# Patient Record
Sex: Male | Born: 1980 | Race: Black or African American | Hispanic: No | Smoking: Current every day smoker
Health system: Southern US, Community
[De-identification: ages and names within clinical notes are randomized; demographics above are authoritative.]

## PROBLEM LIST (undated history)

## (undated) DIAGNOSIS — F329 Major depressive disorder, single episode, unspecified: Secondary | ICD-10-CM

## (undated) DIAGNOSIS — F32A Depression, unspecified: Secondary | ICD-10-CM

## (undated) DIAGNOSIS — IMO0002 Reserved for concepts with insufficient information to code with codable children: Secondary | ICD-10-CM

## (undated) DIAGNOSIS — Z8614 Personal history of Methicillin resistant Staphylococcus aureus infection: Secondary | ICD-10-CM

## (undated) DIAGNOSIS — G4730 Sleep apnea, unspecified: Secondary | ICD-10-CM

## (undated) DIAGNOSIS — Z87898 Personal history of other specified conditions: Secondary | ICD-10-CM

## (undated) DIAGNOSIS — E1165 Type 2 diabetes mellitus with hyperglycemia: Secondary | ICD-10-CM

## (undated) DIAGNOSIS — S61219A Laceration without foreign body of unspecified finger without damage to nail, initial encounter: Secondary | ICD-10-CM

## (undated) DIAGNOSIS — G43909 Migraine, unspecified, not intractable, without status migrainosus: Secondary | ICD-10-CM

## (undated) DIAGNOSIS — K219 Gastro-esophageal reflux disease without esophagitis: Secondary | ICD-10-CM

## (undated) DIAGNOSIS — I10 Essential (primary) hypertension: Secondary | ICD-10-CM

## (undated) DIAGNOSIS — L309 Dermatitis, unspecified: Secondary | ICD-10-CM

## (undated) HISTORY — DX: Essential (primary) hypertension: I10

## (undated) HISTORY — DX: Type 2 diabetes mellitus with hyperglycemia: E11.65

## (undated) HISTORY — DX: Reserved for concepts with insufficient information to code with codable children: IMO0002

## (undated) HISTORY — DX: Migraine, unspecified, not intractable, without status migrainosus: G43.909

## (undated) HISTORY — DX: Major depressive disorder, single episode, unspecified: F32.9

## (undated) HISTORY — DX: Gastro-esophageal reflux disease without esophagitis: K21.9

## (undated) HISTORY — DX: Depression, unspecified: F32.A

---

## 2001-01-07 HISTORY — PX: ORBITAL FRACTURE SURGERY: SHX725

## 2012-07-08 ENCOUNTER — Encounter (HOSPITAL_COMMUNITY): Payer: Self-pay | Admitting: Emergency Medicine

## 2012-07-08 ENCOUNTER — Emergency Department (INDEPENDENT_AMBULATORY_CARE_PROVIDER_SITE_OTHER)
Admission: EM | Admit: 2012-07-08 | Discharge: 2012-07-08 | Disposition: A | Discharge status: Home / Self Care | Payer: Self-pay | Source: Home / Self Care | Attending: Emergency Medicine | Admitting: Emergency Medicine

## 2012-07-08 DIAGNOSIS — K529 Noninfective gastroenteritis and colitis, unspecified: Secondary | ICD-10-CM

## 2012-07-08 DIAGNOSIS — K5289 Other specified noninfective gastroenteritis and colitis: Secondary | ICD-10-CM

## 2012-07-08 HISTORY — DX: Sleep apnea, unspecified: G47.30

## 2012-07-08 LAB — POCT I-STAT, CHEM 8
BUN: 11 mg/dL (ref 6–23)
Calcium, Ion: 1.19 mmol/L (ref 1.12–1.23)
Chloride: 102 mEq/L (ref 96–112)
Creatinine, Ser: 1.2 mg/dL (ref 0.50–1.35)
Glucose, Bld: 110 mg/dL — ABNORMAL HIGH (ref 70–99)

## 2012-07-08 LAB — CBC WITH DIFFERENTIAL/PLATELET
Eosinophils Relative: 4 % (ref 0–5)
HCT: 44.7 % (ref 39.0–52.0)
Hemoglobin: 15.2 g/dL (ref 13.0–17.0)
Lymphocytes Relative: 40 % (ref 12–46)
MCV: 87.1 fL (ref 78.0–100.0)
Monocytes Absolute: 0.4 10*3/uL (ref 0.1–1.0)
Monocytes Relative: 6 % (ref 3–12)
Neutro Abs: 3.6 10*3/uL (ref 1.7–7.7)
WBC: 7 10*3/uL (ref 4.0–10.5)

## 2012-07-08 LAB — POCT RAPID STREP A: Streptococcus, Group A Screen (Direct): NEGATIVE

## 2012-07-08 MED ORDER — AZITHROMYCIN 250 MG PO TABS: 1000.0000 mg | ORAL_TABLET | Freq: Once | ORAL | Status: DC

## 2012-07-08 MED ORDER — CEFTRIAXONE SODIUM 250 MG IJ SOLR: 500.0000 mg | Freq: Once | INTRAMUSCULAR | Status: DC

## 2012-07-08 MED ORDER — DIPHENOXYLATE-ATROPINE 2.5-0.025 MG PO TABS: 1.0000 | ORAL_TABLET | Freq: Four times a day (QID) | ORAL | Status: DC | PRN

## 2012-07-08 MED ORDER — ACETAMINOPHEN-CODEINE #3 300-30 MG PO TABS: 1.0000 | ORAL_TABLET | Freq: Four times a day (QID) | ORAL | Status: DC | PRN

## 2012-07-08 NOTE — ED Provider Notes (Signed)
History    CSN: [id number] Arrival date & time 07/08/12  7829  First MD Initiated Contact with Patient 07/08/12 1105     Chief Complaint  Patient presents with  . Facial Pain   (Consider location/radiation/quality/duration/timing/severity/associated sxs/prior Treatment) HPI Comments: Patient presents urgent care describing that since Wednesday he's been having a sore throat. Has been feeling increasingly tired with bodyaches and yesterday had a single episode of explosive liquidy diarrhea. This is his has had some upper abdominal discomfort.  Patient denies any cough, shortness of breath or chest pain. He does however describe a minimal runny or congested nose. Patient denies any abdominal pain at rest.  Patient is a 32 y.o. male presenting with pharyngitis.  Sore Throat This is a new problem. The problem occurs constantly. The problem has not changed since onset.Associated symptoms include abdominal pain. Pertinent negatives include no chest pain, no headaches and no shortness of breath. The symptoms are aggravated by swallowing. Nothing relieves the symptoms. He has tried nothing for the symptoms.   Past Medical History  Diagnosis Date  . Sleep apnea    Past Surgical History  Procedure Laterality Date  . Orbital fracture surgery     History reviewed. No pertinent family history. History  Substance Use Topics  . Smoking status: Not on file  . Smokeless tobacco: Not on file  . Alcohol Use: Not on file    Review of Systems  Constitutional: Positive for chills, appetite change and fatigue. Negative for activity change.  HENT: Positive for congestion and sore throat. Negative for facial swelling, rhinorrhea, trouble swallowing, neck pain, neck stiffness, voice change and postnasal drip.   Respiratory: Negative for cough and shortness of breath.   Cardiovascular: Negative for chest pain, palpitations and leg swelling.  Gastrointestinal: Positive for abdominal pain and diarrhea.  Negative for vomiting and rectal pain.  Genitourinary: Negative for dysuria.  Skin: Negative for rash.  Neurological: Negative for headaches.    Allergies  Shellfish allergy  Home Medications   Current Outpatient Rx  Name  Route  Sig  Dispense  Refill  . acetaminophen-codeine (TYLENOL #3) 300-30 MG per tablet   Oral   Take 1-2 tablets by mouth every 6 (six) hours as needed for pain.   15 tablet   0   . diphenoxylate-atropine (LOMOTIL) 2.5-0.025 MG per tablet   Oral   Take 1 tablet by mouth 4 (four) times daily as needed for diarrhea or loose stools.   15 tablet   0    BP 137/100  Pulse 69  Temp(Src) 97.8 F (36.6 C) (Oral)  Resp 20  SpO2 99% Physical Exam  Nursing note and vitals reviewed. Constitutional: Vital signs are normal. He appears well-developed and well-nourished.  Non-toxic appearance.  HENT:  Head: Normocephalic.  Mouth/Throat: Uvula is midline. Posterior oropharyngeal erythema present. No oropharyngeal exudate, posterior oropharyngeal edema or tonsillar abscesses.  Eyes: Conjunctivae are normal.  Neck: Neck supple. No JVD present.  Cardiovascular: Normal rate and regular rhythm.  Exam reveals no gallop and no friction rub.   No murmur heard. Pulmonary/Chest: Effort normal and breath sounds normal. He has no decreased breath sounds. He has no wheezes. He has no rhonchi. He has no rales.  Abdominal: Soft. Normal appearance. There is no hepatosplenomegaly. There is no tenderness. There is no rigidity, no rebound, no CVA tenderness, no tenderness at McBurney's point and negative Murphy's sign.  Musculoskeletal: Normal range of motion.  Neurological: He is alert.  Skin: No rash noted. No  erythema.    ED Course  Procedures (including critical care time) Labs Reviewed  POCT I-STAT, CHEM 8 - Abnormal; Notable for the following:    Glucose, Bld 110 (*)    All other components within normal limits  CULTURE, GROUP A STREP  CBC WITH DIFFERENTIAL  POCT RAPID  STREP A ([facility] ONLY)   No results found. 1. Gastroenteritis     MDM  Patient afebrile, looks comfortable. Normal electrolytes. Symptomatic management encourage for the next 48 hours. Patient provided with a prescription of Zofran and Lomotil. Instructed and discussed symptoms that should return for further evaluation.  Jimmie Molly, MD 07/08/12 [phone]

## 2012-07-08 NOTE — ED Notes (Signed)
Triaged by tami Dora Sims student

## 2012-07-08 NOTE — ED Notes (Signed)
C/o runny nose, sore throat, tired and weak, had explosive diarrhea last night and this morning, Pt denies any fever.  Has had some cold chills and mild stomach pain.  Had tried OTC meds with no relief Leilani Able CMA Student

## 2012-07-10 LAB — CULTURE, GROUP A STREP

## 2013-08-17 ENCOUNTER — Ambulatory Visit (INDEPENDENT_AMBULATORY_CARE_PROVIDER_SITE_OTHER): Payer: BC Managed Care – PPO | Admitting: Physician Assistant

## 2013-08-17 ENCOUNTER — Encounter: Payer: Self-pay | Admitting: Physician Assistant

## 2013-08-17 VITALS — BP 120/80 | HR 72 | Temp 98.9°F | Resp 18 | Ht 69.5 in | Wt 295.0 lb

## 2013-08-17 DIAGNOSIS — R7309 Other abnormal glucose: Secondary | ICD-10-CM

## 2013-08-17 DIAGNOSIS — I10 Essential (primary) hypertension: Secondary | ICD-10-CM

## 2013-08-17 DIAGNOSIS — Z0000 Encounter for general adult medical examination without abnormal findings: Secondary | ICD-10-CM

## 2013-08-17 DIAGNOSIS — R739 Hyperglycemia, unspecified: Secondary | ICD-10-CM

## 2013-08-17 LAB — LIPID PANEL
CHOL/HDL RATIO: 6
Cholesterol: 214 mg/dL — ABNORMAL HIGH (ref 0–200)
HDL: 37.2 mg/dL — AB (ref >39.00)
LDL Cholesterol: 151 mg/dL — ABNORMAL HIGH (ref 0–99)
NONHDL: 176.8
TRIGLYCERIDES: 130 mg/dL (ref 0.0–149.0)
VLDL: 26 mg/dL (ref 0.0–40.0)

## 2013-08-17 LAB — HEPATIC FUNCTION PANEL
ALT: 23 U/L (ref 0–53)
AST: 17 U/L (ref 0–37)
Albumin: 4.1 g/dL (ref 3.5–5.2)
Alkaline Phosphatase: 54 U/L (ref 39–117)
Bilirubin, Direct: 0 mg/dL (ref 0.0–0.3)
Total Bilirubin: 0.7 mg/dL (ref 0.2–1.2)
Total Protein: 7.9 g/dL (ref 6.0–8.3)

## 2013-08-17 LAB — POCT URINALYSIS DIPSTICK
BILIRUBIN UA: NEGATIVE
GLUCOSE UA: NEGATIVE
Ketones, UA: NEGATIVE
Leukocytes, UA: NEGATIVE
NITRITE UA: NEGATIVE
Protein, UA: NEGATIVE
Spec Grav, UA: <=1.005
UROBILINOGEN UA: 0.2
pH, UA: 5.5

## 2013-08-17 LAB — CBC WITH DIFFERENTIAL/PLATELET
BASOS ABS: 0 10*3/uL (ref 0.0–0.1)
Basophils Relative: 0.4 % (ref 0.0–3.0)
EOS ABS: 0.2 10*3/uL (ref 0.0–0.7)
Eosinophils Relative: 2.9 % (ref 0.0–5.0)
HCT: 46.7 % (ref 39.0–52.0)
Hemoglobin: 15.5 g/dL (ref 13.0–17.0)
LYMPHS PCT: 26.3 % (ref 12.0–46.0)
Lymphs Abs: 1.9 10*3/uL (ref 0.7–4.0)
MCHC: 33.3 g/dL (ref 30.0–36.0)
MCV: 90.4 fl (ref 78.0–100.0)
MONO ABS: 0.5 10*3/uL (ref 0.1–1.0)
Monocytes Relative: 6.6 % (ref 3.0–12.0)
NEUTROS PCT: 63.8 % (ref 43.0–77.0)
Neutro Abs: 4.7 10*3/uL (ref 1.4–7.7)
Platelets: 422 10*3/uL — ABNORMAL HIGH (ref 150.0–400.0)
RBC: 5.16 Mil/uL (ref 4.22–5.81)
RDW: 13.4 % (ref 11.5–15.5)
WBC: 7.3 10*3/uL (ref 4.0–10.5)

## 2013-08-17 LAB — BASIC METABOLIC PANEL WITH GFR
BUN: 12 mg/dL (ref 6–23)
CO2: 27 meq/L (ref 19–32)
Calcium: 9.4 mg/dL (ref 8.4–10.5)
Chloride: 99 meq/L (ref 96–112)
Creatinine, Ser: 1.3 mg/dL (ref 0.4–1.5)
GFR: 83.88 mL/min
Glucose, Bld: 145 mg/dL — ABNORMAL HIGH (ref 70–99)
Potassium: 3.9 meq/L (ref 3.5–5.1)
Sodium: 135 meq/L (ref 135–145)

## 2013-08-17 LAB — GLUCOSE, POCT (MANUAL RESULT ENTRY): POC Glucose: 140 mg/dl — AB (ref 70–99)

## 2013-08-17 NOTE — Progress Notes (Signed)
Subjective:    Patient ID: Joseph Barry, male    DOB: 10-30-1980, 33 y.o.   MRN: [id number]  HPI Patient presents to clinic today to establish care.  Acute Concerns: Pt establishing because of recent UC visit where he was discovered to have an elevated blood sugar of 169 last week. He was told to find a PCP to have this monitored more closely.  Annual Exam.  Chronic Issues: HTN- Stable on lisinopril-hctz. He is tolerating this medication well and denies adverse effects.  Headaches, migraines- States he has had his whole life. Unilateral headache. States he also experiences photophobia ad phonophobia. In the past he has even vomited from these. He states that he used to take tylenol for this, however it is not effective. He gets these maybe once a month. He states that they resolve with adequate rest.  Health Maintenance: Dental -- Has dentist in [place]. Vision -- Rx glasses, has eye exam yearly. Immunizations -- Needs Tetanus. Colonoscopy -- No family hx of colon cancer.    Review of Systems Patient denies fevers, chills, nausea, vomiting, diarrhea, chest pain, shortness of breath, orthopnea, syncope. Denies lower extremity edema, abdominal pain, change in appetite, change in bowel movements. Patient denies rashes, musculoskeletal complaints. No other specific complaints in a complete review of systems.   Past Medical History  Diagnosis Date  . Sleep apnea   . Chicken pox   . Frequent headaches   . GERD (gastroesophageal reflux disease)   . Heart murmur   . Hypertension   . Migraines     History   Social History  . Marital Status: Married    Spouse Name: N/A    Number of Children: N/A  . Years of Education: N/A   Occupational History  . Not on file.   Social History Main Topics  . Smoking status: Current Some Day Smoker    Types: Cigars  . Smokeless tobacco: Not on file  . Alcohol Use: Yes     Comment: occ  . Drug Use: No  . Sexual Activity: Not on  file   Other Topics Concern  . Not on file   Social History Narrative  . No narrative on file    Past Surgical History  Procedure Laterality Date  . Orbital fracture surgery      Family History  Problem Relation Age of Onset  . Arthritis Maternal Grandmother   . Hypertension Father   . Hypertension Mother   . Diabetes Father   . Heart disease Mother     Allergies  Allergen Reactions  . Shellfish Allergy     No current outpatient prescriptions on file prior to visit.   No current facility-administered medications on file prior to visit.    EXAM: BP 120/80  Pulse 72  Temp(Src) 98.9 F (37.2 C) (Oral)  Resp 18  Ht 5' 9.5" (1.765 m)  Wt 295 lb (133.811 kg)  BMI 42.95 kg/m2     Objective:   Physical Exam  Nursing note and vitals reviewed. Constitutional: He is oriented to person, place, and time. He appears well-developed and well-nourished. No distress.  HENT:  Head: Normocephalic and atraumatic.  Right Ear: External ear normal.  Left Ear: External ear normal.  Nose: Nose normal.  Mouth/Throat: Oropharynx is clear and moist. No oropharyngeal exudate.  bliat TMs normal.  Eyes: Conjunctivae and EOM are normal. Pupils are equal, round, and reactive to light. Right eye exhibits no discharge. Left eye exhibits no discharge. No scleral icterus.  Neck: Normal range of motion. Neck supple. No JVD present. No tracheal deviation present. No thyromegaly present.  Cardiovascular: Normal rate, regular rhythm, normal heart sounds and intact distal pulses.  Exam reveals no gallop and no friction rub.   No murmur heard. Pulmonary/Chest: Effort normal and breath sounds normal. No stridor. No respiratory distress. He has no wheezes. He has no rales. He exhibits no tenderness.  Abdominal: Soft. Bowel sounds are normal. He exhibits no distension and no mass. There is no tenderness. There is no rebound and no guarding.  Musculoskeletal: Normal range of motion. He exhibits no edema  and no tenderness.  Lymphadenopathy:    He has no cervical adenopathy.  Neurological: He is alert and oriented to person, place, and time. He has normal reflexes. No cranial nerve deficit. He exhibits normal muscle tone. Coordination normal.  Skin: Skin is warm and dry. No rash noted. He is not diaphoretic. No erythema. No pallor.  Psychiatric: He has a normal mood and affect. His behavior is normal. Judgment and thought content normal.   Component     Latest Ref Rng 08/17/2013  POC Glucose     70 - 99 mg/dl 161140 (A)       Assessment & Plan:  Casimiro NeedleMichael was seen today for establish care.  Diagnoses and associated orders for this visit:  Annual physical exam - CBC with Differential - Lipid Panel - POC Urinalysis Dipstick - Basic Metabolic Panel - Hepatic Function Panel  Essential hypertension Comments: Stable on Lisinopril-HCTZ, will continue.  Hyperglycemia - POC Glucose (CBG)    Pt fasting Fingerstick blood glucose today elevated and recent a1c of 6.5% suggest that there is some underlying sugar problem. We will try to treat this primarily with focus on lifestyle modification, healthy, varied diet, and regular exercise.  Health Maintenance UTD.  Return precautions provided, and patient handout on health maintenance, diabetes and exercise, diabetes and food.  Plan to follow up in about 3 months to reassess, or for worsening or persistent symptoms despite treatment.  Patient Instructions  We will call with your lab results and any changes in therapy required based on them.  We will need to try and make lifestyle changes to monitor your sugar as you are likely pre-diabetic.   Make sure you limit your sugar intake and carbs and try and get regular exercise several times per week.  We will follow up on your elevated blood sugars in 3 months with a reassessment of your A1c.  If we are unable to control your blood sugar with diet and exercise, we will have to start medication  to treat.  If emergency symptoms discussed during visit developed, seek medical attention immediately.  Followup as needed, or for worsening or persistent symptoms despite treatment.

## 2013-08-17 NOTE — Patient Instructions (Addendum)
We will call with your lab results and any changes in therapy required based on them.  We will need to try and make lifestyle changes to monitor your sugar as you are likely pre-diabetic.   Make sure you limit your sugar intake and carbs and try and get regular exercise several times per week.  We will follow up on your elevated blood sugars in 3 months with a reassessment of your A1c.  If we are unable to control your blood sugar with diet and exercise, we will have to start medication to treat.  If emergency symptoms discussed during visit developed, seek medical attention immediately.  Followup as needed, or for worsening or persistent symptoms despite treatment.   Diabetes Mellitus and Food It is important for you to manage your blood sugar (glucose) level. Your blood glucose level can be greatly affected by what you eat. Eating healthier foods in the appropriate amounts throughout the day at about the same time each day will help you control your blood glucose level. It can also help slow or prevent worsening of your diabetes mellitus. Healthy eating may even help you improve the level of your blood pressure and reach or maintain a healthy weight.  HOW CAN FOOD AFFECT ME? Carbohydrates Carbohydrates affect your blood glucose level more than any other type of food. Your dietitian will help you determine how many carbohydrates to eat at each meal and teach you how to count carbohydrates. Counting carbohydrates is important to keep your blood glucose at a healthy level, especially if you are using insulin or taking certain medicines for diabetes mellitus. Alcohol Alcohol can cause sudden decreases in blood glucose (hypoglycemia), especially if you use insulin or take certain medicines for diabetes mellitus. Hypoglycemia can be a life-threatening condition. Symptoms of hypoglycemia (sleepiness, dizziness, and disorientation) are similar to symptoms of having too much alcohol.  If your health  care provider has given you approval to drink alcohol, do so in moderation and use the following guidelines:  Women should not have more than one drink per day, and men should not have more than two drinks per day. One drink is equal to:  12 oz of beer.  5 oz of wine.  1 oz of hard liquor.  Do not drink on an empty stomach.  Keep yourself hydrated. Have water, diet soda, or unsweetened iced tea.  Regular soda, juice, and other mixers might contain a lot of carbohydrates and should be counted. WHAT FOODS ARE NOT RECOMMENDED? As you make food choices, it is important to remember that all foods are not the same. Some foods have fewer nutrients per serving than other foods, even though they might have the same number of calories or carbohydrates. It is difficult to get your body what it needs when you eat foods with fewer nutrients. Examples of foods that you should avoid that are high in calories and carbohydrates but low in nutrients include:  Trans fats (most processed foods list trans fats on the Nutrition Facts label).  Regular soda.  Juice.  Candy.  Sweets, such as cake, pie, doughnuts, and cookies.  Fried foods. WHAT FOODS CAN I EAT? Have nutrient-rich foods, which will nourish your body and keep you healthy. The food you should eat also will depend on several factors, including:  The calories you need.  The medicines you take.  Your weight.  Your blood glucose level.  Your blood pressure level.  Your cholesterol level. You also should eat a variety of foods, including:  Protein, such as meat, poultry, fish, tofu, nuts, and seeds (lean animal proteins are best).  Fruits.  Vegetables.  Dairy products, such as milk, cheese, and yogurt (low fat is best).  Breads, grains, pasta, cereal, rice, and beans.  Fats such as olive oil, trans fat-free margarine, canola oil, avocado, and olives. DOES EVERYONE WITH DIABETES MELLITUS HAVE THE SAME MEAL PLAN? Because every  person with diabetes mellitus is different, there is not one meal plan that works for everyone. It is very important that you meet with a dietitian who will help you create a meal plan that is just right for you. Document Released: 09/20/2004 Document Revised: 12/29/2012 Document Reviewed: 11/20/2012 [facility] Patient Information 2015 [place], [place]. This information is not intended to replace advice given to you by your health care provider. Make sure you discuss any questions you have with your health care provider. Diabetes and Exercise Exercising regularly is important. It is not just about losing weight. It has many health benefits, such as:  Improving your overall fitness, flexibility, and endurance.  Increasing your bone density.  Helping with weight control.  Decreasing your body fat.  Increasing your muscle strength.  Reducing stress and tension.  Improving your overall health. People with diabetes who exercise gain additional benefits because exercise:  Reduces appetite.  Improves the body's use of blood sugar (glucose).  Helps lower or control blood glucose.  Decreases blood pressure.  Helps control blood lipids (such as cholesterol and triglycerides).  Improves the body's use of the hormone insulin by:  Increasing the body's insulin sensitivity.  Reducing the body's insulin needs.  Decreases the risk for heart disease because exercising:  Lowers cholesterol and triglycerides levels.  Increases the levels of good cholesterol (such as high-density lipoproteins [HDL]) in the body.  Lowers blood glucose levels. YOUR ACTIVITY PLAN  Choose an activity that you enjoy and set realistic goals. Your health care provider or diabetes educator can help you make an activity plan that works for you. Exercise regularly as directed by your health care provider. This includes:  Performing resistance training twice a week such as push-ups, sit-ups, lifting weights, or using  resistance bands.  Performing 150 minutes of cardio exercises each week such as walking, running, or playing sports.  Staying active and spending no more than 90 minutes at one time being inactive. Even short bursts of exercise are good for you. Three 10-minute sessions spread throughout the day are just as beneficial as a single 30-minute session. Some exercise ideas include:  Taking the dog for a walk.  Taking the stairs instead of the elevator.  Dancing to your favorite song.  Doing an exercise video.  Doing your favorite exercise with a friend. RECOMMENDATIONS FOR EXERCISING WITH TYPE 1 OR TYPE 2 DIABETES   Check your blood glucose before exercising. If blood glucose levels are greater than 240 mg/dL, check for urine ketones. Do not exercise if ketones are present.  Avoid injecting insulin into areas of the body that are going to be exercised. For example, avoid injecting insulin into:  The arms when playing tennis.  The legs when jogging.  Keep a record of:  Food intake before and after you exercise.  Expected peak times of insulin action.  Blood glucose levels before and after you exercise.  The type and amount of exercise you have done.  Review your records with your health care provider. Your health care provider will help you to develop guidelines for adjusting food intake and insulin amounts  before and after exercising.  If you take insulin or oral hypoglycemic agents, watch for signs and symptoms of hypoglycemia. They include:  Dizziness.  Shaking.  Sweating.  Chills.  Confusion.  Drink plenty of water while you exercise to prevent dehydration or heat stroke. Body water is lost during exercise and must be replaced.  Talk to your health care provider before starting an exercise program to make sure it is safe for you. Remember, almost any type of activity is better than none. Document Released: 03/16/2003 Document Revised: 05/10/2013 Document Reviewed:  06/02/2012 [facility] Patient Information 2015 [place], [place]. This information is not intended to replace advice given to you by your health care provider. Make sure you discuss any questions you have with your health care provider. Health Maintenance A healthy lifestyle and preventative care can promote health and wellness.  Maintain regular health, dental, and eye exams.  Eat a healthy diet. Foods like vegetables, fruits, whole grains, low-fat dairy products, and lean protein foods contain the nutrients you need and are low in calories. Decrease your intake of foods high in solid fats, added sugars, and salt. Get information about a proper diet from your health care provider, if necessary.  Regular physical exercise is one of the most important things you can do for your health. Most adults should get at least 150 minutes of moderate-intensity exercise (any activity that increases your heart rate and causes you to sweat) each week. In addition, most adults need muscle-strengthening exercises on 2 or more days a week.   Maintain a healthy weight. The body mass index (BMI) is a screening tool to identify possible weight problems. It provides an estimate of body fat based on height and weight. Your health care provider can find your BMI and can help you achieve or maintain a healthy weight. For males 20 years and older:  A BMI below 18.5 is considered underweight.  A BMI of 18.5 to 24.9 is normal.  A BMI of 25 to 29.9 is considered overweight.  A BMI of 30 and above is considered obese.  Maintain normal blood lipids and cholesterol by exercising and minimizing your intake of saturated fat. Eat a balanced diet with plenty of fruits and vegetables. Blood tests for lipids and cholesterol should begin at age 33 and be repeated every 5 years. If your lipid or cholesterol levels are high, you are over age 950, or you are at high risk for heart disease, you may need your cholesterol levels checked more  frequently.Ongoing high lipid and cholesterol levels should be treated with medicines if diet and exercise are not working.  If you smoke, find out from your health care provider how to quit. If you do not use tobacco, do not start.  Lung cancer screening is recommended for adults aged 55-80 years who are at high risk for developing lung cancer because of a history of smoking. A yearly low-dose CT scan of the lungs is recommended for people who have at least a 30-pack-year history of smoking and are current smokers or have quit within the past 15 years. A pack year of smoking is smoking an average of 1 pack of cigarettes a day for 1 year (for example, a 30-pack-year history of smoking could mean smoking 1 pack a day for 30 years or 2 packs a day for 15 years). Yearly screening should continue until the smoker has stopped smoking for at least 15 years. Yearly screening should be stopped for people who develop a health problem  that would prevent them from having lung cancer treatment.  If you choose to drink alcohol, do not have more than 2 drinks per day. One drink is considered to be 12 oz (360 mL) of beer, 5 oz (150 mL) of wine, or 1.5 oz (45 mL) of liquor.  Avoid the use of street drugs. Do not share needles with anyone. Ask for help if you need support or instructions about stopping the use of drugs.  High blood pressure causes heart disease and increases the risk of stroke. Blood pressure should be checked at least every 1-2 years. Ongoing high blood pressure should be treated with medicines if weight loss and exercise are not effective.  If you are 24-11 years old, ask your health care provider if you should take aspirin to prevent heart disease.  Diabetes screening involves taking a blood sample to check your fasting blood sugar level. This should be done once every 3 years after age 22 if you are at a normal weight and without risk factors for diabetes. Testing should be considered at a younger  age or be carried out more frequently if you are overweight and have at least 1 risk factor for diabetes.  Colorectal cancer can be detected and often prevented. Most routine colorectal cancer screening begins at the age of 97 and continues through age 49. However, your health care provider may recommend screening at an earlier age if you have risk factors for colon cancer. On a yearly basis, your health care provider may provide home test kits to check for hidden blood in the stool. A small camera at the end of a tube may be used to directly examine the colon (sigmoidoscopy or colonoscopy) to detect the earliest forms of colorectal cancer. Talk to your health care provider about this at age 52 when routine screening begins. A direct exam of the colon should be repeated every 5-10 years through age 20, unless early forms of precancerous polyps or small growths are found.  People who are at an increased risk for hepatitis B should be screened for this virus. You are considered at high risk for hepatitis B if:  You were born in a country where hepatitis B occurs often. Talk with your health care provider about which countries are considered high risk.  Your parents were born in a high-risk country and you have not received a shot to protect against hepatitis B (hepatitis B vaccine).  You have HIV or AIDS.  You use needles to inject street drugs.  You live with, or have sex with, someone who has hepatitis B.  You are a man who has sex with other men (MSM).  You get hemodialysis treatment.  You take certain medicines for conditions like cancer, organ transplantation, and autoimmune conditions.  Hepatitis C blood testing is recommended for all people born from [birth year] through 1965 and any individual with known risk factors for hepatitis C.  Healthy men should no longer receive prostate-specific antigen (PSA) blood tests as part of routine cancer screening. Talk to your health care provider about  prostate cancer screening.  Testicular cancer screening is not recommended for adolescents or adult males who have no symptoms. Screening includes self-exam, a health care provider exam, and other screening tests. Consult with your health care provider about any symptoms you have or any concerns you have about testicular cancer.  Practice safe sex. Use condoms and avoid high-risk sexual practices to reduce the spread of sexually transmitted infections (STIs).  You should be  screened for STIs, including gonorrhea and chlamydia if:  You are sexually active and are younger than 24 years.  You are older than 24 years, and your health care provider tells you that you are at risk for this type of infection.  Your sexual activity has changed since you were last screened, and you are at an increased risk for chlamydia or gonorrhea. Ask your health care provider if you are at risk.  If you are at risk of being infected with HIV, it is recommended that you take a prescription medicine daily to prevent HIV infection. This is called pre-exposure prophylaxis (PrEP). You are considered at risk if:  You are a man who has sex with other men (MSM).  You are a heterosexual man who is sexually active with multiple partners.  You take drugs by injection.  You are sexually active with a partner who has HIV.  Talk with your health care provider about whether you are at high risk of being infected with HIV. If you choose to begin PrEP, you should first be tested for HIV. You should then be tested every 3 months for as long as you are taking PrEP.  Use sunscreen. Apply sunscreen liberally and repeatedly throughout the day. You should seek shade when your shadow is shorter than you. Protect yourself by wearing long sleeves, pants, a wide-brimmed hat, and sunglasses year round whenever you are outdoors.  Tell your health care provider of new moles or changes in moles, especially if there is a change in shape or  color. Also, tell your health care provider if a mole is larger than the size of a pencil eraser.  A one-time screening for abdominal aortic aneurysm (AAA) and surgical repair of large AAAs by ultrasound is recommended for men aged 65-75 years who are current or former smokers.  Stay current with your vaccines (immunizations). Document Released: 06/22/2007 Document Revised: 12/29/2012 Document Reviewed: 05/21/2010 [facility] Patient Information 2015 [place], [place]. This information is not intended to replace advice given to you by your health care provider. Make sure you discuss any questions you have with your health care provider.

## 2013-08-18 ENCOUNTER — Telehealth: Payer: Self-pay | Admitting: Physician Assistant

## 2013-08-18 ENCOUNTER — Other Ambulatory Visit: Payer: Self-pay | Admitting: Physician Assistant

## 2013-08-18 DIAGNOSIS — R319 Hematuria, unspecified: Secondary | ICD-10-CM

## 2013-08-18 NOTE — Telephone Encounter (Signed)
Relevant patient education assigned to patient using Emmi. ° °

## 2013-08-20 ENCOUNTER — Other Ambulatory Visit: Payer: Self-pay

## 2013-08-20 DIAGNOSIS — R319 Hematuria, unspecified: Secondary | ICD-10-CM

## 2013-08-23 ENCOUNTER — Other Ambulatory Visit (INDEPENDENT_AMBULATORY_CARE_PROVIDER_SITE_OTHER): Payer: BC Managed Care – PPO

## 2013-08-23 DIAGNOSIS — R319 Hematuria, unspecified: Secondary | ICD-10-CM

## 2013-08-23 LAB — URINALYSIS, ROUTINE W REFLEX MICROSCOPIC
BILIRUBIN URINE: NEGATIVE
KETONES UR: NEGATIVE
LEUKOCYTES UA: NEGATIVE
Nitrite: NEGATIVE
PH: 5.5 (ref 5.0–8.0)
SPECIFIC GRAVITY, URINE: 1.025 (ref 1.000–1.030)
TOTAL PROTEIN, URINE-UPE24: NEGATIVE
URINE GLUCOSE: NEGATIVE
Urobilinogen, UA: 0.2 (ref 0.0–1.0)

## 2013-11-16 ENCOUNTER — Ambulatory Visit: Payer: BC Managed Care – PPO | Admitting: Physician Assistant

## 2014-01-17 ENCOUNTER — Ambulatory Visit (INDEPENDENT_AMBULATORY_CARE_PROVIDER_SITE_OTHER): Payer: 59 | Admitting: Family Medicine

## 2014-01-17 ENCOUNTER — Encounter: Payer: Self-pay | Admitting: Family Medicine

## 2014-01-17 VITALS — BP 130/80 | HR 64 | Temp 98.0°F | Wt 281.0 lb

## 2014-01-17 DIAGNOSIS — I10 Essential (primary) hypertension: Secondary | ICD-10-CM

## 2014-01-17 DIAGNOSIS — M25531 Pain in right wrist: Secondary | ICD-10-CM

## 2014-01-17 DIAGNOSIS — M25532 Pain in left wrist: Secondary | ICD-10-CM

## 2014-01-17 DIAGNOSIS — IMO0002 Reserved for concepts with insufficient information to code with codable children: Secondary | ICD-10-CM | POA: Insufficient documentation

## 2014-01-17 DIAGNOSIS — E1165 Type 2 diabetes mellitus with hyperglycemia: Secondary | ICD-10-CM | POA: Insufficient documentation

## 2014-01-17 DIAGNOSIS — R7303 Prediabetes: Secondary | ICD-10-CM

## 2014-01-17 DIAGNOSIS — R7309 Other abnormal glucose: Secondary | ICD-10-CM

## 2014-01-17 LAB — BASIC METABOLIC PANEL
BUN: 9 mg/dL (ref 6–23)
CO2: 30 meq/L (ref 19–32)
Calcium: 9.5 mg/dL (ref 8.4–10.5)
Chloride: 104 mEq/L (ref 96–112)
Creatinine, Ser: 1.1 mg/dL (ref 0.4–1.5)
GFR: 97.73 mL/min (ref >60.00)
Glucose, Bld: 92 mg/dL (ref 70–99)
Potassium: 5 mEq/L (ref 3.5–5.1)
SODIUM: 139 meq/L (ref 135–145)

## 2014-01-17 LAB — HEMOGLOBIN A1C: HEMOGLOBIN A1C: 7.2 % — AB (ref 4.6–6.5)

## 2014-01-17 NOTE — Progress Notes (Signed)
   Subjective:    Patient ID: Joseph Barry, male    DOB: May 11, 1980, 34 y.o.   MRN: [id number]  HPI Patient seen for the following issues  At least 5 year history of bilateral wrist pain. He states [place] he was working for a Field seismologistfurniture moving company. He was carrying a heavy couch and his helper dropped their end suddenly. He had bilateral wrist pain. 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  He has had persistent pain since then. He apparently [place] but at the time states he had poor insurance coverage and elected against any, surgery. 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 He is right-hand dominant  History of prediabetes. Father with type 2 diabetes history. Requesting blood sugar assessment. No polyuria or polydipsia. He has made some recent positive dietary changes.  Past Medical History  Diagnosis Date  . Sleep apnea   . Chicken pox   . Frequent headaches   . GERD (gastroesophageal reflux disease)   . Heart murmur   . Hypertension   . Migraines    Past Surgical History  Procedure Laterality Date  . Orbital fracture surgery      reports that he has been smoking Cigars.  He does not have any smokeless tobacco history on file. He reports that he drinks alcohol. He reports that he does not use illicit drugs. family history includes Arthritis in his maternal grandmother; Diabetes in his father; Heart disease in his mother; Hypertension in his father and mother. Allergies  Allergen Reactions  . Shellfish Allergy   '    Review of Systems  Constitutional: Negative for fever, chills and fatigue.  Eyes: Negative for visual disturbance.  Respiratory: Negative for cough, chest tightness and shortness of breath.     Cardiovascular: Negative for chest pain, palpitations and leg swelling.  Endocrine: Negative for polydipsia and polyuria.  Neurological: Negative for dizziness, syncope, weakness, light-headedness and headaches.       Objective:   Physical Exam  Constitutional: He is oriented to person, place, and time. He appears well-developed and well-nourished.  HENT:  Right Ear: External ear normal.  Left Ear: External ear normal.  Mouth/Throat: Oropharynx is clear and moist.  Eyes: Pupils are equal, round, and reactive to light.  Neck: Neck supple. No thyromegaly present.  Cardiovascular: Normal rate and regular rhythm.   Pulmonary/Chest: Effort normal and breath sounds normal. No respiratory distress. He has no wheezes. He has no rales.  Musculoskeletal: He exhibits no edema.  Wrists - good range of motion. No localized tenderness. No warmth. No erythema.  Neurological: He is alert and oriented to person, place, and time.          Assessment & Plan:  #1 bilateral wrist pain. Reported remote history of ligament injury several years ago per MRI scan (per pt). We have no record of MRI scan at this time. He is advised try to track down those MRI results. Set up referral to local hand specialist #2 history of prediabetes. Check blood sugar today along with hemoglobin A1c. We discussed lifestyle management

## 2014-01-17 NOTE — Patient Instructions (Signed)
We will call you regarding orthopedic referral. 

## 2014-01-17 NOTE — Progress Notes (Signed)
Pre visit review using our clinic review tool, if applicable. No additional management support is needed unless otherwise documented below in the visit note. 

## 2014-01-18 ENCOUNTER — Telehealth: Payer: Self-pay | Admitting: Physician Assistant

## 2014-01-18 NOTE — Telephone Encounter (Signed)
emmi mailed  °

## 2014-05-20 ENCOUNTER — Encounter (HOSPITAL_COMMUNITY): Payer: Self-pay | Admitting: *Deleted

## 2014-05-20 ENCOUNTER — Emergency Department (HOSPITAL_COMMUNITY)
Admission: EM | Admit: 2014-05-20 | Discharge: 2014-05-20 | Disposition: A | Discharge status: Home / Self Care | Payer: 59 | Attending: Emergency Medicine | Admitting: Emergency Medicine

## 2014-05-20 ENCOUNTER — Emergency Department (HOSPITAL_COMMUNITY): Payer: 59

## 2014-05-20 DIAGNOSIS — Z23 Encounter for immunization: Secondary | ICD-10-CM | POA: Insufficient documentation

## 2014-05-20 DIAGNOSIS — S66122A Laceration of flexor muscle, fascia and tendon of right middle finger at wrist and hand level, initial encounter: Secondary | ICD-10-CM | POA: Insufficient documentation

## 2014-05-20 DIAGNOSIS — Z720 Tobacco use: Secondary | ICD-10-CM | POA: Diagnosis not present

## 2014-05-20 DIAGNOSIS — W260XXA Contact with knife, initial encounter: Secondary | ICD-10-CM | POA: Diagnosis not present

## 2014-05-20 DIAGNOSIS — S61219A Laceration without foreign body of unspecified finger without damage to nail, initial encounter: Secondary | ICD-10-CM

## 2014-05-20 DIAGNOSIS — Z8719 Personal history of other diseases of the digestive system: Secondary | ICD-10-CM | POA: Insufficient documentation

## 2014-05-20 DIAGNOSIS — Z8619 Personal history of other infectious and parasitic diseases: Secondary | ICD-10-CM | POA: Diagnosis not present

## 2014-05-20 DIAGNOSIS — Z8669 Personal history of other diseases of the nervous system and sense organs: Secondary | ICD-10-CM | POA: Diagnosis not present

## 2014-05-20 DIAGNOSIS — Y9389 Activity, other specified: Secondary | ICD-10-CM | POA: Insufficient documentation

## 2014-05-20 DIAGNOSIS — Y998 Other external cause status: Secondary | ICD-10-CM | POA: Insufficient documentation

## 2014-05-20 DIAGNOSIS — S61210A Laceration without foreign body of right index finger without damage to nail, initial encounter: Secondary | ICD-10-CM | POA: Diagnosis present

## 2014-05-20 DIAGNOSIS — R011 Cardiac murmur, unspecified: Secondary | ICD-10-CM | POA: Insufficient documentation

## 2014-05-20 DIAGNOSIS — I10 Essential (primary) hypertension: Secondary | ICD-10-CM | POA: Insufficient documentation

## 2014-05-20 DIAGNOSIS — Y9289 Other specified places as the place of occurrence of the external cause: Secondary | ICD-10-CM | POA: Insufficient documentation

## 2014-05-20 HISTORY — DX: Laceration without foreign body of unspecified finger without damage to nail, initial encounter: S61.219A

## 2014-05-20 MED ORDER — HYDROMORPHONE HCL 1 MG/ML IJ SOLN
1.0000 mg | Freq: Once | INTRAMUSCULAR | Status: AC
  Administered 2014-05-20: 1 mg via INTRAVENOUS
  Filled 2014-05-20: qty 1

## 2014-05-20 MED ORDER — LIDOCAINE HCL (PF) 1 % IJ SOLN
30.0000 mL | Freq: Once | INTRAMUSCULAR | Status: AC
  Administered 2014-05-20: 30 mL
  Filled 2014-05-20: qty 30

## 2014-05-20 MED ORDER — TETANUS-DIPHTH-ACELL PERTUSSIS 5-2.5-18.5 LF-MCG/0.5 IM SUSP
0.5000 mL | Freq: Once | INTRAMUSCULAR | Status: AC
  Administered 2014-05-20: 0.5 mL via INTRAMUSCULAR
  Filled 2014-05-20: qty 0.5

## 2014-05-20 MED ORDER — HYDROMORPHONE HCL 1 MG/ML IJ SOLN
1.0000 mg | Freq: Once | INTRAMUSCULAR | Status: AC
Start: 2014-05-20 — End: 2014-05-20
  Administered 2014-05-20: 1 mg via INTRAVENOUS
  Filled 2014-05-20: qty 1

## 2014-05-20 MED ORDER — CEFAZOLIN SODIUM-DEXTROSE 2-3 GM-% IV SOLR
2.0000 g | Freq: Once | INTRAVENOUS | Status: AC
  Administered 2014-05-20: 2 g via INTRAVENOUS
  Filled 2014-05-20: qty 50

## 2014-05-20 MED ORDER — LIDOCAINE HCL (PF) 1 % IJ SOLN: 10.0000 mL | Freq: Once | INTRAMUSCULAR | Status: DC

## 2014-05-20 MED ORDER — LIDOCAINE HCL (PF) 1 % IJ SOLN: 30.0000 mL | Freq: Once | INTRAMUSCULAR | Status: DC

## 2014-05-20 MED ORDER — DOXYCYCLINE HYCLATE 100 MG PO CAPS: 100.0000 mg | ORAL_CAPSULE | Freq: Two times a day (BID) | ORAL | Status: DC

## 2014-05-20 MED ORDER — HYDROCODONE-ACETAMINOPHEN 5-325 MG PO TABS: 1.0000 | ORAL_TABLET | ORAL | Status: DC | PRN

## 2014-05-20 NOTE — Progress Notes (Signed)
Orthopedic Tech Progress Note Patient Details:  Pryor MontesMichael Ghrist 05/24/1980 [id number] Applied fiberglass dorsal splint to RUE.  Pulses, sensation, motion intact before and after splinting.  Capillary refill less than 2 seconds before and after splinting.  Placed splinted RUE in arm sling. Ortho Devices Type of Ortho Device: Post (short arm) splint Ortho Device/Splint Location: Rue Ortho Device/Splint Interventions: Application   Lesle ChrisGilliland, Paola Flynt L 05/20/2014, 6:56 PM

## 2014-05-20 NOTE — Discharge Instructions (Signed)
Cast or Splint Care °Casts and splints support injured limbs and keep bones from moving while they heal. It is important to care for your cast or splint at home.   °HOME CARE INSTRUCTIONS °· Keep the cast or splint uncovered during the drying period. It can take 24 to 48 hours to dry if it is made of plaster. A fiberglass cast will dry in less than 1 hour. °· Do not rest the cast on anything harder than a pillow for the first 24 hours. °· Do not put weight on your injured limb or apply pressure to the cast until your health care provider gives you permission. °· Keep the cast or splint dry. Wet casts or splints can lose their shape and may not support the limb as well. A wet cast that has lost its shape can also create harmful pressure on your skin when it dries. Also, wet skin can become infected. °· Cover the cast or splint with a plastic bag when bathing or when out in the rain or snow. If the cast is on the trunk of the body, take sponge baths until the cast is removed. °· If your cast does become wet, dry it with a towel or a blow dryer on the cool setting only. °· Keep your cast or splint clean. Soiled casts may be wiped with a moistened cloth. °· Do not place any hard or soft foreign objects under your cast or splint, such as cotton, toilet paper, lotion, or powder. °· Do not try to scratch the skin under the cast with any object. The object could get stuck inside the cast. Also, scratching could lead to an infection. If itching is a problem, use a blow dryer on a cool setting to relieve discomfort. °· Do not trim or cut your cast or remove padding from inside of it. °· Exercise all joints next to the injury that are not immobilized by the cast or splint. For example, if you have a long leg cast, exercise the hip joint and toes. If you have an arm cast or splint, exercise the shoulder, elbow, thumb, and fingers. °· Elevate your injured arm or leg on 1 or 2 pillows for the first 1 to 3 days to decrease  swelling and pain. It is best if you can comfortably elevate your cast so it is higher than your heart. °SEEK MEDICAL CARE IF:  °· Your cast or splint cracks. °· Your cast or splint is too tight or too loose. °· You have unbearable itching inside the cast. °· Your cast becomes wet or develops a soft spot or area. °· You have a bad smell coming from inside your cast. °· You get an object stuck under your cast. °· Your skin around the cast becomes red or raw. °· You have new pain or worsening pain after the cast has been applied. °SEEK IMMEDIATE MEDICAL CARE IF:  °· You have fluid leaking through the cast. °· You are unable to move your fingers or toes. °· You have discolored (blue or white), cool, painful, or very swollen fingers or toes beyond the cast. °· You have tingling or numbness around the injured area. °· You have severe pain or pressure under the cast. °· You have any difficulty with your breathing or have shortness of breath. °· You have chest pain. °Document Released: 12/22/1999 Document Revised: 10/14/2012 Document Reviewed: 07/02/2012 °ExitCare® Patient Information ©2015 [employer]. This information is not intended to replace advice given to you by your health care   provider. Make sure you discuss any questions you have with your health care provider.   Sutured Wound Care Sutures are stitches that can be used to close wounds. Wound care helps prevent pain and infection.  HOME CARE INSTRUCTIONS   Rest and elevate the injured area until all the pain and swelling are gone.  Only take over-the-counter or prescription medicines for pain, discomfort, or fever as directed by your caregiver.  After 48 hours, gently wash the area with mild soap and water once a day, or as directed. Rinse off the soap. Pat the area dry with a clean towel. Do not rub the wound. This may cause bleeding.  Follow your caregiver's instructions for how often to change the bandage (dressing). Stop using a dressing after 2  days or after the wound stops draining.  If the dressing sticks, moisten it with soapy water and gently remove it.  Apply ointment on the wound as directed.  Avoid stretching a sutured wound.  Drink enough fluids to keep your urine clear or pale yellow.  Follow up with your caregiver for suture removal as directed.  Use sunscreen on your wound for the next 3 to 6 months so the scar will not darken. SEEK IMMEDIATE MEDICAL CARE IF:   Your wound becomes red, swollen, hot, or tender.  You have increasing pain in the wound.  You have a red streak that extends from the wound.  There is pus coming from the wound.  You have a fever.  You have shaking chills.  There is a bad smell coming from the wound.  You have persistent bleeding from the wound. MAKE SURE YOU:   Understand these instructions.  Will watch your condition.  Will get help right away if you are not doing well or get worse. Document Released: 02/01/2004 Document Revised: 03/18/2011 Document Reviewed: 04/29/2010 [facility] Patient Information 2015 [place]. This information is not intended to replace advice given to you by your health care provider. Make sure you discuss any questions you have with your health care provider.

## 2014-05-20 NOTE — ED Provider Notes (Signed)
Patient seen and examined by Roxy Horsemanobert Browning, PA-C, and Dr Blinda LeatherwoodPollina.  Pt with accidental laceration to right hand, injuring 2nd-5th fingers over palmar surface with concern for flexor tendon and nerve injury.  Pt seen in ED by Dr Merlyn LotKuzma.  I have been asked to gently rinse and loosely close lacerations.  Pt to follow up with Dr Merlyn LotKuzma Monday with surgery anticipated Tuesday.  Pt to be d/c home in splint and with antibiotics, to be ordered and prescribed by Marlon Peliffany Greene, PA-C, currently managing the patient. Discussed return precautions and follow up with patient who verbalizes understanding and agrees with plan.     LACERATION REPAIR Performed by: Trixie DredgeWEST, Wyvonne Carda Authorized by: Trixie DredgeWEST, Andrew Blasius Consent: Verbal consent obtained. Risks and benefits: risks, benefits and alternatives were discussed Consent given by: patient Patient identity confirmed: provided demographic data Prepped and Draped in normal sterile fashion Wound explored  Laceration Location: 2nd finger  Laceration Length: approx 1 cm  No Foreign Bodies seen or palpated  Anesthesia: digital block  Local anesthetic: lidocaine 1% no epinephrine  Anesthetic total: 4 ml  Irrigation method: syringe Amount of cleaning: standard  Skin closure: 5-0 prolene  Number of sutures: 2  Technique: simple interrupted  Patient tolerance: Patient tolerated the procedure well with no immediate complications.   LACERATION REPAIR Performed by: Trixie DredgeWEST, Shiree Altemus Authorized by: Trixie DredgeWEST, Twala Collings Consent: Verbal consent obtained. Risks and benefits: risks, benefits and alternatives were discussed Consent given by: patient Patient identity confirmed: provided demographic data Prepped and Draped in normal sterile fashion Wound explored  Laceration Location: 3rd finger, right hand   Laceration Length: approximately 2 cm  No Foreign Bodies seen or palpated  Anesthesia: digital block  Local anesthetic: lidocaine 1% no epinephrine  Anesthetic total: 6  ml  Irrigation method: syringe Amount of cleaning: standard  Skin closure: 5-0 prolene  Number of sutures: 5  Technique: simple interrupted  Patient tolerance: Patient tolerated the procedure well with no immediate complications.   LACERATION REPAIR Performed by: Trixie DredgeWEST, Surabhi Gadea Authorized by: Trixie DredgeWEST, Mackenize Delgadillo Consent: Verbal consent obtained. Risks and benefits: risks, benefits and alternatives were discussed Consent given by: patient Patient identity confirmed: provided demographic data Prepped and Draped in normal sterile fashion Wound explored  Laceration Location: 4th finger, right hand  Laceration Length: approximately 2 cm  No Foreign Bodies seen or palpated  Anesthesia: digital block/local infiltration  Local anesthetic: lidocaine 1% no epinephrine  Anesthetic total: 8 ml  Irrigation method: syringe Amount of cleaning: standard  Skin closure: 5-0 prolene  Number of sutures: 5  Technique: simple interrupted  Patient tolerance: Patient tolerated the procedure well with no immediate complications.   LACERATION REPAIR Performed by: Trixie DredgeWEST, Rainer Mounce Authorized by: Trixie DredgeWEST, Candia Kingsbury Consent: Verbal consent obtained. Risks and benefits: risks, benefits and alternatives were discussed Consent given by: patient Patient identity confirmed: provided demographic data Prepped and Draped in normal sterile fashion Wound explored  Laceration Location: 5th finger, right hand  Laceration Length: approximately 1 cm  No Foreign Bodies seen or palpated  Anesthesia: local infiltration  Local anesthetic: lidocaine 1% no epinephrine  Anesthetic total: 5 ml  Irrigation method: syringe Amount of cleaning: standard  Skin closure: 5-0 prolene  Number of sutures: 4  Technique: simple interrupted  Patient tolerance: Patient tolerated the procedure well with no immediate complications.   Trixie Dredgemily Karisha Marlin, PA-C 05/20/14 1822  Gilda Creasehristopher J Pollina, MD 05/21/14 [phone]

## 2014-05-20 NOTE — ED Notes (Signed)
Pt reports cutting right hand with knife. Has large laceration to posterior right middle, ring and little finger. Moderate bleeding noted at triage.

## 2014-05-20 NOTE — ED Notes (Signed)
Irving BurtonEmily PA at bedside with suture cart and nursing student.

## 2014-05-20 NOTE — ED Provider Notes (Signed)
CSN: [id number]     Arrival date & time 05/20/14  1320 History   First MD Initiated Contact with Patient 05/20/14 1332     Chief Complaint  Patient presents with  . Laceration     (Consider location/radiation/quality/duration/timing/severity/associated sxs/prior Treatment) HPI Comments: Patient presents to the emergency department with chief complaint of hand injury. He states that he was "playing with a knife" when he cut his hand. He has deep lacerations across his third, fourth, and fifth fingers as well as a moderate laceration of the index finger. Tetanus shot is unknown. Patient reports pain with palpation. States that he is unable to move his third, fourth, and fifth fingers. He has not taken anything to alleviate his symptoms.  The history is provided by the patient. No language interpreter was used.    Past Medical History  Diagnosis Date  . Sleep apnea   . Chicken pox   . Frequent headaches   . GERD (gastroesophageal reflux disease)   . Heart murmur   . Hypertension   . Migraines    Past Surgical History  Procedure Laterality Date  . Orbital fracture surgery     Family History  Problem Relation Age of Onset  . Arthritis Maternal Grandmother   . Hypertension Father   . Hypertension Mother   . Diabetes Father   . Heart disease Mother    History  Substance Use Topics  . Smoking status: Current Some Day Smoker    Types: Cigars  . Smokeless tobacco: Not on file  . Alcohol Use: Yes     Comment: occ    Review of Systems  Constitutional: Negative for fever and chills.  Respiratory: Negative for shortness of breath.   Cardiovascular: Negative for chest pain.  Gastrointestinal: Negative for nausea, vomiting, diarrhea and constipation.  Genitourinary: Negative for dysuria.  Skin: Positive for wound.      Allergies  Shellfish allergy  Home Medications   Prior to Admission medications   Medication Sig Start Date End Date Taking? Authorizing Provider    lisinopril-hydrochlorothiazide (PRINZIDE,ZESTORETIC) 10-12.5 MG per tablet Take 1 tablet by mouth. 04/08/13 04/08/14  Historical Provider, MD  triamcinolone ointment (KENALOG) 0.1 %  08/05/13   Historical Provider, MD   BP 147/96 mmHg  Pulse 103  Temp(Src) 98.5 F (36.9 C) (Oral)  Resp 18  Ht 5\' 11"  (1.803 m)  Wt 270 lb (122.471 kg)  BMI 37.67 kg/m2  SpO2 94% Physical Exam  Constitutional: He is oriented to person, place, and time. He appears well-developed and well-nourished.  HENT:  Head: Normocephalic and atraumatic.  Eyes: Conjunctivae and EOM are normal.  Neck: Normal range of motion.  Cardiovascular: Normal rate.   Pulmonary/Chest: Effort normal.  Abdominal: He exhibits no distension.  Musculoskeletal: Normal range of motion.  Unable to flex at the DIP or PIP on the third, fourth, and fifth digits of the right hand  Neurological: He is alert and oriented to person, place, and time.  Skin: Skin is dry.  Lacerations as pictured below  Psychiatric: He has a normal mood and affect. His behavior is normal. Judgment and thought content normal.  Nursing note and vitals reviewed.   ED Course  Procedures (including critical care time) Labs Review Labs Reviewed - No data to display  Imaging Review Dg Hand Complete Right  05/20/2014   CLINICAL DATA:  PIP joint lacerations by knife, second through fifth digits  EXAM: RIGHT HAND - COMPLETE 3+ VIEW  COMPARISON:  None.  FINDINGS: Frontal, oblique,  and lateral views obtained. There are soft tissue lacerations at the levels of the second, third, fourth, and fifth PIP joints. No radiopaque foreign body. There is evidence of old trauma involving the distal scaphoid bone with remodeling. There is widening of the scapholunate joint consistent with scapholunate disassociation. No dislocation. No appreciable joint space narrowing.  IMPRESSION: Soft tissue injuries at the levels of the second, third, fourth, and fifth PIP joints without radiopaque  foreign body. Apparent old trauma involving the distal scaphoid bone with presumably chronic scapholunate disassociation based on history. No evident acute fracture.   Electronically Signed   By: Bretta BangWilliam  Woodruff III M.D.   On: 05/20/2014 14:59     EKG Interpretation None        MDM   Final diagnoses:  Finger laceration involving tendon, initial encounter    Patient with lacerations to all 4 fingers of the right hand. The deepest is on the fifth digit. Will check plain films. Patient currently unable to flex fingers, concern for tendon injury.   Patient seen by and discussed with Dr. Blinda LeatherwoodPollina, plan for hand surgery consultation.  Discussed the patient with Dr. Merrilee SeashoreKuzma's nurse, who recommends keeping the patient nothing by mouth. Plan for surgery around 5:00 after Dr. Merlyn LotKuzma finishes his current case.    Roxy Horsemanobert Lux Meaders, PA-C 05/20/14 1515  Gilda Creasehristopher J Pollina, MD 05/20/14 [phone]

## 2014-05-20 NOTE — ED Provider Notes (Signed)
Patient seen by Joseph Horsemanobert Browning, Joseph Barry Waiting for DR. Merlyn Barry to see patient to evaluate for surgery. Dr. Merlyn Barry has seen patient in the ED.  Recommendations: 1. Rinse and suture  2. Place on Doxy 3. Place in Dorsi splint  4. See Dr. Merlyn Barry on Monday 5. Surgery most likely on Tuesday  Laceration closure done by Joseph DredgeEmily Barry, Joseph Barry I spoke with patient and made sure they are familiar with the plan and answered all questions.   Dorsal fiberglass splint placed, splint care provided Doxy 100 mg BID. Return precautions given.  34 y.o.Joseph FeinsteinMichael Holtmeyer's evaluation in the Emergency Department is complete. It has been determined that no acute conditions requiring further emergency intervention are present at this time. The patient/guardian have been advised of the diagnosis and plan. We have discussed signs and symptoms that warrant return to the ED, such as changes or worsening in symptoms.  Vital signs are stable at discharge. Filed Vitals:   05/20/14 1800  BP: 130/99  Pulse: 70  Temp:   Resp: 16    Patient/guardian has voiced understanding and agreed to follow-up with the PCP or specialist.   [name], Joseph Barry 05/20/14 1832  Gilda Creasehristopher J Pollina, MD 05/21/14 1221

## 2014-05-20 NOTE — ED Notes (Signed)
Hand surgeon- Caryn BeeKevin at bedside.

## 2014-05-20 NOTE — ED Provider Notes (Signed)
Patient presented to the ER with laceration to fingers. Patient reports that he cut his third fourth and fifth fingers with a knife prior to arrival. Patient complaining of moderate to severe pain.  Face to face Exam: HEENT - PERRLA Lungs - CTAB Heart - RRR, no M/R/G Abd - S/NT/ND Neuro - alert, oriented x3 Musculoskeletal - deep lacerations across the volar aspect of proximal phalanx of finger #34 and 5 on the left hand. Patient has diminished sensation distally, slightly dusky coloration to the fingers, but does have capillary refill. Patient has no flexion of third, fourth and fifth fingers.  Plan: Patient will require hand surgery consultation for suspected flexor tendon injury of fingers 3, 4, 5 on the left hand.  Gilda Creasehristopher J Gaylyn Berish, MD 05/20/14 [phone]

## 2014-05-20 NOTE — Consult Note (Signed)
  Joseph Barry is an 34 y.o. male.   Chief Complaint: right hand lacerations HPI: 34 yo rhd male states he lacerated index, long, ring, and small fingers with a clean kitchen knife today.  [facility]  Reports no previous injury to right hand and no other injury at this time.  Past Medical History  Diagnosis Date  . Sleep apnea   . Chicken pox   . Frequent headaches   . GERD (gastroesophageal reflux disease)   . Heart murmur   . Hypertension   . Migraines     Past Surgical History  Procedure Laterality Date  . Orbital fracture surgery      Family History  Problem Relation Age of Onset  . Arthritis Maternal Grandmother   . Hypertension Father   . Hypertension Mother   . Diabetes Father   . Heart disease Mother    Social History:  reports that he has been smoking Cigars.  He does not have any smokeless tobacco history on file. He reports that he drinks alcohol. He reports that he does not use illicit drugs.  Allergies:  Allergies  Allergen Reactions  . Shellfish Allergy Shortness Of Breath     (Not in a hospital admission)  No results found for this or any previous visit (from the past 48 hour(s)).  Dg Hand Complete Right  05/20/2014   CLINICAL DATA:  PIP joint lacerations by knife, second through fifth digits  EXAM: RIGHT HAND - COMPLETE 3+ VIEW  COMPARISON:  None.  FINDINGS: Frontal, oblique, and lateral views obtained. There are soft tissue lacerations at the levels of the second, third, fourth, and fifth PIP joints. No radiopaque foreign body. There is evidence of old trauma involving the distal scaphoid bone with remodeling. There is widening of the scapholunate joint consistent with scapholunate disassociation. No dislocation. No appreciable joint space narrowing.  IMPRESSION: Soft tissue injuries at the levels of the second, third, fourth, and fifth PIP joints without radiopaque foreign body. Apparent old trauma involving the distal scaphoid bone with presumably  chronic scapholunate disassociation based on history. No evident acute fracture.   Electronically Signed   By: Bretta BangWilliam  Woodruff III M.D.   On: 05/20/2014 14:59     A comprehensive review of systems was negative.  Blood pressure 104/84, pulse 75, temperature 98.5 F (36.9 C), temperature source Oral, resp. rate 16, height 5\' 11"  (1.803 m), weight 122.471 kg (270 lb), SpO2 99 %.  General appearance: alert, cooperative and appears stated age Head: Normocephalic, without obvious abnormality, atraumatic Neck: supple, symmetrical, trachea midline Extremities: brisk capillary refill all digits.  sensation decreased though he can feel light touch.  unable to flex long ring and small fingers.  lacerations proximal to pip joint of index, long, ring, small.  no gross contamination. Pulses: 2+ and symmetric Skin: Skin color, texture, turgor normal. No rashes or lesions Neurologic: Grossly normal Incision/Wound: As above  Assessment/Plan Right index, long, ring, small finger lacerations with flexor tendon lacerations, possible digital nerve/artery laceration.  Intact capillary refill in all digits.  Recommend I&D and closure of wounds by ED with follow up in office Monday for surgical planning likely for Tuesday.  Dorsal blocking splint.  Doxycycline.  Patient and wife agree with plan of care.  [name] 05/20/2014, 4:58 PM

## 2014-05-23 ENCOUNTER — Encounter (HOSPITAL_BASED_OUTPATIENT_CLINIC_OR_DEPARTMENT_OTHER): Payer: Self-pay | Admitting: *Deleted

## 2014-05-23 ENCOUNTER — Other Ambulatory Visit: Payer: Self-pay | Admitting: Orthopedic Surgery

## 2014-05-24 ENCOUNTER — Ambulatory Visit (HOSPITAL_BASED_OUTPATIENT_CLINIC_OR_DEPARTMENT_OTHER): Payer: 59 | Admitting: Anesthesiology

## 2014-05-24 ENCOUNTER — Ambulatory Visit (HOSPITAL_BASED_OUTPATIENT_CLINIC_OR_DEPARTMENT_OTHER)
Admission: AD | Admit: 2014-05-24 | Discharge: 2014-05-25 | Disposition: A | Discharge status: Home / Self Care | Payer: 59 | Source: Ambulatory Visit | Attending: Orthopedic Surgery | Admitting: Orthopedic Surgery

## 2014-05-24 ENCOUNTER — Encounter (HOSPITAL_BASED_OUTPATIENT_CLINIC_OR_DEPARTMENT_OTHER): Payer: Self-pay | Admitting: *Deleted

## 2014-05-24 ENCOUNTER — Encounter (HOSPITAL_BASED_OUTPATIENT_CLINIC_OR_DEPARTMENT_OTHER)
Admission: AD | Disposition: A | Discharge status: Home / Self Care | Payer: Self-pay | Source: Ambulatory Visit | Attending: Orthopedic Surgery

## 2014-05-24 DIAGNOSIS — K219 Gastro-esophageal reflux disease without esophagitis: Secondary | ICD-10-CM | POA: Diagnosis not present

## 2014-05-24 DIAGNOSIS — I10 Essential (primary) hypertension: Secondary | ICD-10-CM | POA: Insufficient documentation

## 2014-05-24 DIAGNOSIS — W260XXA Contact with knife, initial encounter: Secondary | ICD-10-CM | POA: Diagnosis not present

## 2014-05-24 DIAGNOSIS — S62616B Displaced fracture of proximal phalanx of right little finger, initial encounter for open fracture: Secondary | ICD-10-CM | POA: Diagnosis not present

## 2014-05-24 DIAGNOSIS — S64492A Injury of digital nerve of right middle finger, initial encounter: Secondary | ICD-10-CM | POA: Diagnosis not present

## 2014-05-24 DIAGNOSIS — S62614B Displaced fracture of proximal phalanx of right ring finger, initial encounter for open fracture: Secondary | ICD-10-CM | POA: Insufficient documentation

## 2014-05-24 DIAGNOSIS — G4730 Sleep apnea, unspecified: Secondary | ICD-10-CM | POA: Insufficient documentation

## 2014-05-24 DIAGNOSIS — S61210A Laceration without foreign body of right index finger without damage to nail, initial encounter: Secondary | ICD-10-CM | POA: Insufficient documentation

## 2014-05-24 DIAGNOSIS — S66122A Laceration of flexor muscle, fascia and tendon of right middle finger at wrist and hand level, initial encounter: Secondary | ICD-10-CM | POA: Diagnosis not present

## 2014-05-24 DIAGNOSIS — L309 Dermatitis, unspecified: Secondary | ICD-10-CM | POA: Diagnosis not present

## 2014-05-24 DIAGNOSIS — Z8614 Personal history of Methicillin resistant Staphylococcus aureus infection: Secondary | ICD-10-CM | POA: Insufficient documentation

## 2014-05-24 DIAGNOSIS — S61209A Unspecified open wound of unspecified finger without damage to nail, initial encounter: Secondary | ICD-10-CM | POA: Diagnosis present

## 2014-05-24 DIAGNOSIS — Z91013 Allergy to seafood: Secondary | ICD-10-CM | POA: Diagnosis not present

## 2014-05-24 DIAGNOSIS — S64494A Injury of digital nerve of right ring finger, initial encounter: Secondary | ICD-10-CM | POA: Diagnosis not present

## 2014-05-24 DIAGNOSIS — Y929 Unspecified place or not applicable: Secondary | ICD-10-CM | POA: Diagnosis not present

## 2014-05-24 DIAGNOSIS — S66124A Laceration of flexor muscle, fascia and tendon of right ring finger at wrist and hand level, initial encounter: Secondary | ICD-10-CM | POA: Insufficient documentation

## 2014-05-24 DIAGNOSIS — S56129A Laceration of flexor muscle, fascia and tendon of unspecified finger at forearm level, initial encounter: Secondary | ICD-10-CM | POA: Diagnosis present

## 2014-05-24 HISTORY — DX: Personal history of Methicillin resistant Staphylococcus aureus infection: Z86.14

## 2014-05-24 HISTORY — DX: Personal history of other specified conditions: Z87.898

## 2014-05-24 HISTORY — DX: Dermatitis, unspecified: L30.9

## 2014-05-24 HISTORY — DX: Laceration without foreign body of unspecified finger without damage to nail, initial encounter: S61.219A

## 2014-05-24 HISTORY — PX: NERVE, TENDON AND ARTERY REPAIR: SHX5695

## 2014-05-24 SURGERY — NERVE, TENDON AND ARTERY REPAIR
Anesthesia: General | Site: Finger | Laterality: Right

## 2014-05-24 MED ORDER — OXYCODONE HCL 5 MG PO TABS: 5.0000 mg | ORAL_TABLET | Freq: Once | ORAL | Status: DC | PRN

## 2014-05-24 MED ORDER — OXYCODONE HCL 5 MG/5ML PO SOLN: 5.0000 mg | Freq: Once | ORAL | Status: DC | PRN

## 2014-05-24 MED ORDER — KETOROLAC TROMETHAMINE 30 MG/ML IJ SOLN: 30.0000 mg | Freq: Once | INTRAMUSCULAR | Status: DC | PRN

## 2014-05-24 MED ORDER — EPHEDRINE SULFATE 50 MG/ML IJ SOLN
INTRAMUSCULAR | Status: DC | PRN
  Administered 2014-05-24 (×2): 10 mg via INTRAVENOUS

## 2014-05-24 MED ORDER — MIDAZOLAM HCL 2 MG/2ML IJ SOLN
INTRAMUSCULAR | Status: AC
  Filled 2014-05-24: qty 2

## 2014-05-24 MED ORDER — PROMETHAZINE HCL 25 MG/ML IJ SOLN: 6.2500 mg | INTRAMUSCULAR | Status: DC | PRN

## 2014-05-24 MED ORDER — TEMAZEPAM 15 MG PO CAPS: 15.0000 mg | ORAL_CAPSULE | Freq: Every evening | ORAL | Status: DC | PRN

## 2014-05-24 MED ORDER — METOCLOPRAMIDE HCL 5 MG PO TABS: 5.0000 mg | ORAL_TABLET | Freq: Three times a day (TID) | ORAL | Status: DC | PRN

## 2014-05-24 MED ORDER — LIDOCAINE HCL (CARDIAC) 20 MG/ML IV SOLN
INTRAVENOUS | Status: DC | PRN
  Administered 2014-05-24: 100 mg via INTRAVENOUS

## 2014-05-24 MED ORDER — DEXTROSE 5 % IV SOLN: 3.0000 g | INTRAVENOUS | Status: DC

## 2014-05-24 MED ORDER — CEFAZOLIN SODIUM-DEXTROSE 2-3 GM-% IV SOLR
INTRAVENOUS | Status: DC | PRN
  Administered 2014-05-24: 2 g via INTRAVENOUS

## 2014-05-24 MED ORDER — LIDOCAINE HCL (PF) 1 % IJ SOLN
INTRAVENOUS | Status: DC | PRN
  Administered 2014-05-24: 17:00:00

## 2014-05-24 MED ORDER — FENTANYL CITRATE (PF) 100 MCG/2ML IJ SOLN
INTRAMUSCULAR | Status: AC
  Filled 2014-05-24: qty 4

## 2014-05-24 MED ORDER — ONDANSETRON HCL 4 MG/2ML IJ SOLN
INTRAMUSCULAR | Status: DC | PRN
  Administered 2014-05-24: 4 mg via INTRAVENOUS

## 2014-05-24 MED ORDER — FENTANYL CITRATE (PF) 100 MCG/2ML IJ SOLN
INTRAMUSCULAR | Status: DC | PRN
  Administered 2014-05-24 (×2): 25 ug via INTRAVENOUS
  Administered 2014-05-24: 50 ug via INTRAVENOUS
  Administered 2014-05-24: 25 ug via INTRAVENOUS
  Administered 2014-05-24: 50 ug via INTRAVENOUS
  Administered 2014-05-24: 25 ug via INTRAVENOUS
  Administered 2014-05-24: 50 ug via INTRAVENOUS
  Administered 2014-05-24 (×2): 25 ug via INTRAVENOUS
  Administered 2014-05-24: 50 ug via INTRAVENOUS

## 2014-05-24 MED ORDER — GLYCOPYRROLATE 0.2 MG/ML IJ SOLN: 0.2000 mg | Freq: Once | INTRAMUSCULAR | Status: DC | PRN

## 2014-05-24 MED ORDER — FENTANYL CITRATE (PF) 100 MCG/2ML IJ SOLN: 50.0000 ug | INTRAMUSCULAR | Status: DC | PRN

## 2014-05-24 MED ORDER — MIDAZOLAM HCL 2 MG/2ML IJ SOLN
1.0000 mg | INTRAMUSCULAR | Status: DC | PRN
Start: 2014-05-24 — End: 2014-05-24
  Administered 2014-05-24: 2 mg via INTRAVENOUS

## 2014-05-24 MED ORDER — OXYCODONE-ACETAMINOPHEN 5-325 MG PO TABS: ORAL_TABLET | ORAL | Status: DC

## 2014-05-24 MED ORDER — BUPIVACAINE HCL (PF) 0.25 % IJ SOLN
INTRAMUSCULAR | Status: AC
  Filled 2014-05-24: qty 30

## 2014-05-24 MED ORDER — ONDANSETRON HCL 4 MG/2ML IJ SOLN: 4.0000 mg | Freq: Four times a day (QID) | INTRAMUSCULAR | Status: DC | PRN

## 2014-05-24 MED ORDER — HYDROMORPHONE HCL 1 MG/ML IJ SOLN
INTRAMUSCULAR | Status: AC
  Filled 2014-05-24: qty 1

## 2014-05-24 MED ORDER — PANTOPRAZOLE SODIUM 40 MG PO TBEC: 80.0000 mg | DELAYED_RELEASE_TABLET | Freq: Every day | ORAL | Status: DC

## 2014-05-24 MED ORDER — CEFAZOLIN SODIUM 1-5 GM-% IV SOLN
1.0000 g | Freq: Four times a day (QID) | INTRAVENOUS | Status: DC
  Administered 2014-05-24 – 2014-05-25 (×2): 1 g via INTRAVENOUS
  Filled 2014-05-24 (×2): qty 50

## 2014-05-24 MED ORDER — HEPARIN SODIUM (PORCINE) 1000 UNIT/ML IJ SOLN
INTRAMUSCULAR | Status: AC
  Filled 2014-05-24: qty 1

## 2014-05-24 MED ORDER — LISINOPRIL-HYDROCHLOROTHIAZIDE 10-12.5 MG PO TABS: 1.0000 | ORAL_TABLET | Freq: Every day | ORAL | Status: DC

## 2014-05-24 MED ORDER — METHOCARBAMOL 1000 MG/10ML IJ SOLN: 500.0000 mg | Freq: Four times a day (QID) | INTRAVENOUS | Status: DC | PRN

## 2014-05-24 MED ORDER — CEFAZOLIN SODIUM 1-5 GM-% IV SOLN
INTRAVENOUS | Status: AC
  Filled 2014-05-24: qty 50

## 2014-05-24 MED ORDER — ONDANSETRON HCL 4 MG PO TABS: 4.0000 mg | ORAL_TABLET | Freq: Four times a day (QID) | ORAL | Status: DC | PRN

## 2014-05-24 MED ORDER — LIDOCAINE HCL (PF) 1 % IJ SOLN
INTRAMUSCULAR | Status: AC
  Filled 2014-05-24: qty 30

## 2014-05-24 MED ORDER — CHLORHEXIDINE GLUCONATE 4 % EX LIQD: 60.0000 mL | Freq: Once | CUTANEOUS | Status: DC

## 2014-05-24 MED ORDER — HYDROMORPHONE HCL 1 MG/ML IJ SOLN
0.2500 mg | INTRAMUSCULAR | Status: DC | PRN
  Administered 2014-05-24 (×2): 0.5 mg via INTRAVENOUS

## 2014-05-24 MED ORDER — CEFAZOLIN SODIUM-DEXTROSE 2-3 GM-% IV SOLR
INTRAVENOUS | Status: AC
  Filled 2014-05-24: qty 50

## 2014-05-24 MED ORDER — OXYCODONE-ACETAMINOPHEN 5-325 MG PO TABS
1.0000 | ORAL_TABLET | ORAL | Status: DC | PRN
  Administered 2014-05-24 – 2014-05-25 (×3): 2 via ORAL
  Filled 2014-05-24 (×3): qty 2

## 2014-05-24 MED ORDER — LACTATED RINGERS IV SOLN
INTRAVENOUS | Status: DC
  Administered 2014-05-24: 10 mL/h via INTRAVENOUS
  Administered 2014-05-24 (×2): via INTRAVENOUS

## 2014-05-24 MED ORDER — MORPHINE SULFATE 2 MG/ML IJ SOLN
1.0000 mg | INTRAMUSCULAR | Status: DC | PRN
  Administered 2014-05-24: 1 mg via INTRAVENOUS
  Filled 2014-05-24: qty 1

## 2014-05-24 MED ORDER — DEXAMETHASONE SODIUM PHOSPHATE 10 MG/ML IJ SOLN
INTRAMUSCULAR | Status: DC | PRN
  Administered 2014-05-24: 10 mg via INTRAVENOUS

## 2014-05-24 MED ORDER — METHOCARBAMOL 500 MG PO TABS
500.0000 mg | ORAL_TABLET | Freq: Four times a day (QID) | ORAL | Status: DC | PRN
  Administered 2014-05-24 – 2014-05-25 (×2): 500 mg via ORAL
  Filled 2014-05-24 (×2): qty 1

## 2014-05-24 MED ORDER — PROPOFOL 10 MG/ML IV BOLUS
INTRAVENOUS | Status: DC | PRN
  Administered 2014-05-24: 300 mg via INTRAVENOUS

## 2014-05-24 MED ORDER — BUPIVACAINE HCL (PF) 0.25 % IJ SOLN
INTRAMUSCULAR | Status: DC | PRN
  Administered 2014-05-24: 10 mL

## 2014-05-24 MED ORDER — METOCLOPRAMIDE HCL 5 MG/ML IJ SOLN: 5.0000 mg | Freq: Three times a day (TID) | INTRAMUSCULAR | Status: DC | PRN

## 2014-05-24 MED ORDER — LACTATED RINGERS IV SOLN: INTRAVENOUS | Status: DC

## 2014-05-24 SURGICAL SUPPLY — 62 items
BAG DECANTER FOR FLEXI CONT (MISCELLANEOUS) ×2 IMPLANT
BANDAGE ELASTIC 3 VELCRO ST LF (GAUZE/BANDAGES/DRESSINGS) ×2 IMPLANT
BLADE MINI RND TIP GREEN BEAV (BLADE) IMPLANT
BLADE SURG 15 STRL LF DISP TIS (BLADE) ×2 IMPLANT
BLADE SURG 15 STRL SS (BLADE) ×2
BNDG ESMARK 4X9 LF (GAUZE/BANDAGES/DRESSINGS) ×2 IMPLANT
BNDG GAUZE ELAST 4 BULKY (GAUZE/BANDAGES/DRESSINGS) ×2 IMPLANT
BRUSH SCRUB EZ PLAIN DRY (MISCELLANEOUS) ×2 IMPLANT
CHLORAPREP W/TINT 26ML (MISCELLANEOUS) ×2 IMPLANT
CORDS BIPOLAR (ELECTRODE) ×2 IMPLANT
COVER BACK TABLE 60X90IN (DRAPES) ×2 IMPLANT
COVER MAYO STAND STRL (DRAPES) ×2 IMPLANT
CUFF TOURNIQUET SINGLE 18IN (TOURNIQUET CUFF) IMPLANT
DECANTER SPIKE VIAL GLASS SM (MISCELLANEOUS) IMPLANT
DRAPE EXTREMITY T 121X128X90 (DRAPE) ×2 IMPLANT
DRAPE SURG 17X23 STRL (DRAPES) ×2 IMPLANT
GAUZE SPONGE 4X4 12PLY STRL (GAUZE/BANDAGES/DRESSINGS) ×2 IMPLANT
GAUZE XEROFORM 1X8 LF (GAUZE/BANDAGES/DRESSINGS) ×2 IMPLANT
GLOVE BIO SURGEON STRL SZ7.5 (GLOVE) ×2 IMPLANT
GLOVE BIOGEL PI IND STRL 7.0 (GLOVE) ×1 IMPLANT
GLOVE BIOGEL PI IND STRL 8 (GLOVE) ×2 IMPLANT
GLOVE BIOGEL PI IND STRL 8.5 (GLOVE) ×2 IMPLANT
GLOVE BIOGEL PI INDICATOR 7.0 (GLOVE) ×1
GLOVE BIOGEL PI INDICATOR 8 (GLOVE) ×2
GLOVE BIOGEL PI INDICATOR 8.5 (GLOVE) ×2
GLOVE ECLIPSE 6.5 STRL STRAW (GLOVE) ×2 IMPLANT
GLOVE SURG ORTHO 8.0 STRL STRW (GLOVE) ×2 IMPLANT
GOWN STRL REUS W/ TWL LRG LVL3 (GOWN DISPOSABLE) ×1 IMPLANT
GOWN STRL REUS W/TWL LRG LVL3 (GOWN DISPOSABLE) ×1
GOWN STRL REUS W/TWL XL LVL3 (GOWN DISPOSABLE) ×4 IMPLANT
LOOP VESSEL MAXI BLUE (MISCELLANEOUS) IMPLANT
NDL SAFETY ECLIPSE 18X1.5 (NEEDLE) ×1 IMPLANT
NEEDLE HYPO 18GX1.5 SHARP (NEEDLE) ×1
NEEDLE HYPO 25X1 1.5 SAFETY (NEEDLE) ×2 IMPLANT
NS IRRIG 1000ML POUR BTL (IV SOLUTION) ×2 IMPLANT
PACK BASIN DAY SURGERY FS (CUSTOM PROCEDURE TRAY) ×2 IMPLANT
PAD CAST 3X4 CTTN HI CHSV (CAST SUPPLIES) IMPLANT
PAD CAST 4YDX4 CTTN HI CHSV (CAST SUPPLIES) ×1 IMPLANT
PADDING CAST ABS 4INX4YD NS (CAST SUPPLIES) ×1
PADDING CAST ABS COTTON 4X4 ST (CAST SUPPLIES) ×1 IMPLANT
PADDING CAST COTTON 3X4 STRL (CAST SUPPLIES)
PADDING CAST COTTON 4X4 STRL (CAST SUPPLIES) ×1
SLEEVE SCD COMPRESS KNEE MED (MISCELLANEOUS) ×2 IMPLANT
SPEAR EYE SURG WECK-CEL (MISCELLANEOUS) IMPLANT
SPLINT PLASTER CAST XFAST 3X15 (CAST SUPPLIES) IMPLANT
SPLINT PLASTER XTRA FASTSET 3X (CAST SUPPLIES)
STOCKINETTE 4X48 STRL (DRAPES) ×2 IMPLANT
SUT ETHIBOND 3-0 V-5 (SUTURE) IMPLANT
SUT ETHILON 4 0 PS 2 18 (SUTURE) ×2 IMPLANT
SUT FIBERWIRE 4-0 18 TAPR NDL (SUTURE) ×2
SUT MERSILENE 6 0 P 1 (SUTURE) IMPLANT
SUT NYLON 9 0 VRM6 (SUTURE) ×2 IMPLANT
SUT PROLENE 6 0 P 1 18 (SUTURE) ×2 IMPLANT
SUT SILK 4 0 PS 2 (SUTURE) ×4 IMPLANT
SUT SUPRAMID 4-0 (SUTURE) IMPLANT
SUT VICRYL 4-0 PS2 18IN ABS (SUTURE) IMPLANT
SUTURE FIBERWR 4-0 18 TAPR NDL (SUTURE) ×1 IMPLANT
SYR BULB 3OZ (MISCELLANEOUS) ×2 IMPLANT
SYR CONTROL 10ML LL (SYRINGE) ×2 IMPLANT
TOWEL OR 17X24 6PK STRL BLUE (TOWEL DISPOSABLE) ×4 IMPLANT
TRAY DSU PREP LF (CUSTOM PROCEDURE TRAY) IMPLANT
UNDERPAD 30X30 (UNDERPADS AND DIAPERS) ×2 IMPLANT

## 2014-05-24 NOTE — Anesthesia Postprocedure Evaluation (Signed)
  Anesthesia Post-op Note  Patient: Joseph Barry  Procedure(s) Performed: Procedure(s): RIGHT INDEX LONG RING SMALL/REPAIR TENDON, ARTERY NERVE (Right)  Patient Location: PACU  Anesthesia Type:General  Level of Consciousness: awake and alert   Airway and Oxygen Therapy: Patient Spontanous Breathing  Post-op Pain: none  Post-op Assessment: Post-op Vital signs reviewed, Patient's Cardiovascular Status Stable and Respiratory Function Stable  Post-op Vital Signs: Reviewed  Filed Vitals:   05/24/14 1915  BP: 134/90  Pulse: 88  Temp:   Resp: 17    Complications: No apparent anesthesia complications

## 2014-05-24 NOTE — Discharge Instructions (Addendum)
°Post Anesthesia Home Care Instructions ° °Activity: °Get plenty of rest for the remainder of the day. A responsible adult should stay with you for 24 hours following the procedure.  °For the next 24 hours, DO NOT: °-Drive a car °-Operate machinery °-Drink alcoholic beverages °-Take any medication unless instructed by your physician °-Make any legal decisions or sign important papers. ° °Meals: °Start with liquid foods such as gelatin or soup. Progress to regular foods as tolerated. Avoid greasy, spicy, heavy foods. If nausea and/or vomiting occur, drink only clear liquids until the nausea and/or vomiting subsides. Call your physician if vomiting continues. ° °Special Instructions/Symptoms: °Your throat may feel dry or sore from the anesthesia or the breathing tube placed in your throat during surgery. If this causes discomfort, gargle with warm salt water. The discomfort should disappear within 24 hours. ° °If you had a scopolamine patch placed behind your ear for the management of post- operative nausea and/or vomiting: ° °1. The medication in the patch is effective for 72 hours, after which it should be removed.  Wrap patch in a tissue and discard in the trash. Wash hands thoroughly with soap and water. °2. You may remove the patch earlier than 72 hours if you experience unpleasant side effects which may include dry mouth, dizziness or visual disturbances. °3. Avoid touching the patch. Wash your hands with soap and water after contact with the patch. °  °[facility] Instructions °Hand Surgery ° °Wound Care: °Keep your hand elevated above the level of your heart.  Do not allow it to dangle by your side.  Keep the dressing dry and do not remove it unless your doctor advises you to do so.  He will usually change it at the time of your post-op visit.  Moving your fingers is advised to stimulate circulation but will depend on the site of your surgery.  If you have a splint applied, your doctor will advise you  regarding movement. ° °Activity: °Do not drive or operate machinery today.  Rest today and then you may return to your normal activity and work as indicated by your physician. ° °Diet:  °Drink liquids today or eat a light diet.  You may resume a regular diet tomorrow.   ° °General expectations: °Pain for two to three days. °Fingers may become slightly swollen. ° °Call your doctor if any of the following occur: °Severe pain not relieved by pain medication. °Elevated temperature. °Dressing soaked with blood. °Inability to move fingers. °White or bluish color to fingers. ° ° ° °Post Anesthesia Home Care Instructions ° °Activity: °Get plenty of rest for the remainder of the day. A responsible adult should stay with you for 24 hours following the procedure.  °For the next 24 hours, DO NOT: °-Drive a car °-Operate machinery °-Drink alcoholic beverages °-Take any medication unless instructed by your physician °-Make any legal decisions or sign important papers. ° °Meals: °Start with liquid foods such as gelatin or soup. Progress to regular foods as tolerated. Avoid greasy, spicy, heavy foods. If nausea and/or vomiting occur, drink only clear liquids until the nausea and/or vomiting subsides. Call your physician if vomiting continues. ° °Special Instructions/Symptoms: °Your throat may feel dry or sore from the anesthesia or the breathing tube placed in your throat during surgery. If this causes discomfort, gargle with warm salt water. The discomfort should disappear within 24 hours. ° °If you had a scopolamine patch placed behind your ear for the management of post- operative nausea and/or vomiting: ° °  1. The medication in the patch is effective for 72 hours, after which it should be removed.  Wrap patch in a tissue and discard in the trash. Wash hands thoroughly with soap and water. °2. You may remove the patch earlier than 72 hours if you experience unpleasant side effects which may include dry mouth, dizziness or visual  disturbances. °3. Avoid touching the patch. Wash your hands with soap and water after contact with the patch. °  ° °

## 2014-05-24 NOTE — Anesthesia Preprocedure Evaluation (Addendum)
Anesthesia Evaluation  Patient identified by MRN, date of birth, ID band Patient awake    Reviewed: Allergy & Precautions, NPO status , Patient's Chart, lab work & pertinent test results  Airway Mallampati: II  TM Distance: >3 FB Neck ROM: Full    Dental  (+) Teeth Intact   Pulmonary sleep apnea and Continuous Positive Airway Pressure Ventilation , Current Smoker,  breath sounds clear to auscultation        Cardiovascular hypertension, Pt. on medications Rhythm:Regular Rate:Normal     Neuro/Psych negative neurological ROS     GI/Hepatic Neg liver ROS, GERD-  ,  Endo/Other  Morbid obesity  Renal/GU negative Renal ROS     Musculoskeletal   Abdominal   Peds  Hematology negative hematology ROS (+)   Anesthesia Other Findings   Reproductive/Obstetrics                            Anesthesia Physical Anesthesia Plan  ASA: II  Anesthesia Plan: General   Post-op Pain Management:    Induction: Intravenous  Airway Management Planned: LMA  Additional Equipment:   Intra-op Plan:   Post-operative Plan:   Informed Consent: I have reviewed the patients History and Physical, chart, labs and discussed the procedure including the risks, benefits and alternatives for the proposed anesthesia with the patient or authorized representative who has indicated his/her understanding and acceptance.   Dental advisory given  Plan Discussed with: CRNA  Anesthesia Plan Comments:         Anesthesia Quick Evaluation

## 2014-05-24 NOTE — H&P (Signed)
  Joseph Barry is an 34 y.o. male.   Chief Complaint: right index/long/ring/small lacerations HPI: 34 yo rhd male states he lacerated fingers of right hand 05/19/13.  Sutured and splinted in ED.  Unable to flex long/ring/small.  Decreased sensation.  Reports no previous injury to finger and no other injury at this time.  Past Medical History  Diagnosis Date  . GERD (gastroesophageal reflux disease)   . Migraines   . Hypertension     states under control with med., has been on med. x 1 yr.  . History of cardiac murmur as a child   . Sleep apnea     uses CPAP nightly  . Eczema   . History of MRSA infection ~ 2007    back  . Finger laceration involving tendon 05/20/2014    right index, long, ring, small fingers - tendon/artery/nerve lac.    Past Surgical History  Procedure Laterality Date  . Orbital fracture surgery Left 2003    Family History  Problem Relation Age of Onset  . Arthritis Maternal Grandmother   . Hypertension Father   . Hypertension Mother   . Diabetes Father   . Heart disease Mother    Social History:  reports that he has been smoking Cigars.  He has never used smokeless tobacco. He reports that he drinks alcohol. He reports that he does not use illicit drugs.  Allergies:  Allergies  Allergen Reactions  . Shellfish Allergy Shortness Of Breath and Swelling    Medications Prior to Admission  Medication Sig Dispense Refill  . doxycycline (VIBRAMYCIN) 100 MG capsule Take 1 capsule (100 mg total) by mouth 2 (two) times daily. 20 capsule 0  . HYDROcodone-acetaminophen (NORCO/VICODIN) 5-325 MG per tablet Take 1-2 tablets by mouth every 4 (four) hours as needed. 20 tablet 0  . lisinopril-hydrochlorothiazide (PRINZIDE,ZESTORETIC) 10-12.5 MG per tablet Take 1 tablet by mouth daily.    Marland Kitchen. omeprazole (PRILOSEC) 40 MG capsule Take 40 mg by mouth daily.      No results found for this or any previous visit (from the past 48 hour(s)).  No results found.   A  comprehensive review of systems was negative.  Blood pressure 141/90, pulse 53, temperature 98.5 F (36.9 C), temperature source Oral, resp. rate 18, height 5' 11.06" (1.805 m), weight 125.646 kg (277 lb), SpO2 100 %.  General appearance: alert, cooperative and appears stated age Head: Normocephalic, without obvious abnormality, atraumatic Neck: supple, symmetrical, trachea midline Resp: clear to auscultation bilaterally Cardio: regular rate and rhythm GI: non tender Extremities: decreased sensation long/ring/small right hand.  brisk capillary refill.  unable to flex long/ring/small.  lacerations at pip level.   Pulses: 2+ and symmetric Skin: Skin color, texture, turgor normal. No rashes or lesions Neurologic: Grossly normal Incision/Wound: As above  Assessment/Plan Right index/long/ring/small finger tendon/artery/nerve lacerations.  Non operative and operative treatment options were discussed with the patient and patient wishes to proceed with operative treatment. Risks, benefits, and alternatives of surgery were discussed and the patient agrees with the plan of care.   Sady Monaco R 05/24/2014, 3:13 PM

## 2014-05-24 NOTE — Brief Op Note (Signed)
05/24/2014  6:48 PM  PATIENT:  Joseph Barry  34 y.o. male  PRE-OPERATIVE DIAGNOSIS:  RIGHT INDEX LONG RING SMALL/TENDON/ARTERY NERVE LACERATIONS  POST-OPERATIVE DIAGNOSIS:  RIGHT INDEX LONG RING SMALL/TENDON/ARTERY NERVE LACERATIONS  PROCEDURE:  Procedure(s): RIGHT INDEX LONG RING SMALL/REPAIR TENDON, ARTERY NERVE (Right)  SURGEON:  Surgeon(s) and Role:    * Betha LoaKevin Woodrow Dulski, MD - Primary    * Cindee SaltGary Timika Muench, MD - Assisting  PHYSICIAN ASSISTANT:   ASSISTANTS: Cindee SaltGary Akeel Reffner, MD   ANESTHESIA:   general  EBL:  Total I/O In: 1800 [I.V.:1800] Out: -   BLOOD ADMINISTERED:none  DRAINS: none   LOCAL MEDICATIONS USED:  MARCAINE     SPECIMEN:  No Specimen  DISPOSITION OF SPECIMEN:  N/A  COUNTS:  YES  TOURNIQUET:   Total Tourniquet Time Documented: Upper Arm (Right) - 138 minutes Total: Upper Arm (Right) - 138 minutes   DICTATION: .Other Dictation: Dictation Number [id number]  PLAN OF CARE: Discharge to home after PACU  PATIENT DISPOSITION:  PACU - hemodynamically stable.

## 2014-05-24 NOTE — Transfer of Care (Signed)
Immediate Anesthesia Transfer of Care Note  Patient: Joseph Barry  Procedure(s) Performed: Procedure(s): RIGHT INDEX LONG RING SMALL/REPAIR TENDON, ARTERY NERVE (Right)  Patient Location: PACU  Anesthesia Type:General  Level of Consciousness: sedated  Airway & Oxygen Therapy: Patient Spontanous Breathing and Patient connected to face mask oxygen  Post-op Assessment: Report given to RN and Post -op Vital signs reviewed and stable  Post vital signs: Reviewed and stable  Last Vitals:  Filed Vitals:   05/24/14 1851  BP:   Pulse:   Temp: 36.8 C  Resp:     Complications: No apparent anesthesia complications

## 2014-05-24 NOTE — Op Note (Signed)
[id number] 

## 2014-05-24 NOTE — Anesthesia Procedure Notes (Signed)
Procedure Name: LMA Insertion Date/Time: 05/24/2014 4:05 PM Performed by: Burna CashONRAD, Sebrena Engh C Pre-anesthesia Checklist: Patient identified, Emergency Drugs available, Suction available and Patient being monitored Patient Re-evaluated:Patient Re-evaluated prior to inductionOxygen Delivery Method: Circle System Utilized Preoxygenation: Pre-oxygenation with 100% oxygen Intubation Type: IV induction Ventilation: Mask ventilation without difficulty LMA: LMA inserted LMA Size: 5.0 Number of attempts: 1 Airway Equipment and Method: Bite block Placement Confirmation: positive ETCO2 Tube secured with: Tape Dental Injury: Teeth and Oropharynx as per pre-operative assessment

## 2014-05-25 ENCOUNTER — Encounter (HOSPITAL_BASED_OUTPATIENT_CLINIC_OR_DEPARTMENT_OTHER): Payer: Self-pay | Admitting: Orthopedic Surgery

## 2014-05-25 DIAGNOSIS — S62614B Displaced fracture of proximal phalanx of right ring finger, initial encounter for open fracture: Secondary | ICD-10-CM | POA: Diagnosis not present

## 2014-05-25 NOTE — Op Note (Signed)
NAMFrancine Barry:  Standard, Joseph Barry             ACCOUNT NO.:  [id number]  MEDICAL RECORD NO.:  [id number]  LOCATION:                                 FACILITY:  PHYSICIAN:  Joseph LoaKevin Verlon Carcione, MD        DATE OF BIRTH:  05-27-1980  DATE OF PROCEDURE:  05/24/2014 DATE OF DISCHARGE:                              OPERATIVE REPORT   PREOPERATIVE DIAGNOSES:  Right index, long, ring, and small finger lacerations with tendon artery nerve lacerations.  POSTOPERATIVE DIAGNOSES: 1. Right index finger laceration. 2. Right long finger flexor digitorum profundus and flexor digitorum     superficialis lacerations zone 2, ulnar digital nerve and artery     lacerations. 3. Right ring finger flexor digitorum profundus and flexor digitorum     superficialis zone 2 lacerations, radial digital nerve and artery     laceration, ulnar digital nerve and artery lacerations. 4. Right small finger flexor digitorum profundus and flexor digitorum     superficialis zone 2 lacerations, radial digital nerve and artery     laceration, ulnar digital nerve and artery laceration. 5. Right small finger fracture of proximal phalanx, condyles. 6. Right ring finger fracture of proximal phalanx, condyle.  PROCEDURES: 1. Right index finger exploration and closure of intermediate wound ~ 1.2 cm 2. Right long finger repair of flexor digitorum profundus zone 2 laceration 3. Right long finger repair of flexor digitorum superficialis zone 2 laceration 4. Right long finger repair of ulnar digital nerve under microscope 5. Right long finger repair of ulnar digital artery under microscope 6. Right ring finger repair of flexor digitorum profundus zone 2 laceration 7. Right ring finger repair of flexor digitorum superficialis zone 2 laceration 8. Right ring finger repair of radial digital nerve under microscope 9. Right ring finger repair of radial digital artery under microscope 10. Right ring finger repair of ulnar digital nerve under microscope   11. Right ring finger repair of ulnar digital artery under microscope 12. Right ring finger irrigation and debridement of open proximal phalanx fracture 13. Right small finger repair of flexor digitorum profundus zone 2 laceration 14. Right small finger repair of flexor digitorum superficialis zone 2 laceration 15. Right small finger repair of radial digital nerve under microscope  16. Right small finger repair of radial digital artery under microscope 17. Right small finger repair of ulnar digital nerve under microscope 18. Right small finger repair of ulnar digital artery under microscope 19. Right small finger irrigation and debridement of proximal phalanx open fracture  SURGEON:  Joseph LoaKevin Anneli Barry, M.D.  ASSISTANT:  Joseph Barry, M.D.  ANESTHESIA:  General.  IV FLUIDS:  Per Anesthesia flow sheet.  ESTIMATED BLOOD LOSS:  Minimal.  COMPLICATIONS:  None.  SPECIMENS:  None.  TOURNIQUET TIME:  138 minutes.  DISPOSITION:  Stable to PACU.  INDICATIONS:  Joseph Barry is a 34 year old male who, on May 20, 2014, states he lacerated the right index, long, ring, and small fingers with a kitchen knife.  [facility] where his wound were irrigated and debrided and sutured.  He followed up in the office. I recommended operative exploration of the wound with repair of tendon, artery,  and nerve as necessary.  The risks, benefits, and alternatives of surgery were discussed including risk of blood loss; infection; damage to nerves, vessels, tendons, ligaments, bone; failure of surgery; need for additional surgery; complications with wound healing; continued pain, and stiffness.  He voiced understanding of these risks and elected to proceed.  OPERATIVE COURSE:  After being identified preoperatively by myself, the patient and I agreed upon the procedure and site of the procedure. Surgical site was marked.  The risks, benefits, and alternatives of surgery were  reviewed and he wished to proceed.  Surgical consent had been signed.  He was given IV Ancef as preoperative antibiotic prophylaxis.  He was transported to the operating room and placed on the operating room table in supine position with the right upper extremity on arm board.  General anesthesia was induced by anesthesiologist. Right upper extremity was prepped and draped in normal sterile orthopedic fashion.  Surgical pause was performed between surgeons, Anesthesia, and operating room staff, and all were in agreement as to the patient, procedure, and site of the procedure.  Tourniquet at the proximal aspect of the extremity was inflated to 250 mmHg after exsanguination of the limb with an Esmarch bandage.  The index finger was addressed first.  The wound was appoximately 1.2 cm and was extended both proximally and distally.  There was no laceration of the tendons.  The radial digital nerve and artery were outside the zone of injury.  The ulnar digital nerve and artery were identified and were intact.  The long finger was explored.  The wound was extended both proximally and distally.  There was a complete laceration of the FDP and FDS tendons in zone 2.  The radial digital nerve and artery were intact.  The ulnar digital nerve and artery were lacerated.  The ring finger was explored.  The wound was extended both proximally and distally.  The FDP and FDS were lacerated in zone 2.  The radial digital nerve and artery and ulnar digital nerve and artery were lacerated.  There was a loss of bone at the condyle of the proximal phalanx.  There was no gross contamination.  The small finger was explored.  The wound was again extended both proximally and distally.  The FDP and FDS tendons were 100% lacerated.  The radial digital nerve and artery and ulnar digital nerve and artery were lacerated.  The condyles of the proximal phalanx had been removed traumatically.  There was no gross  contamination.  All wounds including the open fractures were copiously irrigated with sterile saline.  The wound on the index finger was closed with 4-0 nylon suture.  The long finger was addressed.  The FDP and FDS tendons were able to be retrieved.  They were provisionally secured with a 25-gauge needle.  The 2 arms of the FDS tendon were repaired in a figure-of-eight fashion using 3-0 FiberWire suture.  This provided good apposition of the tendon edges.  The FDP tendon was repaired with a modified Kessler 2-strand core suture and a running epitendinous 6-0 Prolene suture.  This provided good apposition of the tendon ends.  The proximal aspect of the A4 pulley for 1 to 2 mm was vented to allow visualization of the distal stump to allow repair.  The ring finger tendons were then addressed. Again, the tendons were able to be retrieved and secured with the needle.  The 2 arms of the FDS tendon were repaired with the 3-0 FiberWire suture in a figure-of-eight fashion.  This provided good apposition of the tendon ends.  The modified Kessler 2-strand core suture was passed in the FDP tendon and a running 6-0 Prolene epitendinous suture was used.  This provided good apposition of the tendon edges and good contour of the tendon.  Again, the proximal aspect of the A4 pulley for 1 to 2 mm had to be vented to allow visualization and repair of the tendons.  On the small finger, the tendons were again retrieved and secured with the needle.  The 2 arms of the FDS were repaired with 3-0 FiberWire suture in a figure-of-eight fashion.  This provided good apposition of the tendon ends.  A modified Kessler 2- strand FiberWire was passed in the FDP tendon and a running epitendinous 6-0 Prolene suture was used.  Good apposition of the tendon ends was achieved and good contour of the tendon.  Again, the A4 pulley proximally had to be vented to allow visualization of repair of the tendons.  The microscope was  then brought in.  On the long finger, the ulnar digital nerve and artery were repaired with 9-0 nylon suture in an interrupted circumferential fashion.  This provided good apposition of the arterial ends and good apposition of the nerve ends.  On the long finger, the microscope was again used to repair both the radial and ulnar artery.  A 9-0 nylon suture was used in an interrupted circumferential fashion.  A 9-0 nylon suture was used to repair the radial and ulnar digital nerves.  Again, this was done in an interrupted circumferential fashion.  Good apposition of the arterial and the nerve ends was achieved.  On the small finger, the microscope was again used to repair the radial and ulnar digital arteries.  A 9-0 nylon suture was used in an interrupted circumferential fashion.  Good apposition of the arterial ends was achieved.  The radial and ulnar digital nerve were repaired using 9-0 nylon suture in an interrupted circumferential fashion.  Good apposition of the nerve ends was achieved.  The wounds were all closed with 4-0 nylon suture in a horizontal mattress fashion. Digital blocks were performed.  A 10 mL of 0.25% plain Marcaine to aid in postoperative analgesia.  The wounds were then dressed with sterile Xeroform, 4x4s, and wrapped with a Kerlix bandage.  A dorsal blocking splint was placed with the wrist at 30 to 40 degrees of flexion, the MPs flexed and the IPs extended.  This was wrapped with Kerlix and Ace bandage.  Tourniquet deflated at 138 minutes.  Fingertips were pink with brisk capillary refill after deflation of the tourniquet.  The operative drapes were broken down and the patient was awakened from anesthesia safely.  He was transferred back to stretcher and taken to the PACU in stable condition.  I will see him back in the office in 1 week for postoperative followup.  I will give him Percocet 5/325, 1 to 2 p.o. q.6 hours p.r.n. pain, dispensed #40.  He will continue  on his oral antibiotics.     Joseph Loa, MD     KK/MEDQ  D:  05/24/2014  T:  05/25/2014  Job:  [id number]

## 2014-06-02 ENCOUNTER — Encounter: Payer: Self-pay | Admitting: Adult Health

## 2014-06-02 ENCOUNTER — Ambulatory Visit (INDEPENDENT_AMBULATORY_CARE_PROVIDER_SITE_OTHER): Payer: 59 | Admitting: Adult Health

## 2014-06-02 VITALS — BP 120/80 | Temp 98.6°F | Ht 70.5 in | Wt 277.3 lb

## 2014-06-02 DIAGNOSIS — Z0289 Encounter for other administrative examinations: Secondary | ICD-10-CM

## 2014-06-02 DIAGNOSIS — Z020 Encounter for examination for admission to educational institution: Secondary | ICD-10-CM

## 2014-06-02 DIAGNOSIS — Z23 Encounter for immunization: Secondary | ICD-10-CM | POA: Diagnosis not present

## 2014-06-02 LAB — POCT URINALYSIS DIPSTICK
BILIRUBIN UA: NEGATIVE
Glucose, UA: NEGATIVE
Leukocytes, UA: NEGATIVE
Nitrite, UA: NEGATIVE
Protein, UA: NEGATIVE
Spec Grav, UA: >=1.03
Urobilinogen, UA: 0.2
pH, UA: 5.5

## 2014-06-02 NOTE — Patient Instructions (Addendum)
I will follow up with you as soon as your blood work is complete and your paperwork is done. Let me know if you need anything in the mean time. Good luck in school!  Health Maintenance A healthy lifestyle and preventative care can promote health and wellness.  Maintain regular health, dental, and eye exams.  Eat a healthy diet. Foods like vegetables, fruits, whole grains, low-fat dairy products, and lean protein foods contain the nutrients you need and are low in calories. Decrease your intake of foods high in solid fats, added sugars, and salt. Get information about a proper diet from your health care provider, if necessary.  Regular physical exercise is one of the most important things you can do for your health. Most adults should get at least 150 minutes of moderate-intensity exercise (any activity that increases your heart rate and causes you to sweat) each week. In addition, most adults need muscle-strengthening exercises on 2 or more days a week.   Maintain a healthy weight. The body mass index (BMI) is a screening tool to identify possible weight problems. It provides an estimate of body fat based on height and weight. Your health care provider can find your BMI and can help you achieve or maintain a healthy weight. For males 20 years and older:  A BMI below 18.5 is considered underweight.  A BMI of 18.5 to 24.9 is normal.  A BMI of 25 to 29.9 is considered overweight.  A BMI of 30 and above is considered obese.  Maintain normal blood lipids and cholesterol by exercising and minimizing your intake of saturated fat. Eat a balanced diet with plenty of fruits and vegetables. Blood tests for lipids and cholesterol should begin at age 14 and be repeated every 5 years. If your lipid or cholesterol levels are high, you are over age 34, or you are at high risk for heart disease, you may need your cholesterol levels checked more frequently.Ongoing high lipid and cholesterol levels should be  treated with medicines if diet and exercise are not working.  If you smoke, find out from your health care provider how to quit. If you do not use tobacco, do not start.  Lung cancer screening is recommended for adults aged 55-80 years who are at high risk for developing lung cancer because of a history of smoking. A yearly low-dose CT scan of the lungs is recommended for people who have at least a 30-pack-year history of smoking and are current smokers or have quit within the past 15 years. A pack year of smoking is smoking an average of 1 pack of cigarettes a day for 1 year (for example, a 30-pack-year history of smoking could mean smoking 1 pack a day for 30 years or 2 packs a day for 15 years). Yearly screening should continue until the smoker has stopped smoking for at least 15 years. Yearly screening should be stopped for people who develop a health problem that would prevent them from having lung cancer treatment.  If you choose to drink alcohol, do not have more than 2 drinks per day. One drink is considered to be 12 oz (360 mL) of beer, 5 oz (150 mL) of wine, or 1.5 oz (45 mL) of liquor.  Avoid the use of street drugs. Do not share needles with anyone. Ask for help if you need support or instructions about stopping the use of drugs.  High blood pressure causes heart disease and increases the risk of stroke. Blood pressure should be checked  at least every 1-2 years. Ongoing high blood pressure should be treated with medicines if weight loss and exercise are not effective.  If you are [id number] years old, ask your health care provider if you should take aspirin to prevent heart disease.  Diabetes screening involves taking a blood sample to check your fasting blood sugar level. This should be done once every 3 years after age 34 if you are at a normal weight and without risk factors for diabetes. Testing should be considered at a younger age or be carried out more frequently if you are overweight and  have at least 1 risk factor for diabetes.  Colorectal cancer can be detected and often prevented. Most routine colorectal cancer screening begins at the age of 450 and continues through age 34. However, your health care provider may recommend screening at an earlier age if you have risk factors for colon cancer. On a yearly basis, your health care provider may provide home test kits to check for hidden blood in the stool. A small camera at the end of a tube may be used to directly examine the colon (sigmoidoscopy or colonoscopy) to detect the earliest forms of colorectal cancer. Talk to your health care provider about this at age 34 when routine screening begins. A direct exam of the colon should be repeated every 5-10 years through age 34, unless early forms of precancerous polyps or small growths are found.  People who are at an increased risk for hepatitis B should be screened for this virus. You are considered at high risk for hepatitis B if:  You were born in a country where hepatitis B occurs often. Talk with your health care provider about which countries are considered high risk.  Your parents were born in a high-risk country and you have not received a shot to protect against hepatitis B (hepatitis B vaccine).  You have HIV or AIDS.  You use needles to inject street drugs.  You live with, or have sex with, someone who has hepatitis B.  You are a man who has sex with other men (MSM).  You get hemodialysis treatment.  You take certain medicines for conditions like cancer, organ transplantation, and autoimmune conditions.  Hepatitis C blood testing is recommended for all people born from [id number] through 1965 and any individual with known risk factors for hepatitis C.  Healthy men should no longer receive prostate-specific antigen (PSA) blood tests as part of routine cancer screening. Talk to your health care provider about prostate cancer screening.  Testicular cancer screening is not  recommended for adolescents or adult males who have no symptoms. Screening includes self-exam, a health care provider exam, and other screening tests. Consult with your health care provider about any symptoms you have or any concerns you have about testicular cancer.  Practice safe sex. Use condoms and avoid high-risk sexual practices to reduce the spread of sexually transmitted infections (STIs).  You should be screened for STIs, including gonorrhea and chlamydia if:  You are sexually active and are younger than 24 years.  You are older than 24 years, and your health care provider tells you that you are at risk for this type of infection.  Your sexual activity has changed since you were last screened, and you are at an increased risk for chlamydia or gonorrhea. Ask your health care provider if you are at risk.  If you are at risk of being infected with HIV, it is recommended that you take a prescription medicine daily  to prevent HIV infection. This is called pre-exposure prophylaxis (PrEP). You are considered at risk if:  You are a man who has sex with other men (MSM).  You are a heterosexual man who is sexually active with multiple partners.  You take drugs by injection.  You are sexually active with a partner who has HIV.  Talk with your health care provider about whether you are at high risk of being infected with HIV. If you choose to begin PrEP, you should first be tested for HIV. You should then be tested every 3 months for as long as you are taking PrEP.  Use sunscreen. Apply sunscreen liberally and repeatedly throughout the day. You should seek shade when your shadow is shorter than you. Protect yourself by wearing long sleeves, pants, a wide-brimmed hat, and sunglasses year round whenever you are outdoors.  Tell your health care provider of new moles or changes in moles, especially if there is a change in shape or color. Also, tell your health care provider if a mole is larger  than the size of a pencil eraser.  A one-time screening for abdominal aortic aneurysm (AAA) and surgical repair of large AAAs by ultrasound is recommended for men aged 65-75 years who are current or former smokers.  Stay current with your vaccines (immunizations). Document Released: 06/22/2007 Document Revised: 12/29/2012 Document Reviewed: 05/21/2010 [facility] Patient Information 2015 [place], [place]. This information is not intended to replace advice given to you by your health care provider. Make sure you discuss any questions you have with your health care provider.
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
## 2014-06-03 ENCOUNTER — Ambulatory Visit (INDEPENDENT_AMBULATORY_CARE_PROVIDER_SITE_OTHER): Payer: 59

## 2014-06-03 ENCOUNTER — Telehealth: Payer: Self-pay | Admitting: Adult Health

## 2014-06-03 DIAGNOSIS — Z23 Encounter for immunization: Secondary | ICD-10-CM

## 2014-06-03 LAB — HEPATITIS B SURFACE ANTIBODY,QUALITATIVE: HEP B S AB: POSITIVE — AB

## 2014-06-03 LAB — MEASLES/MUMPS/RUBELLA IMMUNITY
RUBEOLA IGG: 8.6 [AU]/ml (ref <25.00)
Rubella: 0.41 Index (ref <0.90)

## 2014-06-03 LAB — HEPATITIS B SURFACE ANTIGEN: HEP B S AG: NEGATIVE

## 2014-06-03 NOTE — Telephone Encounter (Signed)
Spoke to patient and informed him that he needs to come into the office for a MMR booster because he is not immune.

## 2014-06-13 ENCOUNTER — Encounter: Payer: Self-pay | Admitting: Adult Health

## 2014-06-13 ENCOUNTER — Ambulatory Visit (INDEPENDENT_AMBULATORY_CARE_PROVIDER_SITE_OTHER): Payer: 59 | Admitting: Adult Health

## 2014-06-13 VITALS — BP 140/94 | Temp 99.3°F | Ht 70.5 in | Wt 274.4 lb

## 2014-06-13 DIAGNOSIS — Z720 Tobacco use: Secondary | ICD-10-CM

## 2014-06-13 DIAGNOSIS — I10 Essential (primary) hypertension: Secondary | ICD-10-CM

## 2014-06-13 DIAGNOSIS — Z7189 Other specified counseling: Secondary | ICD-10-CM | POA: Diagnosis not present

## 2014-06-13 DIAGNOSIS — Z7689 Persons encountering health services in other specified circumstances: Secondary | ICD-10-CM

## 2014-06-13 DIAGNOSIS — Z760 Encounter for issue of repeat prescription: Secondary | ICD-10-CM | POA: Diagnosis not present

## 2014-06-13 DIAGNOSIS — F17200 Nicotine dependence, unspecified, uncomplicated: Secondary | ICD-10-CM

## 2014-06-13 MED ORDER — OMEPRAZOLE 40 MG PO CPDR: 40.0000 mg | DELAYED_RELEASE_CAPSULE | Freq: Every day | ORAL | Status: DC

## 2014-06-13 MED ORDER — LISINOPRIL-HYDROCHLOROTHIAZIDE 10-12.5 MG PO TABS: 1.0000 | ORAL_TABLET | Freq: Every day | ORAL | Status: DC
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
## 2014-06-13 NOTE — Progress Notes (Signed)
Pre visit review using our clinic review tool, if applicable. No additional management support is needed unless otherwise documented below in the visit note. 

## 2014-06-13 NOTE — Patient Instructions (Addendum)
Follow up with me in August for a complete physical we will do your blood work.   Work on 8 pound weight reduction by August.   It was great seeing you again! Let me know if there is anything I can do for you.      Smoking Cessation Quitting smoking is important to your health and has many advantages. However, it is not always easy to quit since nicotine is a very addictive drug. Oftentimes, people try 3 times or more before being able to quit. This document explains the best ways for you to prepare to quit smoking. Quitting takes hard work and a lot of effort, but you can do it. ADVANTAGES OF QUITTING SMOKING  You will live longer, feel better, and live better.  Your body will feel the impact of quitting smoking almost immediately.  Within 20 minutes, blood pressure decreases. Your pulse returns to its normal level.  After 8 hours, carbon monoxide levels in the blood return to normal. Your oxygen level increases.  After 24 hours, the chance of having a heart attack starts to decrease. Your breath, hair, and body stop smelling like smoke.  After 48 hours, damaged nerve endings begin to recover. Your sense of taste and smell improve.  After 72 hours, the body is virtually free of nicotine. Your bronchial tubes relax and breathing becomes easier.  After 2 to 12 weeks, lungs can hold more air. Exercise becomes easier and circulation improves.  The risk of having a heart attack, stroke, cancer, or lung disease is greatly reduced.  After 1 year, the risk of coronary heart disease is cut in half.  After 5 years, the risk of stroke falls to the same as a nonsmoker.  After 10 years, the risk of lung cancer is cut in half and the risk of other cancers decreases significantly.  After 15 years, the risk of coronary heart disease drops, usually to the level of a nonsmoker.  If you are pregnant, quitting smoking will improve your chances of having a healthy baby.  The people you live  with, especially any children, will be healthier.  You will have extra money to spend on things other than cigarettes. QUESTIONS TO THINK ABOUT BEFORE ATTEMPTING TO QUIT You may want to talk about your answers with your health care provider.  Why do you want to quit?  If you tried to quit in the past, what helped and what did not?  What will be the most difficult situations for you after you quit? How will you plan to handle them?  Who can help you through the tough times? Your family? Friends? A health care provider?  What pleasures do you get from smoking? What ways can you still get pleasure if you quit? Here are some questions to ask your health care provider:  How can you help me to be successful at quitting?  What medicine do you think would be best for me and how should I take it?  What should I do if I need more help?  What is smoking withdrawal like? How can I get information on withdrawal? GET READY  Set a quit date.  Change your environment by getting rid of all cigarettes, ashtrays, matches, and lighters in your home, car, or work. Do not let people smoke in your home.  Review your past attempts to quit. Think about what worked and what did not. GET SUPPORT AND ENCOURAGEMENT You have a better chance of being successful if you have  help. You can get support in many ways.  Tell your family, friends, and coworkers that you are going to quit and need their support. Ask them not to smoke around you.  Get individual, group, or telephone counseling and support. Programs are available at [facility]. Call your local health department for information about programs in your area.  Spiritual beliefs and practices may help some smokers quit.  Download a "quit meter" on your computer to keep track of quit statistics, such as how long you have gone without smoking, cigarettes not smoked, and money saved.  Get a self-help book about quitting smoking and  staying off tobacco. LEARN NEW SKILLS AND BEHAVIORS  Distract yourself from urges to smoke. Talk to someone, go for a walk, or occupy your time with a task.  Change your normal routine. Take a different route to work. Drink tea instead of coffee. Eat breakfast in a different place.  Reduce your stress. Take a hot bath, exercise, or read a book.  Plan something enjoyable to do every day. Reward yourself for not smoking.  Explore interactive web-based programs that specialize in helping you quit. GET MEDICINE AND USE IT CORRECTLY Medicines can help you stop smoking and decrease the urge to smoke. Combining medicine with the above behavioral methods and support can greatly increase your chances of successfully quitting smoking.  Nicotine replacement therapy helps deliver nicotine to your body without the negative effects and risks of smoking. Nicotine replacement therapy includes nicotine gum, lozenges, inhalers, nasal sprays, and skin patches. Some may be available over-the-counter and others require a prescription.  Antidepressant medicine helps people abstain from smoking, but how this works is unknown. This medicine is available by prescription.  Nicotinic receptor partial agonist medicine simulates the effect of nicotine in your brain. This medicine is available by prescription. Ask your health care provider for advice about which medicines to use and how to use them based on your health history. Your health care provider will tell you what side effects to look out for if you choose to be on a medicine or therapy. Carefully read the information on the package. Do not use any other product containing nicotine while using a nicotine replacement product.  RELAPSE OR DIFFICULT SITUATIONS Most relapses occur within the first 3 months after quitting. Do not be discouraged if you start smoking again. Remember, most people try several times before finally quitting. You may have symptoms of withdrawal  because your body is used to nicotine. You may crave cigarettes, be irritable, feel very hungry, cough often, get headaches, or have difficulty concentrating. The withdrawal symptoms are only temporary. They are strongest when you first quit, but they will go away within 10-14 days. To reduce the chances of relapse, try to:  Avoid drinking alcohol. Drinking lowers your chances of successfully quitting.  Reduce the amount of caffeine you consume. Once you quit smoking, the amount of caffeine in your body increases and can give you symptoms, such as a rapid heartbeat, sweating, and anxiety.  Avoid smokers because they can make you want to smoke.  Do not let weight gain distract you. Many smokers will gain weight when they quit, usually less than 10 pounds. Eat a healthy diet and stay active. You can always lose the weight gained after you quit.  Find ways to improve your mood other than smoking. FOR MORE INFORMATION  www.smokefree.gov  Document Released: 12/18/2000 Document Revised: 05/10/2013 Document Reviewed: 04/04/2011 [facility] Patient Information 2015 [employer] [place].  This information is not intended to replace advice given to you by your health care provider. Make sure you discuss any questions you have with your health care provider.

## 2014-06-30 ENCOUNTER — Ambulatory Visit (INDEPENDENT_AMBULATORY_CARE_PROVIDER_SITE_OTHER): Payer: 59

## 2014-06-30 DIAGNOSIS — Z23 Encounter for immunization: Secondary | ICD-10-CM

## 2014-09-13 ENCOUNTER — Ambulatory Visit (INDEPENDENT_AMBULATORY_CARE_PROVIDER_SITE_OTHER): Payer: 59 | Admitting: Adult Health

## 2014-09-13 ENCOUNTER — Encounter: Payer: Self-pay | Admitting: Adult Health

## 2014-09-13 VITALS — BP 128/92 | HR 96 | Temp 98.5°F | Ht 71.0 in | Wt 284.5 lb

## 2014-09-13 DIAGNOSIS — F329 Major depressive disorder, single episode, unspecified: Secondary | ICD-10-CM

## 2014-09-13 DIAGNOSIS — I10 Essential (primary) hypertension: Secondary | ICD-10-CM

## 2014-09-13 DIAGNOSIS — F32A Depression, unspecified: Secondary | ICD-10-CM | POA: Insufficient documentation

## 2014-09-13 MED ORDER — BUPROPION HCL ER (XL) 150 MG PO TB24: 150.0000 mg | ORAL_TABLET | Freq: Every day | ORAL | Status: DC

## 2014-09-13 NOTE — Progress Notes (Signed)
   Subjective:    Patient ID: Joseph Barry, male    DOB: 1980-04-04, 34 y.o.   MRN: [id number]  HPI  34 year old male who presents to the office for follow up regarding his blood pressure and would like to talk about his depression. Per patient, he has been depressed for years, he feels as though life is not working out, feels like there is always something to get done and that he can never get caught up. He and has wife have been having realtionship problems or the last 10 years. They have been seeing a counselor, but quit three months ago after Brode's arm injury  He does not feel like this was helping. " Everything has just been building up on each other. " He denies any SI or self harm. His brother killed himself and he knows what this did to his family.   PHQ9 shows score of 18.   His blood pressure in the office today was 128/98. He did not take his BP medication this morning. He has not been taking it every day. Has not been monitoring at home. Denies any headaches, blurred vision or dizziness.    Review of Systems  Constitutional: Positive for activity change.  HENT: Negative.   Eyes: Negative.   Cardiovascular: Negative.   Gastrointestinal: Negative.   Endocrine: Negative.   Genitourinary: Negative.   Musculoskeletal: Negative.   Neurological: Negative.   Hematological: Negative.   Psychiatric/Behavioral: Positive for decreased concentration. Negative for suicidal ideas, behavioral problems, confusion, sleep disturbance, self-injury and agitation. The patient is not nervous/anxious.   All other systems reviewed and are negative.      Objective:   Physical Exam  Constitutional: He is oriented to person, place, and time. He appears well-developed and well-nourished. No distress.  Cardiovascular: Normal rate, regular rhythm and normal heart sounds.  Exam reveals no gallop and no friction rub.   No murmur heard. Pulmonary/Chest: Effort normal and breath sounds normal. No  respiratory distress. He has no wheezes. He has no rales. He exhibits no tenderness.  Neurological: He is alert and oriented to person, place, and time.  Skin: Skin is warm and dry. No rash noted. He is not diaphoretic. No erythema. No pallor.  Psychiatric: He has a normal mood and affect. His behavior is normal. Judgment and thought content normal.  Nursing note and vitals reviewed.      Assessment & Plan:  1. Essential hypertension - Continue with current therapy - Follow up in one month for CPE - Monitor BP at home and take medication daily  2. Depression - buPROPion (WELLBUTRIN XL) 150 MG 24 hr tablet; Take 1 tablet (150 mg total) by mouth daily.  Dispense: 30 tablet; Refill: 3 - Go to the ER with any thoughts of SI or self harm

## 2014-09-13 NOTE — Progress Notes (Signed)
Pre visit review using our clinic review tool, if applicable. No additional management support is needed unless otherwise documented below in the visit note. 

## 2014-09-13 NOTE — Patient Instructions (Signed)
It was great seeing you again!  Please follow up in one month for a complete physical  Start monitoring your blood pressure at home and bring along that log with you.   If you need anything, please let me know.

## 2014-10-11 ENCOUNTER — Other Ambulatory Visit (INDEPENDENT_AMBULATORY_CARE_PROVIDER_SITE_OTHER): Payer: 59

## 2014-10-11 ENCOUNTER — Ambulatory Visit (INDEPENDENT_AMBULATORY_CARE_PROVIDER_SITE_OTHER): Payer: 59

## 2014-10-11 DIAGNOSIS — Z23 Encounter for immunization: Secondary | ICD-10-CM

## 2014-10-11 DIAGNOSIS — Z0000 Encounter for general adult medical examination without abnormal findings: Secondary | ICD-10-CM | POA: Diagnosis not present

## 2014-10-11 LAB — CBC WITH DIFFERENTIAL/PLATELET
BASOS ABS: 0 10*3/uL (ref 0.0–0.1)
Basophils Relative: 0.4 % (ref 0.0–3.0)
EOS ABS: 0.2 10*3/uL (ref 0.0–0.7)
Eosinophils Relative: 3.2 % (ref 0.0–5.0)
HCT: 44.5 % (ref 39.0–52.0)
HEMOGLOBIN: 15.1 g/dL (ref 13.0–17.0)
Lymphocytes Relative: 26 % (ref 12.0–46.0)
Lymphs Abs: 1.6 10*3/uL (ref 0.7–4.0)
MCHC: 33.8 g/dL (ref 30.0–36.0)
MCV: 89.9 fl (ref 78.0–100.0)
MONO ABS: 0.4 10*3/uL (ref 0.1–1.0)
Monocytes Relative: 5.6 % (ref 3.0–12.0)
Neutro Abs: 4.1 10*3/uL (ref 1.4–7.7)
Neutrophils Relative %: 64.8 % (ref 43.0–77.0)
Platelets: 388 10*3/uL (ref 150.0–400.0)
RBC: 4.96 Mil/uL (ref 4.22–5.81)
RDW: 13.2 % (ref 11.5–15.5)
WBC: 6.3 10*3/uL (ref 4.0–10.5)

## 2014-10-11 LAB — BASIC METABOLIC PANEL
BUN: 9 mg/dL (ref 6–23)
CO2: 29 mEq/L (ref 19–32)
CREATININE: 1.19 mg/dL (ref 0.40–1.50)
Calcium: 9.8 mg/dL (ref 8.4–10.5)
Chloride: 102 mEq/L (ref 96–112)
GFR: 89.8 mL/min (ref >60.00)
Glucose, Bld: 135 mg/dL — ABNORMAL HIGH (ref 70–99)
Potassium: 4.1 mEq/L (ref 3.5–5.1)
Sodium: 140 mEq/L (ref 135–145)

## 2014-10-11 LAB — LIPID PANEL
Cholesterol: 193 mg/dL (ref 0–200)
HDL: 39.7 mg/dL (ref >39.00)
LDL Cholesterol: 135 mg/dL — ABNORMAL HIGH (ref 0–99)
NONHDL: 153.64
TRIGLYCERIDES: 92 mg/dL (ref 0.0–149.0)
Total CHOL/HDL Ratio: 5
VLDL: 18.4 mg/dL (ref 0.0–40.0)

## 2014-10-11 LAB — POCT URINALYSIS DIPSTICK
BILIRUBIN UA: NEGATIVE
Blood, UA: NEGATIVE
GLUCOSE UA: NEGATIVE
LEUKOCYTES UA: NEGATIVE
NITRITE UA: NEGATIVE
PH UA: 6.5
Spec Grav, UA: >=1.03
Urobilinogen, UA: 0.2

## 2014-10-11 LAB — HEPATIC FUNCTION PANEL
ALBUMIN: 4.1 g/dL (ref 3.5–5.2)
ALT: 21 U/L (ref 0–53)
AST: 15 U/L (ref 0–37)
Alkaline Phosphatase: 62 U/L (ref 39–117)
Bilirubin, Direct: 0.1 mg/dL (ref 0.0–0.3)
TOTAL PROTEIN: 7.2 g/dL (ref 6.0–8.3)
Total Bilirubin: 0.4 mg/dL (ref 0.2–1.2)

## 2014-10-11 LAB — TSH: TSH: 1.08 u[IU]/mL (ref 0.35–4.50)

## 2014-10-12 ENCOUNTER — Encounter: Payer: Self-pay | Admitting: Adult Health

## 2014-10-12 ENCOUNTER — Other Ambulatory Visit (INDEPENDENT_AMBULATORY_CARE_PROVIDER_SITE_OTHER): Payer: 59

## 2014-10-12 DIAGNOSIS — R739 Hyperglycemia, unspecified: Secondary | ICD-10-CM | POA: Diagnosis not present

## 2014-10-12 LAB — HEMOGLOBIN A1C: Hgb A1c MFr Bld: 6.9 % — ABNORMAL HIGH (ref 4.6–6.5)

## 2014-10-28 ENCOUNTER — Encounter: Payer: Self-pay | Admitting: Adult Health

## 2014-10-28 ENCOUNTER — Ambulatory Visit (INDEPENDENT_AMBULATORY_CARE_PROVIDER_SITE_OTHER): Payer: 59 | Admitting: Adult Health

## 2014-10-28 VITALS — BP 122/92 | Temp 99.1°F | Ht 71.0 in | Wt 275.1 lb

## 2014-10-28 DIAGNOSIS — R809 Proteinuria, unspecified: Secondary | ICD-10-CM

## 2014-10-28 DIAGNOSIS — IMO0001 Reserved for inherently not codable concepts without codable children: Secondary | ICD-10-CM

## 2014-10-28 DIAGNOSIS — F17200 Nicotine dependence, unspecified, uncomplicated: Secondary | ICD-10-CM

## 2014-10-28 DIAGNOSIS — E1165 Type 2 diabetes mellitus with hyperglycemia: Secondary | ICD-10-CM

## 2014-10-28 DIAGNOSIS — E669 Obesity, unspecified: Secondary | ICD-10-CM

## 2014-10-28 DIAGNOSIS — F32A Depression, unspecified: Secondary | ICD-10-CM

## 2014-10-28 DIAGNOSIS — Z0000 Encounter for general adult medical examination without abnormal findings: Secondary | ICD-10-CM | POA: Diagnosis not present

## 2014-10-28 DIAGNOSIS — F329 Major depressive disorder, single episode, unspecified: Secondary | ICD-10-CM | POA: Diagnosis not present

## 2014-10-28 MED ORDER — ONETOUCH DELICA LANCETS FINE MISC: Status: DC

## 2014-10-28 MED ORDER — BUPROPION HCL ER (XL) 150 MG PO TB24: 150.0000 mg | ORAL_TABLET | Freq: Every day | ORAL | Status: DC

## 2014-10-28 MED ORDER — GLUCOSE BLOOD VI STRP: ORAL_STRIP | Status: DC

## 2014-10-28 MED ORDER — PHENTERMINE HCL 15 MG PO CAPS: 15.0000 mg | ORAL_CAPSULE | ORAL | Status: DC

## 2014-10-28 NOTE — Progress Notes (Deleted)
Subjective:    Patient ID: Joseph Barry, male    DOB: 01/14/1980, 34 y.o.   MRN: [id number]  HPI   HPI:  Joseph Barry is here to establish care.  Last PCP and physical:  Has the following chronic problems that require follow up and concerns today:   ROS negative for unless reported above: fevers, chills,feeling poorly, unintentional weight loss, hearing or vision loss, chest pain, palpitations, leg claudication, struggling to breath,Not feeling congested in the chest, no orthopenia, no cough,no wheezing, normal appetite, no soft tissue swelling, no hemoptysis, melena, hematochezia, hematuria, falls, loc, si, or thoughts of self harm.  Immunizations: Diet: Exercise: Colonoscopy: Dexa: Pap Smear: Mammogram:  Past Medical History  Diagnosis Date  . GERD (gastroesophageal reflux disease)   . Migraines   . Hypertension     states under control with med., has been on med. x 1 yr.  . History of cardiac murmur as a child   . Sleep apnea     uses CPAP nightly  . Eczema   . History of MRSA infection ~ 2007    back  . Finger laceration involving tendon 05/20/2014    right index, long, ring, small fingers - tendon/artery/nerve lac.    Past Surgical History  Procedure Laterality Date  . Orbital fracture surgery Left 2003  . Nerve, tendon and artery repair Right 05/24/2014    Procedure: RIGHT INDEX LONG RING SMALL/REPAIR TENDON, ARTERY NERVE;  Surgeon: Betha LoaKevin Kuzma, MD;  Location: [facility];  Service: Orthopedics;  Laterality: Right;    Family History  Problem Relation Age of Onset  . Arthritis Maternal Grandmother   . Hypertension Father   . Hypertension Mother   . Diabetes Father   . Heart disease Mother   . COPD Mother   . Cancer Paternal Grandmother     spine cancer    Social History   Social History  . Marital Status: Married    Spouse Name: N/A  . Number of Children: N/A  . Years of Education: N/A   Social History Main Topics  .  Smoking status: Current Every Day Smoker -- 5 years    Types: Cigars  . Smokeless tobacco: Never Used     Comment: 5 cigars/day  . Alcohol Use: 0.0 oz/week    0 Standard drinks or equivalent per week     Comment: occasionally  . Drug Use: No  . Sexual Activity: Not Asked   Other Topics Concern  . None   Social History Narrative   Going to school for [employer]. Starts in August.    Married for five years   Has step daughter.    Has rabbit and dog.         Current outpatient prescriptions:  .  lisinopril-hydrochlorothiazide (PRINZIDE,ZESTORETIC) 10-12.5 MG per tablet, Take 1 tablet by mouth daily., Disp: 90 tablet, Rfl: 3 .  omeprazole (PRILOSEC) 40 MG capsule, Take 1 capsule (40 mg total) by mouth daily., Disp: 90 capsule, Rfl: 3 .  buPROPion (WELLBUTRIN XL) 150 MG 24 hr tablet, Take 1 tablet (150 mg total) by mouth daily. (Patient not taking: Reported on 10/28/2014), Disp: 30 tablet, Rfl: 3  EXAM:  Filed Vitals:   10/28/14 1359  BP: 122/92  Temp: 99.1 F (37.3 C)    Body mass index is 38.39 kg/(m^2).  GENERAL: vitals reviewed and listed above, alert, oriented, appears well hydrated and in no acute distress  HEENT: atraumatic, conjunttiva clear, no obvious abnormalities on inspection of external  nose and ears  NECK: Neck is soft and supple without masses, no adenopathy or thyromegaly, trachea midline, no JVD. Normal range of motion.   LUNGS: clear to auscultation bilaterally, no wheezes, rales or rhonchi, good air movement  CV: Regular rate and rhythm, normal S1/S2, no audible murmurs, gallops, or rubs. No carotid bruit and no peripheral edema.   MS: moves all extremities without noticeable abnormality. No edema noted  Abd: soft/nontender/nondistended/normal bowel sounds   Skin: warm and dry, no rash   Extremities: No clubbing, cyanosis, or edema. Capillary refill is WNL. Pulses intact bilaterally in upper and lower extremities.   Neuro: CN II-XII  intact, sensation and reflexes normal throughout, 5/5 muscle strength in bilateral upper and lower extremities. Normal finger to nose. Normal rapid alternating movements. Normal romberg. No pronator drift.   PSYCH: pleasant and cooperative, no obvious depression or anxiety  ASSESSMENT AND PLAN:  Discussed the following assessment and plan:  No diagnosis found. -We reviewed the PMH, PSH, FH, SH, Meds and Allergies. -We provided refills for any medications we will prescribe as needed. -We addressed current concerns per orders and patient instructions. -We have asked for records for pertinent exams, studies, vaccines and notes from previous providers. -We have advised patient to follow up per instructions below.   -Patient advised to return or notify a provider immediately if symptoms worsen or persist or new concerns arise.  There are no Patient Instructions on file for this visit.   Milessa Hogan      Review of Systems     Objective:   Physical Exam        Assessment & Plan:

## 2014-10-28 NOTE — Progress Notes (Signed)
Pre visit review using our clinic review tool, if applicable. No additional management support is needed unless otherwise documented below in the visit note. 

## 2014-10-28 NOTE — Patient Instructions (Addendum)
It was great seeing you again today!  I have sent in a prescription for the Wellbutrin   You have a prescription for the Phentermine - use this as directed. At the end of the month call or send a message with your weight. If you have any side effects please let me know.   Start monitoring your blood sugars at home and writing them in a log.   Work on a diabetic diet and exercise regamine.   Use Imodium for the diarrhea as needed.   Diabetes Mellitus and Food It is important for you to manage your blood sugar (glucose) level. Your blood glucose level can be greatly affected by what you eat. Eating healthier foods in the appropriate amounts throughout the day at about the same time each day will help you control your blood glucose level. It can also help slow or prevent worsening of your diabetes mellitus. Healthy eating may even help you improve the level of your blood pressure and reach or maintain a healthy weight.  General recommendations for healthful eating and cooking habits include:  Eating meals and snacks regularly. Avoid going long periods of time without eating to lose weight.  Eating a diet that consists mainly of plant-based foods, such as fruits, vegetables, nuts, legumes, and whole grains.  Using low-heat cooking methods, such as baking, instead of high-heat cooking methods, such as deep frying. Work with your dietitian to make sure you understand how to use the Nutrition Facts information on food labels. HOW CAN FOOD AFFECT ME? Carbohydrates Carbohydrates affect your blood glucose level more than any other type of food. Your dietitian will help you determine how many carbohydrates to eat at each meal and teach you how to count carbohydrates. Counting carbohydrates is important to keep your blood glucose at a healthy level, especially if you are using insulin or taking certain medicines for diabetes mellitus. Alcohol Alcohol can cause sudden decreases in blood glucose  (hypoglycemia), especially if you use insulin or take certain medicines for diabetes mellitus. Hypoglycemia can be a life-threatening condition. Symptoms of hypoglycemia (sleepiness, dizziness, and disorientation) are similar to symptoms of having too much alcohol.  If your health care provider has given you approval to drink alcohol, do so in moderation and use the following guidelines:  Women should not have more than one drink per day, and men should not have more than two drinks per day. One drink is equal to:  12 oz of beer.  5 oz of wine.  1 oz of hard liquor.  Do not drink on an empty stomach.  Keep yourself hydrated. Have water, diet soda, or unsweetened iced tea.  Regular soda, juice, and other mixers might contain a lot of carbohydrates and should be counted. WHAT FOODS ARE NOT RECOMMENDED? As you make food choices, it is important to remember that all foods are not the same. Some foods have fewer nutrients per serving than other foods, even though they might have the same number of calories or carbohydrates. It is difficult to get your body what it needs when you eat foods with fewer nutrients. Examples of foods that you should avoid that are high in calories and carbohydrates but low in nutrients include:  Trans fats (most processed foods list trans fats on the Nutrition Facts label).  Regular soda.  Juice.  Candy.  Sweets, such as cake, pie, doughnuts, and cookies.  Fried foods. WHAT FOODS CAN I EAT? Eat nutrient-rich foods, which will nourish your body and keep you  healthy. The food you should eat also will depend on several factors, including:  The calories you need.  The medicines you take.  Your weight.  Your blood glucose level.  Your blood pressure level.  Your cholesterol level. You should eat a variety of foods, including:  Protein.  Lean cuts of meat.  Proteins low in saturated fats, such as fish, egg whites, and beans. Avoid processed  meats.  Fruits and vegetables.  Fruits and vegetables that may help control blood glucose levels, such as apples, mangoes, and yams.  Dairy products.  Choose fat-free or low-fat dairy products, such as milk, yogurt, and cheese.  Grains, bread, pasta, and rice.  Choose whole grain products, such as multigrain bread, whole oats, and brown rice. These foods may help control blood pressure.  Fats.  Foods containing healthful fats, such as nuts, avocado, olive oil, canola oil, and fish. DOES EVERYONE WITH DIABETES MELLITUS HAVE THE SAME MEAL PLAN? Because every person with diabetes mellitus is different, there is not one meal plan that works for everyone. It is very important that you meet with a dietitian who will help you create a meal plan that is just right for you.   This information is not intended to replace advice given to you by your health care provider. Make sure you discuss any questions you have with your health care provider.   Document Released: 09/20/2004 Document Revised: 01/14/2014 Document Reviewed: 11/20/2012 [employer] Interactive Patient Education [employer]

## 2014-10-28 NOTE — Progress Notes (Signed)
Subjective:    Patient ID: Joseph Barry, male    DOB: May 19, 1980, 34 y.o.   MRN: [id number]  HPI  Patient presents for yearly preventative medicine examination. Medicare questionnaire was completed  All immunizations and health maintenance protocols were reviewed with the patient and needed orders were placed.  Appropriate screening laboratory values were reviewed with the patient including screening of hyperlipidemia, renal function and hepatic function.  Medication reconciliation,  past medical history, social history, problem list and allergies were reviewed in detail with the patient  Goals were established with regard to weight loss, exercise, and  diet in compliance with medications  Diabetes Type 2, uncontrolled - 9 months ago his A1c was 7.2. Today A1c is 6.9. - he has been diagnosed with diabetes type 2, uncontrolled he was unaware of this diagnosis. - He has not been exercising and not watching his diet - is not currently on any medications her diabetes  Depression -  Was started on Wellbutrin at his last visit in September 2016. - feels as though his depression is not getting worse. He endorses that his wife says that he seems to be less depressed - unfortunately he's been without his Wellbutrin for the last couple weeks. He is unable to find it and is currently in the process of moving he believes impacted in a box but is unsure where it is. He has been experiencing abdominal discomfort and headaches since discontinuing the Wellbutrin  Diarrhea - he endorses that over the last couple of months he's been experiencing bouts of diarrhea right after eating meals. As noted above he has not changed his diet in any way. Does have a history of lactose intolerance, he no longer drinks milk, instead drinks Lactaid. He does continue to eat cheese.  Does not feel as though these bouts of diarrhea have anything to do with his lactose intolerance. He has not noticed that any specific  foods give him diarrhea. Denies any travel, recent antibiotic use, or sick contacts.  Only has diarrhea after eating.   Review of Systems  Constitutional: Negative.   HENT: Negative.   Eyes: Negative.   Respiratory: Negative.   Cardiovascular: Negative.   Gastrointestinal: Positive for diarrhea. Negative for nausea, vomiting, abdominal pain, constipation, blood in stool, abdominal distention, anal bleeding and rectal pain.  Endocrine: Negative.   Genitourinary: Negative.   Musculoskeletal: Negative.   Skin: Negative.   Neurological: Negative.   Hematological: Negative.   Psychiatric/Behavioral: Negative for suicidal ideas, sleep disturbance, self-injury, decreased concentration and agitation. The patient is not nervous/anxious.        Depression   Past Medical History  Diagnosis Date  . GERD (gastroesophageal reflux disease)   . Migraines   . Hypertension     states under control with med., has been on med. x 1 yr.  . History of cardiac murmur as a child   . Sleep apnea     uses CPAP nightly  . Eczema   . History of MRSA infection ~ 2007    back  . Finger laceration involving tendon 05/20/2014    right index, long, ring, small fingers - tendon/artery/nerve lac.  . Diabetes mellitus type II, uncontrolled (HCC)     Social History   Social History  . Marital Status: Married    Spouse Name: N/A  . Number of Children: N/A  . Years of Education: N/A   Occupational History  . Not on file.   Social History Main Topics  . Smoking status:  Current Every Day Smoker -- 5 years    Types: Cigars  . Smokeless tobacco: Never Used     Comment: 5 cigars/day  . Alcohol Use: 0.0 oz/week    0 Standard drinks or equivalent per week     Comment: occasionally  . Drug Use: No  . Sexual Activity: Not on file   Other Topics Concern  . 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    Married for five years   Has step daughter.     Has rabbit and dog.        Past Surgical History  Procedure Laterality Date  . Orbital fracture surgery Left 2003  . Nerve, tendon and artery repair Right 05/24/2014    Procedure: RIGHT INDEX LONG RING SMALL/REPAIR TENDON, ARTERY NERVE;  Surgeon: Betha Loa, MD;  Location: [facility];  Service: Orthopedics;  Laterality: Right;    Family History  Problem Relation Age of Onset  . Arthritis Maternal Grandmother   . Hypertension Father   . Hypertension Mother   . Diabetes Father   . Heart disease Mother   . COPD Mother   . Cancer Paternal Grandmother     spine cancer  . Colon polyps Father     Allergies  Allergen Reactions  . Shellfish Allergy Shortness Of Breath and Swelling    Current Outpatient Prescriptions on File Prior to Visit  Medication Sig Dispense Refill  . lisinopril-hydrochlorothiazide (PRINZIDE,ZESTORETIC) 10-12.5 MG per tablet Take 1 tablet by mouth daily. 90 tablet 3  . omeprazole (PRILOSEC) 40 MG capsule Take 1 capsule (40 mg total) by mouth daily. 90 capsule 3   No current facility-administered medications on file prior to visit.    BP 122/92 mmHg  Temp(Src) 99.1 F (37.3 C) (Oral)  Ht  (1.803 m)  Wt 275 lb 1.6 oz (124.785 kg)  BMI 38.39 kg/m2       Objective:   Physical Exam  Constitutional: He is oriented to person, place, and time. He appears well-developed and well-nourished. No distress.  HENT:  Head: Normocephalic and atraumatic.  Right Ear: External ear normal.  Left Ear: External ear normal.  Nose: Nose normal.  Mouth/Throat: Oropharynx is clear and moist. No oropharyngeal exudate.  Eyes: Conjunctivae and EOM are normal. Pupils are equal, round, and reactive to light. Right eye exhibits no discharge. Left eye exhibits no discharge. No scleral icterus.  Neck: Normal range of motion. Neck supple. No JVD present. No tracheal deviation present. No thyromegaly present.  Cardiovascular: Normal rate, regular rhythm,  normal heart sounds and intact distal pulses.  Exam reveals no gallop and no friction rub.   No murmur heard. Pulmonary/Chest: Effort normal and breath sounds normal. No stridor. No respiratory distress. He has no wheezes. He has no rales. He exhibits no tenderness.  Abdominal: Soft. Bowel sounds are normal. He exhibits no distension and no mass. There is no tenderness. There is no rebound and no guarding.  obese  Genitourinary: Rectum normal, prostate normal and penis normal. Guaiac negative stool. No penile tenderness.  Musculoskeletal: Normal range of motion. He exhibits no edema or tenderness.  Lymphadenopathy:    He has no cervical adenopathy.  Neurological: He is alert and oriented to person, place, and time. He has normal reflexes. He displays normal reflexes. No cranial nerve deficit. He exhibits normal muscle tone. Coordination normal.  Skin: Skin is warm and dry. No rash noted. No erythema.  No pallor.  Psychiatric: He has a normal mood and affect. His behavior is normal. Judgment and thought content normal.  Nursing note and vitals reviewed.      Assessment & Plan:  1. Routine general medical examination at a health care facility - Follow-up in 1 year for complete physical exam -  Follow up sooner if needed - Encouraged healthy eating and exercise -  Encouraged to quit smoking  2. Depression - buPROPion (WELLBUTRIN XL) 150 MG 24 hr tablet; Take 1 tablet (150 mg total) by mouth daily.  Dispense: 30 tablet; Refill: 3 - follow-up in the ER with any thoughts of suicide or self-harm  3. Uncontrolled type 2 diabetes mellitus without complication, without long-term current use of insulin (HCC) -. Lab Results  Component Value Date   HGBA1C 6.9* 10/12/2014  - he will work on diet and exercise program next 3 months. At that time we will retest his A1c , and if needed started him on metformin. - Agricultural engineer given to the patient on diabetes type 2 - any questions patient had  were answered - he was advised to go home look over educational material, as well as do research on his own about diabetes - he can follow-up with me at any time if he has any questions - Amb Referral to Nutrition and Diabetic E - ONETOUCH DELICA LANCETS FINE MISC; Test twice daily  Dispense: 100 each; Refill: 5 - glucose blood (ONETOUCH VERIO) test strip; Test twice daily  Dispense: 100 each; Refill: 5  4. Proteinuria - POC Urinalysis Dipstick- Resolved  5. Obesity  - phentermine 15 MG capsule; Take 1 capsule (15 mg total) by mouth every morning.  Dispense: 30 capsule; Refill: 3 - he understands the importance of diet and exercise in conjunction with this medication - he will follow-up in one month via phone call or my chart with his current weight. At that time we can consider going up on phentermine dosage  6. Tobacco use disorder - He is working on reducing the amount of cigarettes he smokes. Does not want any additional help at this time - buPROPion (WELLBUTRIN XL) 150 MG 24 hr tablet; Take 1 tablet (150 mg total) by mouth daily.  Dispense: 30 tablet; Refill: 3

## 2014-10-31 ENCOUNTER — Encounter: Payer: Self-pay | Admitting: Adult Health

## 2014-10-31 LAB — POCT URINALYSIS DIPSTICK
BILIRUBIN UA: NEGATIVE
Blood, UA: NEGATIVE
GLUCOSE UA: NEGATIVE
Ketones, UA: NEGATIVE
LEUKOCYTES UA: NEGATIVE
NITRITE UA: NEGATIVE
Protein, UA: NEGATIVE
Spec Grav, UA: 1.025
Urobilinogen, UA: 0.2
pH, UA: 6

## 2014-11-03 ENCOUNTER — Encounter: Payer: 59 | Attending: Adult Health | Admitting: *Deleted

## 2014-11-03 ENCOUNTER — Encounter: Payer: Self-pay | Admitting: *Deleted

## 2014-11-03 VITALS — Ht 71.0 in | Wt 267.1 lb

## 2014-11-03 DIAGNOSIS — E119 Type 2 diabetes mellitus without complications: Secondary | ICD-10-CM | POA: Insufficient documentation

## 2014-11-03 DIAGNOSIS — Z713 Dietary counseling and surveillance: Secondary | ICD-10-CM | POA: Insufficient documentation

## 2014-11-03 NOTE — Patient Instructions (Signed)
Plan:  Aim for 4 Carb Choices per meal (60 grams) +/- 1 either way  Aim for 0-2 Carbs per snack if hungry  Include lean protein in moderation with your meals and snacks Consider reading food labels for Total Carbohydrate of foods Consider  increasing your activity level as tolerated Continue checking BG at alternate times per day as directed by MD

## 2014-11-03 NOTE — Progress Notes (Signed)
Diabetes Self-Management Education  Visit Type: First/Initial  Appt. Start Time: 1400 Appt. End Time: 1530  11/03/2014  Joseph Barry, identified by name and date of birth, is a 34 y.o. male with a diagnosis of Diabetes: Type 2.   ASSESSMENT  Height 5\' 11"  (1.803 m), weight 267 lb 1.6 oz (121.156 kg). Body mass index is 37.27 kg/(m^2).      Diabetes Self-Management Education - 11/03/14 1414    Visit Information   Visit Type First/Initial   Initial Visit   Diabetes Type Type 2   Are you currently following a meal plan? No   Are you taking your medications as prescribed? Not on Medications   Date Diagnosed 10/28/14   Health Coping   How would you rate your overall health? Good   Psychosocial Assessment   Patient Belief/Attitude about Diabetes Other (comment)  OKAY   Other persons present Patient   Patient Concerns Nutrition/Meal planning   Preferred Learning Style Auditory;Visual;Hands on   Learning Readiness Change in progress   How often do you need to have someone help you when you read instructions, pamphlets, or other written materials from your doctor or pharmacy? 1 - Never   What is the last grade level you completed in school? AA degree Curatorlectronic Engineering technology   Complications   Last HgB A1C per patient/outside source 6.9 %   How often do you check your blood sugar? 1-2 times/day   Fasting Blood glucose range (mg/dL) 191-478130-179   Postprandial Blood glucose range (mg/dL) 29-56270-129   Have you had a dilated eye exam in the past 12 months? Yes   Have you had a dental exam in the past 12 months? Yes   Are you checking your feet? No   Dietary Intake   Breakfast skips   Snack (morning) rarely, maybe PNB cracers or goldfish   Lunch skips usually OR more crackers OR chicken sandwich either grilled or fried    Snack (afternoon) crackers if hungry   Dinner eat out twice a week, when at home: meat, starch, vegetables, occasionally with bread   Snack (evening) not  usually, unless ice pop   Beverage(s) sweet tea, regular soda, sugar sweetened kool-aid   Exercise   Exercise Type ADL's   How many days per week to you exercise? 0   How many minutes per day do you exercise? 0   Total minutes per week of exercise 0   Patient Education   Previous Diabetes Education No   Disease state  Definition of diabetes, type 1 and 2, and the diagnosis of diabetes   Nutrition management  Role of diet in the treatment of diabetes and the relationship between the three main macronutrients and blood glucose level;Food label reading, portion sizes and measuring food.;Carbohydrate counting   Physical activity and exercise  Role of exercise on diabetes management, blood pressure control and cardiac health.   Monitoring Purpose and frequency of SMBG.;Identified appropriate SMBG and/or A1C goals.   Chronic complications Relationship between chronic complications and blood glucose control   Psychosocial adjustment Role of stress on diabetes   Individualized Goals (developed by patient)   Nutrition Follow meal plan discussed   Physical Activity Exercise 3-5 times per week   Medications Not Applicable   Monitoring  test blood glucose pre and post meals as discussed   Outcomes   Expected Outcomes Demonstrated interest in learning. Expect positive outcomes   Future DMSE 4-6 wks   Program Status Not Completed  Individualized Plan for Diabetes Self-Management Training:   Learning Objective:  Patient will have a greater understanding of diabetes self-management. Patient education plan is to attend individual and/or group sessions per assessed needs and concerns.   Plan:   Patient Instructions  Plan:  Aim for 4 Carb Choices per meal (60 grams) +/- 1 either way  Aim for 0-2 Carbs per snack if hungry  Include lean protein in moderation with your meals and snacks Consider reading food labels for Total Carbohydrate of foods Consider  increasing your activity level as  tolerated Continue checking BG at alternate times per day as directed by MD        Expected Outcomes:  Demonstrated interest in learning. Expect positive outcomes  Education material provided: Living Well with Diabetes, Meal plan card and Carbohydrate counting sheet  If problems or questions, patient to contact team via:  Phone and Email  Future DSME appointment: 4-6 wks

## 2014-12-15 ENCOUNTER — Ambulatory Visit: Payer: 59 | Admitting: *Deleted

## 2015-01-25 ENCOUNTER — Other Ambulatory Visit: Payer: Self-pay | Admitting: Adult Health

## 2015-01-27 ENCOUNTER — Ambulatory Visit (INDEPENDENT_AMBULATORY_CARE_PROVIDER_SITE_OTHER): Payer: 59 | Admitting: Adult Health

## 2015-01-27 VITALS — BP 122/90 | Temp 98.8°F | Ht 71.0 in | Wt 261.5 lb

## 2015-01-27 DIAGNOSIS — Z760 Encounter for issue of repeat prescription: Secondary | ICD-10-CM | POA: Diagnosis not present

## 2015-01-27 DIAGNOSIS — F17200 Nicotine dependence, unspecified, uncomplicated: Secondary | ICD-10-CM

## 2015-01-27 DIAGNOSIS — F329 Major depressive disorder, single episode, unspecified: Secondary | ICD-10-CM

## 2015-01-27 DIAGNOSIS — F32A Depression, unspecified: Secondary | ICD-10-CM

## 2015-01-27 DIAGNOSIS — E669 Obesity, unspecified: Secondary | ICD-10-CM | POA: Diagnosis not present

## 2015-01-27 DIAGNOSIS — E1165 Type 2 diabetes mellitus with hyperglycemia: Secondary | ICD-10-CM | POA: Diagnosis not present

## 2015-01-27 DIAGNOSIS — IMO0001 Reserved for inherently not codable concepts without codable children: Secondary | ICD-10-CM

## 2015-01-27 LAB — BASIC METABOLIC PANEL
BUN: 15 mg/dL (ref 6–23)
CO2: 30 mEq/L (ref 19–32)
Calcium: 10.1 mg/dL (ref 8.4–10.5)
Chloride: 102 mEq/L (ref 96–112)
Creatinine, Ser: 1.28 mg/dL (ref 0.40–1.50)
GFR: 82.41 mL/min (ref >60.00)
Glucose, Bld: 149 mg/dL — ABNORMAL HIGH (ref 70–99)
POTASSIUM: 5.2 meq/L — AB (ref 3.5–5.1)
SODIUM: 140 meq/L (ref 135–145)

## 2015-01-27 LAB — HEMOGLOBIN A1C: HEMOGLOBIN A1C: 6.8 % — AB (ref 4.6–6.5)

## 2015-01-27 MED ORDER — BUPROPION HCL ER (XL) 300 MG PO TB24: 300.0000 mg | ORAL_TABLET | Freq: Every day | ORAL | Status: DC

## 2015-01-27 MED ORDER — PHENTERMINE HCL 30 MG PO CAPS: 30.0000 mg | ORAL_CAPSULE | ORAL | Status: DC

## 2015-01-27 MED ORDER — OMEPRAZOLE 40 MG PO CPDR: 40.0000 mg | DELAYED_RELEASE_CAPSULE | Freq: Every day | ORAL | Status: DC

## 2015-01-27 NOTE — Patient Instructions (Signed)
It was great seeing you again!  I have sent in new prescription for Wellbutrin and Prilosec.   Continue to monitor blood sugars three times a week  Work on diet, exercise and quitting smoking.   Follow up in 6 months.

## 2015-01-27 NOTE — Progress Notes (Signed)
Subjective:    Patient ID: Joseph Barry, male    DOB: 31-Jul-1980, 35 y.o.   MRN: [id number]  HPI  35 year old male who presents to the office today for follow up regarding diabetes, hypertension and depression. He has been checking his blood sugars twice a day and reports that they have been elevated in the morning but go down throughout the day. His log shows blood sugars between 90-140. He is not on any medication for diabetes at this time.  Lab Results  Component Value Date   HGBA1C 6.9* 10/12/2014   He feels as though his depression is becoming more well controlled. He still has episodes of severe depression but he is able to pull himself out of it much easier now.   He quit smoking for a few weeks but has since started back up. He would like to quit completely.   His weight has gone down. He has lost 14 pounds since October. He is working on diet but has not started exercising yet. He plans to start soon.    Review of Systems  Constitutional: Negative.   HENT: Negative.   Respiratory: Negative.   Cardiovascular: Negative.   Gastrointestinal: Negative.   Neurological: Negative.   Psychiatric/Behavioral: Negative.   All other systems reviewed and are negative.  Past Medical History  Diagnosis Date  . GERD (gastroesophageal reflux disease)   . Migraines   . Hypertension     states under control with med., has been on med. x 1 yr.  . History of cardiac murmur as a child   . Sleep apnea     uses CPAP nightly  . Eczema   . History of MRSA infection ~ 2007    back  . Finger laceration involving tendon 05/20/2014    right index, long, ring, small fingers - tendon/artery/nerve lac.  . Diabetes mellitus type II, uncontrolled (HCC)     Social History   Social History  . Marital Status: Married    Spouse Name: N/A  . Number of Children: N/A  . Years of Education: N/A   Occupational History  . Not on file.   Social History Main Topics  . Smoking status: Current  Every Day Smoker -- 5 years    Types: Cigars  . Smokeless tobacco: Never Used     Comment: 5 cigars/day  . Alcohol Use: 0.0 oz/week    0 Standard drinks or equivalent per week     Comment: occasionally  . Drug Use: No  . Sexual Activity: Not on file   Other Topics Concern  . 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    Married for five years   Has step daughter.    Has rabbit and dog.        Past Surgical History  Procedure Laterality Date  . Orbital fracture surgery Left 2003  . Nerve, tendon and artery repair Right 05/24/2014    Procedure: RIGHT INDEX LONG RING SMALL/REPAIR TENDON, ARTERY NERVE;  Surgeon: Betha Loa, MD;  Location: [facility];  Service: Orthopedics;  Laterality: Right;    Family History  Problem Relation Age of Onset  . Arthritis Maternal Grandmother   . Hypertension Father   . Hypertension Mother   . Diabetes Father   . Heart disease Mother   . COPD Mother   . Cancer Paternal Grandmother     spine cancer  . Colon polyps Father  Allergies  Allergen Reactions  . Shellfish Allergy Shortness Of Breath and Swelling    Current Outpatient Prescriptions on File Prior to Visit  Medication Sig Dispense Refill  . glucose blood (ONETOUCH VERIO) test strip Test twice daily 100 each 5  . lisinopril-hydrochlorothiazide (PRINZIDE,ZESTORETIC) 10-12.5 MG per tablet Take 1 tablet by mouth daily. 90 tablet 3  . ONETOUCH DELICA LANCETS FINE MISC Test twice daily 100 each 5   No current facility-administered medications on file prior to visit.    BP 122/90 mmHg  Temp(Src) 98.8 F (37.1 C) (Oral)  Ht  (1.803 m)  Wt 261 lb 8 oz (118.616 kg)  BMI 36.49 kg/m2       Objective:   Physical Exam  Constitutional: He is oriented to person, place, and time. He appears well-developed and well-nourished. No distress.  Cardiovascular: Normal rate, regular rhythm, normal heart  sounds and intact distal pulses.  Exam reveals no gallop and no friction rub.   No murmur heard. Pulmonary/Chest: Effort normal and breath sounds normal. No respiratory distress. He has no wheezes. He has no rales. He exhibits no tenderness.  Abdominal: Soft. Bowel sounds are normal. He exhibits no distension and no mass. There is no tenderness. There is no rebound and no guarding.  obese  Neurological: He is alert and oriented to person, place, and time.  Skin: Skin is warm and dry. No rash noted. He is not diaphoretic. No erythema. No pallor.  Psychiatric: He has a normal mood and affect. His behavior is normal. Thought content normal.  Vitals reviewed.      Assessment & Plan:  1. Medication refill - omeprazole (PRILOSEC) 40 MG capsule; Take 1 capsule (40 mg total) by mouth daily.  Dispense: 90 capsule; Refill: 3 - phentermine 30 MG capsule; Take 1 capsule (30 mg total) by mouth every morning.  Dispense: 30 capsule; Refill: 0 - buPROPion (WELLBUTRIN XL) 300 MG 24 hr tablet; Take 1 tablet (300 mg total) by mouth daily.  Dispense: 90 tablet; Refill: 2  2. Obesity - phentermine 30 MG capsule; Take 1 capsule (30 mg total) by mouth every morning.  Dispense: 30 capsule; Refill: 0 - continue to work on diet and exercise - Start exercising  3. Uncontrolled type 2 diabetes mellitus without complication, without long-term current use of insulin (HCC) - Hemoglobin A1c - Basic metabolic panel - Continue to monitor blood sugars are home - Consider medication 4. Tobacco use disorder - buPROPion (WELLBUTRIN XL) 300 MG 24 hr tablet; Take 1 tablet (300 mg total) by mouth daily.  Dispense: 90 tablet; Refill: 2 - Quit smoking 5. Depression - Increased wellbutrin - buPROPion (WELLBUTRIN XL) 300 MG 24 hr tablet; Take 1 tablet (300 mg total) by mouth daily.  Dispense: 90 tablet; Refill: 2

## 2015-02-21 ENCOUNTER — Other Ambulatory Visit: Payer: Self-pay | Admitting: Adult Health

## 2015-02-21 DIAGNOSIS — F329 Major depressive disorder, single episode, unspecified: Secondary | ICD-10-CM

## 2015-02-21 DIAGNOSIS — F17200 Nicotine dependence, unspecified, uncomplicated: Secondary | ICD-10-CM

## 2015-02-21 DIAGNOSIS — F32A Depression, unspecified: Secondary | ICD-10-CM

## 2015-02-21 DIAGNOSIS — Z760 Encounter for issue of repeat prescription: Secondary | ICD-10-CM

## 2015-02-21 MED ORDER — OMEPRAZOLE 40 MG PO CPDR: 40.0000 mg | DELAYED_RELEASE_CAPSULE | Freq: Every day | ORAL | Status: DC

## 2015-02-21 MED ORDER — BUPROPION HCL ER (XL) 300 MG PO TB24: 300.0000 mg | ORAL_TABLET | Freq: Every day | ORAL | Status: DC

## 2015-03-01 ENCOUNTER — Other Ambulatory Visit: Payer: Self-pay | Admitting: Adult Health

## 2015-03-01 DIAGNOSIS — E669 Obesity, unspecified: Secondary | ICD-10-CM

## 2015-03-01 DIAGNOSIS — Z760 Encounter for issue of repeat prescription: Secondary | ICD-10-CM

## 2015-03-02 MED ORDER — PHENTERMINE HCL 30 MG PO CAPS: 30.0000 mg | ORAL_CAPSULE | ORAL | Status: DC

## 2015-03-02 NOTE — Addendum Note (Signed)
Addended by: Azucena Freed on: 03/02/2015 01:28 PM   Modules accepted: Orders

## 2015-03-02 NOTE — Telephone Encounter (Signed)
Rx printed and faxed to [facility]. 

## 2015-03-14 ENCOUNTER — Other Ambulatory Visit: Payer: Self-pay | Admitting: *Deleted

## 2015-03-14 DIAGNOSIS — I10 Essential (primary) hypertension: Secondary | ICD-10-CM

## 2015-03-14 MED ORDER — LISINOPRIL-HYDROCHLOROTHIAZIDE 10-12.5 MG PO TABS: 1.0000 | ORAL_TABLET | Freq: Every day | ORAL | Status: DC

## 2015-04-14 ENCOUNTER — Other Ambulatory Visit: Payer: Self-pay | Admitting: Adult Health

## 2015-04-14 NOTE — Telephone Encounter (Signed)
I need to know patient's current weight before [facility] Left message for patient to return phone call.

## 2015-04-17 NOTE — Telephone Encounter (Signed)
Contacted patient. He states his current weight is 247. Ok to refill this medication?

## 2015-04-17 NOTE — Telephone Encounter (Signed)
Ok to refill for one month  

## 2015-04-24 ENCOUNTER — Ambulatory Visit: Payer: Self-pay | Admitting: Family Medicine

## 2015-04-25 ENCOUNTER — Ambulatory Visit: Payer: 59 | Admitting: Adult Health

## 2015-04-26 ENCOUNTER — Encounter: Payer: Self-pay | Admitting: Adult Health

## 2015-04-26 ENCOUNTER — Ambulatory Visit (INDEPENDENT_AMBULATORY_CARE_PROVIDER_SITE_OTHER): Payer: 59 | Admitting: Adult Health

## 2015-04-26 VITALS — BP 120/90 | Temp 98.1°F | Wt 257.8 lb

## 2015-04-26 DIAGNOSIS — F329 Major depressive disorder, single episode, unspecified: Secondary | ICD-10-CM

## 2015-04-26 DIAGNOSIS — Z760 Encounter for issue of repeat prescription: Secondary | ICD-10-CM | POA: Diagnosis not present

## 2015-04-26 DIAGNOSIS — F32A Depression, unspecified: Secondary | ICD-10-CM

## 2015-04-26 MED ORDER — CITALOPRAM HYDROBROMIDE 20 MG PO TABS: 20.0000 mg | ORAL_TABLET | Freq: Every day | ORAL | Status: DC

## 2015-04-26 MED ORDER — PHENTERMINE HCL 30 MG PO CAPS: 30.0000 mg | ORAL_CAPSULE | Freq: Every morning | ORAL | Status: DC

## 2015-04-26 NOTE — Progress Notes (Signed)
   Subjective:    Patient ID: Joseph Barry, male    DOB: Apr 22, 1980, 35 y.o.   MRN: [id number]  HPI   35 year old male who  has a past medical history of GERD (gastroesophageal reflux disease); Migraines; Hypertension; History of cardiac murmur as a child; Sleep apnea; Eczema; History of MRSA infection (~ 2007); Finger laceration involving tendon (05/20/2014); and Diabetes mellitus type II, uncontrolled (HCC).  Presents to the office today to discuss medications, specifically, that of Wellbutrin. He has been taking Wellbutrin for approx 6 months. He feels as though this is no longer helping him. " I just dont feel like it is helping me as much as it once has." He continues " I feel like my life is getting worse and I have no support."   He denies SI or self harm but states " I sometimes wonder if it would be better if I wasn't here."   He denies any trouble at home. Most of his stress comes from work and not feeling appreciated.   He was taking his Wellbutrin every day but stopped taking it 3 days ago.     Review of Systems  Constitutional: Negative.   Neurological: Negative.   Psychiatric/Behavioral: Positive for decreased concentration. Negative for suicidal ideas and self-injury.       Depressed  All other systems reviewed and are negative.      Objective:   Physical Exam  Constitutional: He is oriented to person, place, and time. He appears well-developed and well-nourished. No distress.  Pulmonary/Chest: Effort normal and breath sounds normal.  Neurological: He is alert and oriented to person, place, and time.  Skin: Skin is warm and dry. No rash noted. He is not diaphoretic. No erythema. No pallor.  Psychiatric: He has a normal mood and affect. His behavior is normal. Judgment and thought content normal.  Nursing note and vitals reviewed.     Assessment & Plan:  1. Depression - D/c Wellbutrin - citalopram (CELEXA) 20 MG tablet; Take 1 tablet (20 mg total) by mouth  daily.  Dispense: 30 tablet; Refill: 3 - Stop taking medication and go to the ER with any thoughts of SI - Follow up in 3 weeks or sooner if needed 2. Medication refill  - phentermine 30 MG capsule; Take 1 capsule (30 mg total) by mouth every morning.  Dispense: 30 capsule; Refill: 0  Shirline Freesory Rhys Lichty, NP

## 2015-04-26 NOTE — Patient Instructions (Signed)
  It was great seeing you again!  Start taking the Celexa and stop the Wellbutrin.   Get back in the gym   Call Bubba Camp[facility]ie Albert [facility]   Address: [address], [place], [place] [address]  [phone]   Follow up with me in 3 weeks.

## 2015-05-17 ENCOUNTER — Ambulatory Visit (INDEPENDENT_AMBULATORY_CARE_PROVIDER_SITE_OTHER): Payer: 59 | Admitting: Adult Health

## 2015-05-17 ENCOUNTER — Encounter: Payer: Self-pay | Admitting: Adult Health

## 2015-05-17 VITALS — BP 100/78 | Temp 98.3°F | Wt 241.0 lb

## 2015-05-17 DIAGNOSIS — E1165 Type 2 diabetes mellitus with hyperglycemia: Secondary | ICD-10-CM | POA: Diagnosis not present

## 2015-05-17 DIAGNOSIS — Z760 Encounter for issue of repeat prescription: Secondary | ICD-10-CM

## 2015-05-17 DIAGNOSIS — F32A Depression, unspecified: Secondary | ICD-10-CM

## 2015-05-17 DIAGNOSIS — IMO0001 Reserved for inherently not codable concepts without codable children: Secondary | ICD-10-CM

## 2015-05-17 DIAGNOSIS — I10 Essential (primary) hypertension: Secondary | ICD-10-CM | POA: Diagnosis not present

## 2015-05-17 DIAGNOSIS — F329 Major depressive disorder, single episode, unspecified: Secondary | ICD-10-CM

## 2015-05-17 DIAGNOSIS — R634 Abnormal weight loss: Secondary | ICD-10-CM

## 2015-05-17 LAB — POCT GLYCOSYLATED HEMOGLOBIN (HGB A1C): Hemoglobin A1C: 6.4

## 2015-05-17 MED ORDER — PHENTERMINE HCL 30 MG PO CAPS: 30.0000 mg | ORAL_CAPSULE | Freq: Every morning | ORAL | Status: DC

## 2015-05-17 NOTE — Patient Instructions (Signed)
It was great seeing you today!  Stop taking Celexa and follow up with a psychiatrist . They will be able to better manage you.   Great job on the weight loss! Your A1c has dropped from 6.8 to 6.4!  Your blood pressure is a little low, cut back to 1/2 pill of Zestoretic.

## 2015-05-17 NOTE — Progress Notes (Signed)
Subjective:    Patient ID: Joseph Barry, male    DOB: [birth year], 35 y.o.   MRN: [id number]  HPI   35 year old male who presents to the office for one month follow up regarding depression and weight loss.   I last saw him approx 3 weeks ago, at that time we switched him from Wellbutrin to Celexa 20 mg. He reports that he does not like the side effects of celexa. His side effects include that of nausea, feeling of a lump in his throat and sexual side effects. He does report that his " mind is better". His wife who is with him at this visit endorses that " he is more even tempered now."   He is open to seeing a psychiatrist.   He has lost 16 pounds since the last visit. He reports that he is using the treadmill and is eating healthy. He has noticed his weight loss and feels good about it.   Wt Readings from Last 3 Encounters:  05/17/15 241 lb (109.317 kg)  04/26/15 257 lb 12.8 oz (116.937 kg)  01/27/15 261 lb 8 oz (118.616 kg)   His blood pressure today in the office is 100/78. He has been well controlled on Zestoretic 10-12.5 mg. With his weight loss his blood pressure has been coming down.   BP Readings from Last 3 Encounters:  05/17/15 100/78  04/26/15 120/90  01/27/15 122/90     Review of Systems  Constitutional: Negative.   Respiratory: Negative.   Cardiovascular: Negative.   Musculoskeletal: Negative.   Neurological: Negative.   Psychiatric/Behavioral: Negative for suicidal ideas and self-injury.       Depression   All other systems reviewed and are negative.  Past Medical History  Diagnosis Date  . GERD (gastroesophageal reflux disease)   . Migraines   . Hypertension     states under control with med., has been on med. x 1 yr.  . History of cardiac murmur as a child   . Sleep apnea     uses CPAP nightly  . Eczema   . History of MRSA infection ~ 2007    back  . Finger laceration involving tendon 05/20/2014    right index, long, ring, small fingers -  tendon/artery/nerve lac.  . Diabetes mellitus type II, uncontrolled (HCC)     Social History   Social History  . Marital Status: Married    Spouse Name: N/A  . Number of Children: N/A  . Years of Education: N/A   Occupational History  . Not on file.   Social History Main Topics  . Smoking status: Current Every Day Smoker -- 5 years    Types: Cigars  . Smokeless tobacco: Never Used     Comment: 5 cigars/day  . Alcohol Use: 0.0 oz/week    0 Standard drinks or equivalent per week     Comment: occasionally  . Drug Use: No  . Sexual Activity: Not on file   Other Topics Concern  . 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    Married for five years   Has step daughter.    Has rabbit and dog.        Past Surgical History  Procedure Laterality Date  . Orbital fracture surgery Left 2003  . Nerve, tendon and artery repair Right 05/24/2014    Procedure: RIGHT INDEX LONG RING SMALL/REPAIR TENDON, ARTERY NERVE;  Surgeon: Betha LoaKevin Kuzma, MD;  Location: [facility];  Service: Orthopedics;  Laterality: Right;    Family History  Problem Relation Age of Onset  . Arthritis Maternal Grandmother   . Hypertension Father   . Hypertension Mother   . Diabetes Father   . Heart disease Mother   . COPD Mother   . Cancer Paternal Grandmother     spine cancer  . Colon polyps Father     Allergies  Allergen Reactions  . Shellfish Allergy Shortness Of Breath and Swelling    Current Outpatient Prescriptions on File Prior to Visit  Medication Sig Dispense Refill  . citalopram (CELEXA) 20 MG tablet Take 1 tablet (20 mg total) by mouth daily. 30 tablet 3  . glucose blood (ONETOUCH VERIO) test strip Test twice daily 100 each 5  . lisinopril-hydrochlorothiazide (PRINZIDE,ZESTORETIC) 10-12.5 MG tablet Take 1 tablet by mouth daily. 90 tablet 3  . omeprazole (PRILOSEC) 40 MG capsule Take 1 capsule (40 mg total) by mouth  daily. 90 capsule 3  . ONETOUCH DELICA LANCETS FINE MISC Test twice daily 100 each 5   No current facility-administered medications on file prior to visit.    BP 100/78 mmHg  Temp(Src) 98.3 F (36.8 C) (Oral)  Wt 241 lb (109.317 kg)       Objective:   Physical Exam  Constitutional: He is oriented to person, place, and time. He appears well-developed and well-nourished. No distress.  Cardiovascular: Normal rate, regular rhythm, normal heart sounds and intact distal pulses.  Exam reveals no gallop and no friction rub.   No murmur heard. Pulmonary/Chest: Effort normal and breath sounds normal. No respiratory distress. He has no wheezes. He has no rales. He exhibits no tenderness.  Neurological: He is alert and oriented to person, place, and time.  Skin: Skin is warm and dry. No rash noted. He is not diaphoretic. No erythema. No pallor.  Psychiatric: He has a normal mood and affect. His behavior is normal. Judgment and thought content normal.  Nursing note and vitals reviewed.      Assessment & Plan:  1. Depression - Stop Celexa - I would like him to follow up with psych. List of local psychiatrists given  - of course, go to the ER with any thoughts of SI or self harm   2. Loss of weight - Continue to exercise and eat healthy  - phentermine 30 MG capsule; Take 1 capsule (30 mg total) by mouth every morning.  Dispense: 30 capsule; Refill: 0  3. Essential hypertension - Cut back to 1/2 tab of Zestoretic - Consider d/cing   4. Uncontrolled type 2 diabetes mellitus without complication, without long-term current use of insulin (HCC) - POC HgB A1c - Dropped from 6.8 to 6.4  - Is diet controlled   Shirline Frees, NP

## 2015-06-21 ENCOUNTER — Telehealth: Payer: Self-pay | Admitting: Adult Health

## 2015-06-21 ENCOUNTER — Ambulatory Visit (INDEPENDENT_AMBULATORY_CARE_PROVIDER_SITE_OTHER): Payer: 59 | Admitting: Internal Medicine

## 2015-06-21 VITALS — BP 122/86 | HR 72 | Temp 98.3°F | Resp 18 | Ht 71.0 in | Wt 247.6 lb

## 2015-06-21 DIAGNOSIS — H8110 Benign paroxysmal vertigo, unspecified ear: Secondary | ICD-10-CM

## 2015-06-21 DIAGNOSIS — I10 Essential (primary) hypertension: Secondary | ICD-10-CM

## 2015-06-21 DIAGNOSIS — R519 Headache, unspecified: Secondary | ICD-10-CM

## 2015-06-21 DIAGNOSIS — R42 Dizziness and giddiness: Secondary | ICD-10-CM | POA: Diagnosis not present

## 2015-06-21 DIAGNOSIS — E1165 Type 2 diabetes mellitus with hyperglycemia: Secondary | ICD-10-CM

## 2015-06-21 DIAGNOSIS — IMO0001 Reserved for inherently not codable concepts without codable children: Secondary | ICD-10-CM

## 2015-06-21 DIAGNOSIS — R51 Headache: Secondary | ICD-10-CM

## 2015-06-21 LAB — POCT CBC
Granulocyte percent: 66.1 %G (ref 37–80)
HEMATOCRIT: 43.5 % (ref 43.5–53.7)
Hemoglobin: 15.5 g/dL (ref 14.1–18.1)
LYMPH, POC: 2.8 (ref 0.6–3.4)
MCH, POC: 31.3 pg — AB (ref 27–31.2)
MCHC: 35.7 g/dL — AB (ref 31.8–35.4)
MCV: 87.6 fL (ref 80–97)
MID (CBC): 0.4 (ref 0–0.9)
MPV: 6.2 fL (ref 0–99.8)
POC GRANULOCYTE: 6.3 (ref 2–6.9)
POC LYMPH %: 29.3 % (ref 10–50)
POC MID %: 4.6 % (ref 0–12)
Platelet Count, POC: 420 10*3/uL (ref 142–424)
RBC: 4.96 M/uL (ref 4.69–6.13)
RDW, POC: 13.4 %
WBC: 9.6 10*3/uL (ref 4.6–10.2)

## 2015-06-21 LAB — GLUCOSE, POCT (MANUAL RESULT ENTRY): POC Glucose: 81 mg/dl (ref 70–99)

## 2015-06-21 MED ORDER — DICLOFENAC SODIUM 75 MG PO TBEC: 75.0000 mg | DELAYED_RELEASE_TABLET | Freq: Two times a day (BID) | ORAL | Status: DC

## 2015-06-21 MED ORDER — MECLIZINE HCL 25 MG PO TABS: 25.0000 mg | ORAL_TABLET | Freq: Three times a day (TID) | ORAL | Status: DC | PRN

## 2015-06-21 NOTE — Patient Instructions (Signed)
     IF you received an x-ray today, you will receive an invoice from [facility]. Please contact [facility] at [phone] with questions or concerns regarding your invoice.   IF you received labwork today, you will receive an invoice from [facility]. Please contact [facility] at [phone] with questions or concerns regarding your invoice.   Our billing staff will not be able to assist you with questions regarding bills from these companies.  You will be contacted with the lab results as soon as they are available. The fastest way to get your results is to activate your My Chart account. Instructions are located on the last page of this paperwork. If you have not heard from us regarding the results in 2 weeks, please contact this office.      

## 2015-06-21 NOTE — Telephone Encounter (Signed)
Spoke with patient - he [facility].

## 2015-06-21 NOTE — Progress Notes (Signed)
Subjective:  By signing my name below, I, Raven Small, attest that this documentation has been prepared under the direction and in the presence of Ellamae Siaobert Louie Meaders, MD.  Electronically Signed: Andrew Auaven Small, ED Scribe. 06/21/2015. 4:26 PM.   Patient ID: Joseph Barry, male    DOB: 1980-07-08, 35 y.o.   MRN: [id number]  HPI Chief Complaint  Patient presents with  . Dizziness    unsteady on feet since Monday  . Headache    since Monday    HPI Comments: Joseph Barry is a 35 y.o. male who presents to the [facility] complaining of dizziness that began 2 days ago. Pt felt unsteady and foggy while at work 2 days ago. Shortly after unsteadiness slight improved but lingered with associated left sided HA and occas nausea. Pt reports experiencing these symptoms first thing in the morning for the past 2 days. His wife notes allergy symptoms of sneezing and and decreased hearing, as if ears are clogged.No vision changes. No vomiting. No muscle weakness,confusion,loss of appetite.   Pt admits to indulging in sweets this past week but states sugars have been in normal range.  BP home reading this morning was 117/80.    He has hx of migraines which improved after he got glasses.    He denies cough, SOB, leg swelling, trouble sleeping.    Patient Active Problem List   Diagnosis Date Noted  . Obesity 10/28/2014  . Depression 09/13/2014  . Tobacco use disorder 06/13/2014  . Flexor tendon laceration, finger, open wound 05/24/2014  . Essential hypertension 01/17/2014  . Diabetes type 2, uncontrolled (HCC) 01/17/2014    OSA  Past Medical History  Diagnosis Date  . GERD (gastroesophageal reflux disease)   . Migraines   . Hypertension     states under control with med., has been on med. x 1 yr.  . History of cardiac murmur as a child   . Sleep apnea     uses CPAP nightly  . Eczema   . History of MRSA infection ~ 2007    back  . Finger laceration involving tendon  05/20/2014    right index, long, ring, small fingers - tendon/artery/nerve lac.  . Diabetes mellitus type II, uncontrolled (HCC)   . Depression    Past Surgical History  Procedure Laterality Date  . Orbital fracture surgery Left 2003  . Nerve, tendon and artery repair Right 05/24/2014    Procedure: RIGHT INDEX LONG RING SMALL/REPAIR TENDON, ARTERY NERVE;  Surgeon: Betha LoaKevin Kuzma, MD;  Location: [facility];  Service: Orthopedics;  Laterality: Right;   Allergies  Allergen Reactions  . Shellfish Allergy Shortness Of Breath and Swelling   Prior to Admission medications   Medication Sig Start Date End Date Taking? Authorizing Provider  glucose blood (ONETOUCH VERIO) test strip Test twice daily 10/28/14  Yes Shirline Freesory Nafziger, NP  lisinopril-hydrochlorothiazide (PRINZIDE,ZESTORETIC) 10-12.5 MG tablet Take 1 tablet by mouth daily. 03/14/15  Yes Shirline Freesory Nafziger, NP  omeprazole (PRILOSEC) 40 MG capsule Take 1 capsule (40 mg total) by mouth daily. 02/21/15  Yes Shirline Freesory Nafziger, NP  ONETOUCH DELICA LANCETS FINE MISC Test twice daily 10/28/14  Yes Shirline Freesory Nafziger, NP  phentermine 30 MG capsule Take 1 capsule (30 mg total) by mouth every morning. 05/17/15  Yes Shirline Freesory Nafziger, NP  citalopram (CELEXA) 20 MG tablet Take 1 tablet (20 mg total) by mouth daily. Patient not taking: Reported on 06/21/2015 04/26/15   Shirline Freesory Nafziger, NP   Social History  Social History  . Marital Status: Married    Spouse Name: N/A  . Number of Children: N/A  . Years of Education: N/A   Occupational History  . Not on file.   Social History Main Topics  . Smoking status: Current Every Day Smoker -- 5 years    Types: Cigars  . Smokeless tobacco: Never Used     Comment: 5 cigars/day  . Alcohol Use: 0.0 oz/week    0 Standard drinks or equivalent per week     Comment: occasionally  . Drug Use: No  . Sexual Activity: Not on file   Other Topics Concern  . 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    Married for five years   Has step daughter.    Has rabbit and dog.       Review of Systems  Respiratory: Negative for cough and shortness of breath.   Cardiovascular: Negative for leg swelling.  Gastrointestinal: Positive for nausea. Negative for vomiting.  Neurological: Positive for dizziness and headaches.  Psychiatric/Behavioral: Negative for sleep disturbance.       Objective:   Physical Exam  Constitutional: He is oriented to person, place, and time. He appears well-developed and well-nourished. No distress.  HENT:  Head: Normocephalic and atraumatic.  Right Ear: External ear normal.  Left Ear: External ear normal.  Nose: Nose normal.  Mouth/Throat: Oropharynx is clear and moist.  Eyes: Conjunctivae and EOM are normal. Pupils are equal, round, and reactive to light.  Neck: Normal range of motion. Neck supple. No thyromegaly present.  Cardiovascular: Normal rate, regular rhythm, normal heart sounds and intact distal pulses.   No murmur heard. Pulmonary/Chest: Effort normal and breath sounds normal.  Abdominal: Soft. Bowel sounds are normal. There is no tenderness.  Musculoskeletal: Normal range of motion. He exhibits no edema.  Lymphadenopathy:    He has no cervical adenopathy.  Neurological: He is alert and oriented to person, place, and time. He has normal reflexes. He displays normal reflexes. No cranial nerve deficit. Coordination normal.  Skin: Skin is warm and dry.  Psychiatric: He has a normal mood and affect. His behavior is normal.  Nursing note and vitals reviewed.   Filed Vitals:   06/21/15 1553  BP: 122/86  Pulse: 72  Temp: 98.3 F (36.8 C)  TempSrc: Oral  Resp: 18  Height:  (1.803 m)  Weight: 247 lb 9.6 oz (112.311 kg)  SpO2: 100%   Results for orders placed or performed in visit on 06/21/15  POCT CBC  Result Value Ref Range   WBC 9.6 4.6 - 10.2 K/uL   Lymph, poc 2.8 0.6 - 3.4   POC LYMPH  PERCENT 29.3 10 - 50 %L   MID (cbc) 0.4 0 - 0.9   POC MID % 4.6 0 - 12 %M   POC Granulocyte 6.3 2 - 6.9   Granulocyte percent 66.1 37 - 80 %G   RBC 4.96 4.69 - 6.13 M/uL   Hemoglobin 15.5 14.1 - 18.1 g/dL   HCT, POC 95.6 21.3 - 53.7 %   MCV 87.6 80 - 97 fL   MCH, POC 31.3 (A) 27 - 31.2 pg   MCHC 35.7 (A) 31.8 - 35.4 g/dL   RDW, POC 08.6 %   Platelet Count, POC 420 142 - 424 K/uL   MPV 6.2 0 - 99.8 fL  POCT glucose (manual entry)  Result Value Ref Range  POC Glucose 81 70 - 99 mg/dl    Assessment & Plan:  Dizzy - Plan: POCT CBC  BPV (benign positional vertigo), unspecified laterality  Nonintractable episodic headache, unspecified headache type  Essential hypertension - Plan: Comprehensive metabolic panel  Uncontrolled type 2 diabetes mellitus without complication, without long-term current use of insulin (HCC) - Plan: POCT glucose (manual entry)  Meds ordered this encounter  Medications  . diclofenac (VOLTAREN) 75 MG EC tablet    Sig: Take 1 tablet (75 mg total) by mouth 2 (two) times daily. As needed for headache    Dispense:  20 tablet    Refill:  0  . meclizine (ANTIVERT) 25 MG tablet    Sig: Take 1 tablet (25 mg total) by mouth 3 (three) times daily as needed for dizziness.    Dispense:  30 tablet    Refill:  0  Close f/u---will need reexam if worse or this doesn't clear OOW til stable  I have completed the patient encounter in its entirety as documented by the scribe, with editing by me where necessary. Esperansa Sarabia P. Merla Riches, M.D.

## 2015-06-21 NOTE — Telephone Encounter (Signed)
Patient Name: Pryor MontesMICHAEL Weyman  DOB: 10-Mar-1980    Initial Comment BP low? headache, lightheaded, unsteady, nausea    Nurse Assessment  Nurse: Annye Englisharmon, RN, Denise Date/Time (Eastern Time): 06/21/2015 1:37:10 PM  Confirm and document reason for call. If symptomatic, describe symptoms. You must click the next button to save text entered. ---BP possibly low but unsure of reading w/ headache, lightheaded, unsteady, nausea. Started 2 days ago.  Has the patient traveled out of the country within the last 30 days? ---Not Applicable  Does the patient have any new or worsening symptoms? ---Yes  Will a triage be completed? ---Yes  Related visit to physician within the last 2 weeks? ---No  Does the PT have any chronic conditions? (i.e. diabetes, asthma, etc.) ---Yes  List chronic conditions. ---HTN  Is this a behavioral health or substance abuse call? ---No     Guidelines    Guideline Title Affirmed Question Affirmed Notes  Dizziness - Lightheadedness [1] MODERATE dizziness (e.g., interferes with normal activities) AND [2] has NOT been evaluated by physician for this (Exception: dizziness caused by heat exposure, sudden standing, or poor fluid intake)    Final Disposition User   See Physician within 4 Hours (or PCP triage) Annye Englisharmon, RN, Angelique Blonderenise    Comments  Attempted to make appt, but no appts avail today. [facility]   Referrals  [facility]   Disagree/Comply: Comply

## 2015-06-22 LAB — COMPREHENSIVE METABOLIC PANEL
ALBUMIN: 4.1 g/dL (ref 3.6–5.1)
ALT: 17 U/L (ref 9–46)
AST: 18 U/L (ref 10–40)
Alkaline Phosphatase: 61 U/L (ref 40–115)
BUN: 13 mg/dL (ref 7–25)
CHLORIDE: 102 mmol/L (ref 98–110)
CO2: 19 mmol/L — AB (ref 20–31)
CREATININE: 1.09 mg/dL (ref 0.60–1.35)
Calcium: 9.3 mg/dL (ref 8.6–10.3)
Glucose, Bld: 90 mg/dL (ref 65–99)
POTASSIUM: 4.4 mmol/L (ref 3.5–5.3)
SODIUM: 137 mmol/L (ref 135–146)
Total Bilirubin: 0.3 mg/dL (ref 0.2–1.2)
Total Protein: 7.2 g/dL (ref 6.1–8.1)

## 2015-07-05 ENCOUNTER — Other Ambulatory Visit: Payer: Self-pay | Admitting: Adult Health

## 2015-07-06 ENCOUNTER — Other Ambulatory Visit: Payer: Self-pay | Admitting: Adult Health

## 2015-07-06 DIAGNOSIS — R634 Abnormal weight loss: Secondary | ICD-10-CM

## 2015-07-06 MED ORDER — PHENTERMINE HCL 30 MG PO CAPS: 30.0000 mg | ORAL_CAPSULE | Freq: Every morning | ORAL | Status: DC

## 2015-07-12 ENCOUNTER — Ambulatory Visit (INDEPENDENT_AMBULATORY_CARE_PROVIDER_SITE_OTHER): Payer: 59 | Admitting: Adult Health

## 2015-07-12 ENCOUNTER — Encounter: Payer: Self-pay | Admitting: Adult Health

## 2015-07-12 VITALS — BP 128/84 | Temp 97.9°F | Wt 260.0 lb

## 2015-07-12 DIAGNOSIS — E669 Obesity, unspecified: Secondary | ICD-10-CM

## 2015-07-12 DIAGNOSIS — G44209 Tension-type headache, unspecified, not intractable: Secondary | ICD-10-CM | POA: Diagnosis not present

## 2015-07-12 DIAGNOSIS — F329 Major depressive disorder, single episode, unspecified: Secondary | ICD-10-CM

## 2015-07-12 DIAGNOSIS — R197 Diarrhea, unspecified: Secondary | ICD-10-CM

## 2015-07-12 DIAGNOSIS — IMO0001 Reserved for inherently not codable concepts without codable children: Secondary | ICD-10-CM

## 2015-07-12 DIAGNOSIS — L309 Dermatitis, unspecified: Secondary | ICD-10-CM

## 2015-07-12 DIAGNOSIS — E1165 Type 2 diabetes mellitus with hyperglycemia: Secondary | ICD-10-CM | POA: Diagnosis not present

## 2015-07-12 DIAGNOSIS — F32A Depression, unspecified: Secondary | ICD-10-CM

## 2015-07-12 LAB — POCT GLYCOSYLATED HEMOGLOBIN (HGB A1C): HEMOGLOBIN A1C: 6.3

## 2015-07-12 MED ORDER — TIZANIDINE HCL 4 MG PO TABS: 4.0000 mg | ORAL_TABLET | Freq: Four times a day (QID) | ORAL | Status: DC | PRN

## 2015-07-12 MED ORDER — FLUOCINOLONE ACETONIDE 0.025 % EX CREA: TOPICAL_CREAM | Freq: Two times a day (BID) | CUTANEOUS | Status: DC

## 2015-07-12 NOTE — Patient Instructions (Addendum)
It was great seeing you again!  I have sent in a prescription for Zanaflex - this is a muscle relaxer. Use it at night first to see how it makes you feel.    I have also sent in a prescription for Synalar cream - use this twice a day for eczema. I would also advise in using oral Vitamin D and Vitamin E  Your A1c today is 6.3. Really work on diet and exercise. Follow up with me in 3 months.

## 2015-07-12 NOTE — Progress Notes (Signed)
Subjective:    Patient ID: Joseph MontesMichael Drouillard, male    DOB: 08-25-1980, 35 y.o.   MRN: [id number]  HPI 35 year old male who presents to the office today with multiple complaints.   1. Depression - He is no longer taking Celexa as he feels as though it was not helping and he did not like the side effects of sexual dysfunction. Marland Kitchen. He is seeing a therapist but does not feel like it is helping either. He feels as though his depression is making him eat more junk food and makes him not want to exercise.   2. Weight gain.  - He is gaining weight due to not unhealthy eating and not exercising. He is not using Phentermine longer.  Wt Readings from Last 3 Encounters:  07/12/15 260 lb (117.935 kg)  06/21/15 247 lb 9.6 oz (112.311 kg)  05/17/15 241 lb (109.317 kg)   3. He was seen at [facility]gent [facility] last month for dizziness, feeling unsteady and "foggy", with a headache. Today he reports that he continues to have a slight headache but that that dizziness has resolved. Currently he has had 3 days of nausea ,diarrhea and abdominal pain. Over the past 24 hours the diarrhea has started to resolve. He was taking multiple BC powder treatment as well as Voltaren tablets as directed for his headache.   4. Eczema - He has been having an flare up of eczema on bilateral hands.   Review of Systems  Constitutional: Positive for activity change and appetite change. Negative for fatigue.  Gastrointestinal: Positive for nausea, abdominal pain and diarrhea. Negative for vomiting, constipation, blood in stool and rectal pain.  Musculoskeletal: Positive for neck pain and neck stiffness. Negative for back pain, joint swelling and gait problem.  Skin: Positive for rash (eczema on hands).  Neurological: Positive for headaches. Negative for dizziness and light-headedness.  Psychiatric/Behavioral: Negative for suicidal ideas, sleep disturbance and self-injury.       Depression  All other systems reviewed and are  negative.  Past Medical History  Diagnosis Date  . GERD (gastroesophageal reflux disease)   . Migraines   . Hypertension     states under control with med., has been on med. x 1 yr.  . History of cardiac murmur as a child   . Sleep apnea     uses CPAP nightly  . Eczema   . History of MRSA infection ~ 2007    back  . Finger laceration involving tendon 05/20/2014    right index, long, ring, small fingers - tendon/artery/nerve lac.  . Diabetes mellitus type II, uncontrolled (HCC)   . Depression     Social History   Social History  . Marital Status: Married    Spouse Name: N/A  . Number of Children: N/A  . Years of Education: N/A   Occupational History  . Not on file.   Social History Main Topics  . Smoking status: Current Every Day Smoker -- 5 years    Types: Cigars  . Smokeless tobacco: Never Used     Comment: 5 cigars/day  . Alcohol Use: 0.0 oz/week    0 Standard drinks or equivalent per week     Comment: occasionally  . Drug Use: No  . Sexual Activity: Not on file   Other Topics Concern  . 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    Married for five years   Has step daughter.  Has rabbit and dog.        Past Surgical History  Procedure Laterality Date  . Orbital fracture surgery Left 2003  . Nerve, tendon and artery repair Right 05/24/2014    Procedure: RIGHT INDEX LONG RING SMALL/REPAIR TENDON, ARTERY NERVE;  Surgeon: Betha LoaKevin Kuzma, MD;  Location: [facility];  Service: Orthopedics;  Laterality: Right;    Family History  Problem Relation Age of Onset  . Arthritis Maternal Grandmother   . Diabetes Maternal Grandmother   . Heart disease Maternal Grandmother   . Hypertension Father   . Diabetes Father   . Colon polyps Father   . Hypertension Mother   . Heart disease Mother   . COPD Mother   . Cancer Paternal Grandmother     spine cancer  . Hypertension Sister      Allergies  Allergen Reactions  . Shellfish Allergy Shortness Of Breath and Swelling    Current Outpatient Prescriptions on File Prior to Visit  Medication Sig Dispense Refill  . citalopram (CELEXA) 20 MG tablet Take 1 tablet (20 mg total) by mouth daily. 30 tablet 3  . diclofenac (VOLTAREN) 75 MG EC tablet Take 1 tablet (75 mg total) by mouth 2 (two) times daily. As needed for headache 20 tablet 0  . glucose blood (ONETOUCH VERIO) test strip Test twice daily 100 each 5  . lisinopril-hydrochlorothiazide (PRINZIDE,ZESTORETIC) 10-12.5 MG tablet Take 1 tablet by mouth daily. 90 tablet 3  . meclizine (ANTIVERT) 25 MG tablet Take 1 tablet (25 mg total) by mouth 3 (three) times daily as needed for dizziness. 30 tablet 0  . omeprazole (PRILOSEC) 40 MG capsule Take 1 capsule (40 mg total) by mouth daily. 90 capsule 3  . ONETOUCH DELICA LANCETS FINE MISC Test twice daily 100 each 5  . phentermine 30 MG capsule Take 1 capsule (30 mg total) by mouth every morning. 30 capsule 0   No current facility-administered medications on file prior to visit.    BP 128/84 mmHg  Temp(Src) 97.9 F (36.6 C) (Oral)  Wt 260 lb (117.935 kg)       Objective:   Physical Exam  Constitutional: He is oriented to person, place, and time. He appears well-developed and well-nourished. No distress.  Flat affect obese  HENT:  Head: Normocephalic and atraumatic.  Right Ear: External ear normal.  Left Ear: External ear normal.  Nose: Nose normal.  Mouth/Throat: Oropharynx is clear and moist. No oropharyngeal exudate.  Eyes: Conjunctivae and EOM are normal. Pupils are equal, round, and reactive to light. Right eye exhibits no discharge. Left eye exhibits no discharge. No scleral icterus.  Neck: Normal range of motion. Neck supple.  Cardiovascular: Normal rate, regular rhythm, normal heart sounds and intact distal pulses.  Exam reveals no gallop.   No murmur heard. Pulmonary/Chest: Effort normal and breath sounds  normal. No respiratory distress. He has no wheezes. He has no rales. He exhibits no tenderness.  Abdominal: Soft. Bowel sounds are normal. He exhibits no distension and no mass. There is no tenderness. There is no rebound and no guarding.  Musculoskeletal: Normal range of motion. He exhibits no edema or tenderness.  Improved discomfort with gentle palpation to trapezius.   Neurological: He is alert and oriented to person, place, and time. He has normal reflexes. He displays normal reflexes. No cranial nerve deficit. He exhibits normal muscle tone. Coordination normal.  Skin: Skin is warm and dry. No rash noted. He is not diaphoretic. No erythema. No pallor.  Psychiatric: He has a normal mood and affect. His behavior is normal. Judgment and thought content normal.  Vitals reviewed.     Assessment & Plan:  1. Depression - he does not want to try any additional medications at this time but is open to seeing a psychiatrist.  - List of local psychiatrists given to the patient  2. Uncontrolled type 2 diabetes mellitus without complication, without long-term current use of insulin (HCC)  - POC HgB A1c 6.0   3. Obesity - Continue with phentermine - start walking and eatingn healthy   4. Tension headache - tiZANidine (ZANAFLEX) 4 MG tablet; Take 1 tablet (4 mg total) by mouth every 6 (six) hours as needed for muscle spasms.  Dispense: 30 tablet; Refill: 0 - Heating pad at night - Stretching exercises - Massage 5. Eczema - fluocinolone (SYNALAR) 0.025 % cream; Apply topically 2 (two) times daily.  Dispense: 15 g; Refill: 0 - Oral OTC vitamin d and E 6. Diarrhea, unspecified type - Likely due to diet and NSAID use - Take tylenol as needed instead of NSAIDS - Change diet  - Can take probiotic  - Follow up if no continued improvement   Shirline Frees, NP

## 2015-07-18 ENCOUNTER — Other Ambulatory Visit: Payer: Self-pay | Admitting: Adult Health

## 2015-07-18 DIAGNOSIS — R634 Abnormal weight loss: Secondary | ICD-10-CM

## 2015-07-18 MED ORDER — PHENTERMINE HCL 30 MG PO CAPS: 30.0000 mg | ORAL_CAPSULE | Freq: Every morning | ORAL | Status: DC

## 2015-08-14 ENCOUNTER — Other Ambulatory Visit: Payer: Self-pay | Admitting: Adult Health

## 2015-08-14 DIAGNOSIS — L309 Dermatitis, unspecified: Secondary | ICD-10-CM

## 2015-08-14 DIAGNOSIS — R634 Abnormal weight loss: Secondary | ICD-10-CM

## 2015-08-15 NOTE — Telephone Encounter (Signed)
Called and spoke with pt. Pt states when he weighed this morning he was 150lbs .

## 2015-08-15 NOTE — Telephone Encounter (Signed)
Ok to refill  Cream with 3 refills  Phentermine, 30 days with no refills. Can we see what his weight is?

## 2015-08-15 NOTE — Telephone Encounter (Signed)
Pt requesting refill.  On fluocinolone (SYNALAR) 0.025% 15g refills:0 Last filled 07/12/15   Phentermine 30mg  # 30 refills:0 last filled 07/18/15   Last seen in office 07/12/15   Okay to refill?

## 2015-08-15 NOTE — Telephone Encounter (Signed)
Error Pt stated he weighs 250lbs

## 2015-10-12 ENCOUNTER — Ambulatory Visit: Payer: 59 | Admitting: Adult Health

## 2015-10-12 DIAGNOSIS — Z0289 Encounter for other administrative examinations: Secondary | ICD-10-CM

## 2015-10-13 ENCOUNTER — Other Ambulatory Visit: Payer: Self-pay | Admitting: Adult Health

## 2016-05-01 ENCOUNTER — Ambulatory Visit (HOSPITAL_COMMUNITY)
Admission: EM | Admit: 2016-05-01 | Discharge: 2016-05-01 | Disposition: A | Discharge status: Home / Self Care | Payer: 59 | Attending: Internal Medicine | Admitting: Internal Medicine

## 2016-05-01 ENCOUNTER — Encounter (HOSPITAL_COMMUNITY): Payer: Self-pay | Admitting: Emergency Medicine

## 2016-05-01 DIAGNOSIS — Z79899 Other long term (current) drug therapy: Secondary | ICD-10-CM | POA: Insufficient documentation

## 2016-05-01 DIAGNOSIS — R1032 Left lower quadrant pain: Secondary | ICD-10-CM

## 2016-05-01 DIAGNOSIS — K5732 Diverticulitis of large intestine without perforation or abscess without bleeding: Secondary | ICD-10-CM | POA: Insufficient documentation

## 2016-05-01 DIAGNOSIS — E119 Type 2 diabetes mellitus without complications: Secondary | ICD-10-CM | POA: Insufficient documentation

## 2016-05-01 DIAGNOSIS — F17290 Nicotine dependence, other tobacco product, uncomplicated: Secondary | ICD-10-CM | POA: Insufficient documentation

## 2016-05-01 DIAGNOSIS — I10 Essential (primary) hypertension: Secondary | ICD-10-CM | POA: Insufficient documentation

## 2016-05-01 LAB — CBC
HCT: 43.4 % (ref 39.0–52.0)
Hemoglobin: 15 g/dL (ref 13.0–17.0)
MCH: 30.3 pg (ref 26.0–34.0)
MCHC: 34.6 g/dL (ref 30.0–36.0)
MCV: 87.7 fL (ref 78.0–100.0)
PLATELETS: 308 10*3/uL (ref 150–400)
RBC: 4.95 MIL/uL (ref 4.22–5.81)
RDW: 12.7 % (ref 11.5–15.5)
WBC: 8.7 10*3/uL (ref 4.0–10.5)

## 2016-05-01 LAB — COMPREHENSIVE METABOLIC PANEL
ALK PHOS: 66 U/L (ref 38–126)
ALT: 30 U/L (ref 17–63)
AST: 23 U/L (ref 15–41)
Albumin: 3.6 g/dL (ref 3.5–5.0)
Anion gap: 9 (ref 5–15)
BILIRUBIN TOTAL: 0.6 mg/dL (ref 0.3–1.2)
BUN: 10 mg/dL (ref 6–20)
CALCIUM: 9 mg/dL (ref 8.9–10.3)
CO2: 25 mmol/L (ref 22–32)
CREATININE: 1.18 mg/dL (ref 0.61–1.24)
Chloride: 102 mmol/L (ref 101–111)
Glucose, Bld: 127 mg/dL — ABNORMAL HIGH (ref 65–99)
Potassium: 3.6 mmol/L (ref 3.5–5.1)
Sodium: 136 mmol/L (ref 135–145)
Total Protein: 7.3 g/dL (ref 6.5–8.1)

## 2016-05-01 LAB — URINALYSIS, ROUTINE W REFLEX MICROSCOPIC
Bilirubin Urine: NEGATIVE
GLUCOSE, UA: NEGATIVE mg/dL
HGB URINE DIPSTICK: NEGATIVE
KETONES UR: NEGATIVE mg/dL
LEUKOCYTES UA: NEGATIVE
Nitrite: NEGATIVE
Protein, ur: NEGATIVE mg/dL
Specific Gravity, Urine: 1.032 — ABNORMAL HIGH (ref 1.005–1.030)
pH: 5 (ref 5.0–8.0)

## 2016-05-01 LAB — LIPASE, BLOOD: LIPASE: 17 U/L (ref 11–51)

## 2016-05-01 NOTE — ED Triage Notes (Signed)
The patient presented to the [facility] with a complaint of lower left sided abdominal pain x 4 days. The patient reported some mild nausea but denied V/D and stated that she has been having bowel movements.

## 2016-05-01 NOTE — ED Triage Notes (Signed)
Pt c/o left lower abd pain 4/0 now since last Sunday getting worse, pt states he is having intermittent fever, chills, nausea and diaphoresis. Denies any vomiting or diarrhea.

## 2016-05-01 NOTE — Discharge Instructions (Signed)
To go to [facility] emergency department now for evaluation of persistent left lower quadrant abdominal pain.

## 2016-05-01 NOTE — ED Provider Notes (Signed)
CSN: [id number]     Arrival date & time 05/01/16  1941 History   First MD Initiated Contact with Patient 05/01/16 2129     Chief Complaint  Patient presents with  . Abdominal Pain   (Consider location/radiation/quality/duration/timing/severity/associated sxs/prior Treatment) 36 year old male presents to the urgent care with persistent left lower quadrant pain and tenderness for 3 days. He states the initial day the pain was severe and prolonged. He has had this associated lightheadedness, tinnitus, weakness and 2 days ago he had developed a temperature 100.2 degrees. This was associated with chills, sweating, decreased appetite and nausea. No vomiting or diarrhea. He is also had a headache. Currently his temperature is 99.0 degrees. Otherwise vital signs are within normal limits.      Past Medical History:  Diagnosis Date  . Depression   . Diabetes mellitus type II, uncontrolled (HCC)   . Eczema   . Finger laceration involving tendon 05/20/2014   right index, long, ring, small fingers - tendon/artery/nerve lac.  Marland Kitchen GERD (gastroesophageal reflux disease)   . History of cardiac murmur as a child   . History of MRSA infection ~ 2007   back  . Hypertension    states under control with med., has been on med. x 1 yr.  . Migraines   . Sleep apnea    uses CPAP nightly   Past Surgical History:  Procedure Laterality Date  . NERVE, TENDON AND ARTERY REPAIR Right 05/24/2014   Procedure: RIGHT INDEX LONG RING SMALL/REPAIR TENDON, ARTERY NERVE;  Surgeon: Betha Loa, MD;  Location: [facility];  Service: Orthopedics;  Laterality: Right;  . ORBITAL FRACTURE SURGERY Left 2003   Family History  Problem Relation Age of Onset  . Arthritis Maternal Grandmother   . Diabetes Maternal Grandmother   . Heart disease Maternal Grandmother   . Hypertension Father   . Diabetes Father   . Colon polyps Father   . Hypertension Mother   . Heart disease Mother   . COPD Mother   . Cancer  Paternal Grandmother     spine cancer  . Hypertension Sister    Social History  Substance Use Topics  . Smoking status: Current Every Day Smoker    Years: 5.00    Types: Cigars  . Smokeless tobacco: Never Used     Comment: 5 cigars/day  . Alcohol use 0.0 oz/week     Comment: occasionally    Review of Systems  Constitutional: Positive for activity change, appetite change, fatigue and fever.  HENT: Negative.   Respiratory: Negative.   Gastrointestinal: Positive for abdominal pain and nausea. Negative for vomiting.  Genitourinary: Negative.   Musculoskeletal: Negative.   Skin: Negative.   Neurological: Positive for light-headedness and headaches.  All other systems reviewed and are negative.   Allergies  Shellfish allergy  Home Medications   Prior to Admission medications   Not on File   Meds Ordered and Administered this Visit  Medications - No data to display  BP (!) 137/92 (BP Location: Right Wrist)   Pulse 75   Temp 99 F (37.2 C) (Oral)   Resp 20   SpO2 98%  No data found.   Physical Exam  Constitutional: He is oriented to person, place, and time. He appears well-developed and well-nourished. No distress.  Eyes: EOM are normal.  Neck: Neck supple.  Cardiovascular: Normal rate, regular rhythm, normal heart sounds and intact distal pulses.   Pulmonary/Chest: Effort normal and breath sounds normal. No respiratory distress.  Abdominal:  Hypoactive bowel sounds especially in the left lower quadrant. There is marked tenderness to the left lower quadrant. Abdomen is soft. No upper abdominal tenderness.  Musculoskeletal: He exhibits no edema.  Neurological: He is alert and oriented to person, place, and time.  Skin: Skin is warm and dry.  Psychiatric: He has a normal mood and affect.  Nursing note and vitals reviewed.   Urgent Care Course     Procedures (including critical care time)  Labs Review Labs Reviewed - No data to display  Imaging Review No  results found.   Visual Acuity Review  Right Eye Distance:   Left Eye Distance:   Bilateral Distance:    Right Eye Near:   Left Eye Near:    Bilateral Near:         MDM   1. Left lower quadrant pain    36 year old male with 3 day history of left lower quadrant pain severe at times a little better now. He is also had persistent tenderness. Associated symptoms and can light headedness and fever up to 102. His had chills and sweating. Differential includes diverticulitis, other infection, obstruction.    Hayden Rasmussen, NP 05/01/16 2149

## 2016-05-02 ENCOUNTER — Emergency Department (HOSPITAL_COMMUNITY): Payer: 59

## 2016-05-02 ENCOUNTER — Emergency Department (HOSPITAL_COMMUNITY)
Admission: EM | Admit: 2016-05-02 | Discharge: 2016-05-02 | Disposition: A | Discharge status: Home / Self Care | Payer: 59 | Attending: Emergency Medicine | Admitting: Emergency Medicine

## 2016-05-02 DIAGNOSIS — K5732 Diverticulitis of large intestine without perforation or abscess without bleeding: Secondary | ICD-10-CM

## 2016-05-02 MED ORDER — METRONIDAZOLE 500 MG PO TABS
500.0000 mg | ORAL_TABLET | Freq: Three times a day (TID) | ORAL | 0 refills | Status: DC
Start: 2016-05-02 — End: 2017-04-24

## 2016-05-02 MED ORDER — IOPAMIDOL (ISOVUE-300) INJECTION 61%
INTRAVENOUS | Status: AC
  Administered 2016-05-02: 100 mL
  Filled 2016-05-02: qty 100

## 2016-05-02 MED ORDER — CIPROFLOXACIN HCL 500 MG PO TABS: 500.0000 mg | ORAL_TABLET | Freq: Two times a day (BID) | ORAL | 0 refills | Status: DC

## 2016-05-02 MED ORDER — CIPROFLOXACIN HCL 500 MG PO TABS
500.0000 mg | ORAL_TABLET | Freq: Once | ORAL | Status: AC
  Administered 2016-05-02: 500 mg via ORAL
  Filled 2016-05-02: qty 1

## 2016-05-02 MED ORDER — METRONIDAZOLE 500 MG PO TABS
500.0000 mg | ORAL_TABLET | Freq: Once | ORAL | Status: AC
  Administered 2016-05-02: 500 mg via ORAL
  Filled 2016-05-02: qty 1

## 2016-05-02 MED ORDER — HYDROCODONE-ACETAMINOPHEN 5-325 MG PO TABS: ORAL_TABLET | ORAL | 0 refills | Status: DC

## 2016-05-02 NOTE — ED Provider Notes (Signed)
[facility]-EMERGENCY DEPT Provider Note   CSN: [id number] Arrival date & time: 05/01/16  2201     History   Chief Complaint Chief Complaint  Patient presents with  . Abdominal Pain    HPI Joseph Barry is a 36 y.o. male.  Patient presents with 3 days of left lower quadrant abdominal pain that is sharp and radiates to the back. Patient was seen at urgent care and transferred to the emergency department for further workup. He has had associated fevers, chills, nausea and sweats. He has not had any vomiting, diarrhea, blood in the stool, urinary symptoms including dysuria or hematuria. No previous colonoscopy. No previous abdominal surgeries. Patient has had 2 similar episodes of pain which have been less severe. These were treated with Pepcid and stool softeners and resolved. No other treatments prior to arrival.      Past Medical History:  Diagnosis Date  . Depression   . Diabetes mellitus type II, uncontrolled (HCC)   . Eczema   . Finger laceration involving tendon 05/20/2014   right index, long, ring, small fingers - tendon/artery/nerve lac.  Marland Kitchen GERD (gastroesophageal reflux disease)   . History of cardiac murmur as a child   . History of MRSA infection ~ 2007   back  . Hypertension    states under control with med., has been on med. x 1 yr.  . Migraines   . Sleep apnea    uses CPAP nightly    Patient Active Problem List   Diagnosis Date Noted  . Obesity 10/28/2014  . Depression 09/13/2014  . Tobacco use disorder 06/13/2014  . Flexor tendon laceration, finger, open wound 05/24/2014  . Essential hypertension 01/17/2014  . Diabetes type 2, uncontrolled (HCC) 01/17/2014    Past Surgical History:  Procedure Laterality Date  . NERVE, TENDON AND ARTERY REPAIR Right 05/24/2014   Procedure: RIGHT INDEX LONG RING SMALL/REPAIR TENDON, ARTERY NERVE;  Surgeon: Betha Loa, MD;  Location: [facility];  Service: Orthopedics;  Laterality: Right;  . ORBITAL FRACTURE  SURGERY Left 2003       Home Medications    Prior to Admission medications   Not on File    Family History Family History  Problem Relation Age of Onset  . Arthritis Maternal Grandmother   . Diabetes Maternal Grandmother   . Heart disease Maternal Grandmother   . Hypertension Father   . Diabetes Father   . Colon polyps Father   . Hypertension Mother   . Heart disease Mother   . COPD Mother   . Cancer Paternal Grandmother     spine cancer  . Hypertension Sister     Social History Social History  Substance Use Topics  . Smoking status: Current Every Day Smoker    Years: 5.00    Types: Cigars  . Smokeless tobacco: Never Used     Comment: 5 cigars/day  . Alcohol use 0.0 oz/week     Comment: occasionally     Allergies   Shellfish allergy   Review of Systems Review of Systems  Constitutional: Positive for fever.  HENT: Negative for rhinorrhea and sore throat.   Eyes: Negative for redness.  Respiratory: Negative for cough.   Cardiovascular: Negative for chest pain.  Gastrointestinal: Positive for abdominal pain and nausea. Negative for blood in stool, diarrhea and vomiting.  Genitourinary: Negative for dysuria.  Musculoskeletal: Negative for myalgias.  Skin: Negative for rash.  Neurological: Negative for headaches.     Physical Exam Updated Vital Signs BP Marland Kitchen)  144/96   Pulse 62   Temp 98.7 F (37.1 C) (Oral)   Resp 18   Ht  (1.778 m)   Wt 122.5 kg   SpO2 99%   BMI 38.74 kg/m   Physical Exam  Constitutional: He appears well-developed and well-nourished.  HENT:  Head: Normocephalic and atraumatic.  Mouth/Throat: Oropharynx is clear and moist.  Eyes: Conjunctivae are normal. Right eye exhibits no discharge. Left eye exhibits no discharge.  Neck: Normal range of motion. Neck supple.  Cardiovascular: Normal rate, regular rhythm and normal heart sounds.   Pulmonary/Chest: Effort normal and breath sounds normal. No respiratory distress. He has  no wheezes. He has no rales.  Abdominal: Soft. There is tenderness (Moderate, focal left lower quadrant abdominal pain). There is no rebound and no guarding.  Neurological: He is alert.  Skin: Skin is warm and dry.  Psychiatric: He has a normal mood and affect.  Nursing note and vitals reviewed.    ED Treatments / Results  Labs (all labs ordered are listed, but only abnormal results are displayed) Labs Reviewed  COMPREHENSIVE METABOLIC PANEL - Abnormal; Notable for the following:       Result Value   Glucose, Bld 127 (*)    All other components within normal limits  URINALYSIS, ROUTINE W REFLEX MICROSCOPIC - Abnormal; Notable for the following:    Specific Gravity, Urine 1.032 (*)    All other components within normal limits  LIPASE, BLOOD  CBC    Radiology Ct Abdomen Pelvis W Contrast  Result Date: 05/02/2016 CLINICAL DATA:  Left lower quadrant pain EXAM: CT ABDOMEN AND PELVIS WITH CONTRAST TECHNIQUE: Multidetector CT imaging of the abdomen and pelvis was performed using the standard protocol following bolus administration of intravenous contrast. CONTRAST:  ISOVUE-300 IOPAMIDOL (ISOVUE-300) INJECTION 61% COMPARISON:  None. FINDINGS: LOWER CHEST: Lung bases are clear. Included heart size is normal. No pericardial effusion. HEPATOBILIARY: Liver and gallbladder are normal. PANCREAS: Normal. SPLEEN: Normal. ADRENALS/URINARY TRACT: Kidneys are orthotopic, demonstrating symmetric enhancement. No nephrolithiasis, hydronephrosis or solid renal masses. The unopacified ureters are normal in course and caliber. Urinary bladder is partially distended and unremarkable. Normal adrenal glands. STOMACH/BOWEL: Pericolonic inflammation associated with an inflamed diverticulum involving the distal descending colon. No abscess or free air. No bowel obstruction. The stomach and small intestine are normal. Normal appendix. VASCULAR/LYMPHATIC: Aortoiliac vessels are normal in course and caliber. No  lymphadenopathy by CT size criteria. REPRODUCTIVE: Normal. OTHER: No intraperitoneal free fluid or free air. MUSCULOSKELETAL: Nonacute. IMPRESSION: Acute uncomplicated distal descending colonic diverticulitis. Electronically Signed   By: Tollie Eth M.D.   On: 05/02/2016 03:04    Procedures Procedures (including critical care time)  Medications Ordered in ED Medications  iopamidol (ISOVUE-300) 61 % injection (not administered)     Initial Impression / Assessment and Plan / ED Course  I have reviewed the triage vital signs and the nursing notes.  Pertinent labs & imaging results that were available during my care of the patient were reviewed by me and considered in my medical decision making (see chart for details).     Patient seen and examined. Labs reviewed. Patient offered pain medication, declines. Given focal pain with associated reported fevers, will image abdomen.  Vital signs reviewed and are as follows: BP (!) 144/96   Pulse 62   Temp 98.7 F (37.1 C) (Oral)   Resp 18   Ht  (1.778 m)   Wt 122.5 kg   SpO2 99%   BMI 38.74  kg/m   3:30 AM patient updated on results. Oral antibiotics given in emergency department. Will discharge to home with prescriptions for antibiotic and Vicodin. Encouraged PCP follow-up in 3-5 days for recheck of symptoms. Encouraged GI follow-up, referral given.  The patient was urged to return to the Emergency Department immediately with worsening of current symptoms, worsening abdominal pain, persistent vomiting, blood noted in stools, fever, or any other concerns. The patient verbalized understanding.   Patient counseled on use of narcotic pain medications. Counseled not to combine these medications with others containing tylenol. Urged not to drink alcohol, drive, or perform any other activities that requires focus while taking these medications. The patient verbalizes understanding and agrees with the plan.    Final Clinical Impressions(s)  / ED Diagnoses   Final diagnoses:  Diverticulitis large intestine w/o perforation or abscess w/o bleeding   Patient with left lower quadrant abdominal pain, reassuring lab work. Uncomplicated diverticulitis diagnosed on CT. Patient appears well, tolerating by mouth's, appropriate for outpatient treatment. He has PCP follow-up.   New Prescriptions New Prescriptions   CIPROFLOXACIN (CIPRO) 500 MG TABLET    Take 1 tablet (500 mg total) by mouth 2 (two) times daily.   HYDROCODONE-ACETAMINOPHEN (NORCO/VICODIN) 5-325 MG TABLET    Take 1-2 tablets every 6 hours as needed for severe pain   METRONIDAZOLE (FLAGYL) 500 MG TABLET    Take 1 tablet (500 mg total) by mouth 3 (three) times daily.     Renne Crigler, PA-C 05/02/16 1610    Geoffery Lyons, MD 05/02/16 [phone]

## 2016-05-02 NOTE — Discharge Instructions (Signed)
Please read and follow all provided instructions.  Your diagnoses today include:  1. Diverticulitis large intestine w/o perforation or abscess w/o bleeding     Tests performed today include:  Blood counts and electrolytes  Blood tests to check liver and kidney function  Blood tests to check pancreas function  Urine test to look for infection  CT scan of your abdomen - shows uncomplicated diverticulitis  Vital signs. See below for your results today.   Medications prescribed:   Vicodin (hydrocodone/acetaminophen) - narcotic pain medication  DO NOT drive or perform any activities that require you to be awake and alert because this medicine can make you drowsy. BE VERY CAREFUL not to take multiple medicines containing Tylenol (also called acetaminophen). Doing so can lead to an overdose which can damage your liver and cause liver failure and possibly death.   Ciprofloxacin - antibiotic  You have been prescribed an antibiotic medicine: take the entire course of medicine even if you are feeling better. Stopping early can cause the antibiotic not to work.   Metronidazole - antibiotic  You have been prescribed an antibiotic medicine: take the entire course of medicine even if you are feeling better. Stopping early can cause the antibiotic not to work. Do not drink alcohol when taking this medication.   Take any prescribed medications only as directed.  Home care instructions:   Follow any educational materials contained in this packet.  Follow-up instructions: Please follow-up with your primary care provider in the next 3 days for further evaluation of your symptoms.    Return instructions:  SEEK IMMEDIATE MEDICAL ATTENTION IF:  The pain does not go away or becomes severe   A temperature above 101F develops   Repeated vomiting occurs (multiple episodes)   The pain becomes localized to portions of the abdomen. The right side could possibly be appendicitis. In an adult,  the left lower portion of the abdomen could be colitis or diverticulitis.   Blood is being passed in stools or vomit (bright red or black tarry stools)   You develop chest pain, difficulty breathing, dizziness or fainting, or become confused, poorly responsive, or inconsolable (young children)  If you have any other emergent concerns regarding your health  Additional Information: Abdominal (belly) pain can be caused by many things. Your caregiver performed an examination and possibly ordered blood/urine tests and imaging (CT scan, x-rays, ultrasound). Many cases can be observed and treated at home after initial evaluation in the emergency department. Even though you are being discharged home, abdominal pain can be unpredictable. Therefore, you need a repeated exam if your pain does not resolve, returns, or worsens. Most patients with abdominal pain don't have to be admitted to the hospital or have surgery, but serious problems like appendicitis and gallbladder attacks can start out as nonspecific pain. Many abdominal conditions cannot be diagnosed in one visit, so follow-up evaluations are very important.  Your vital signs today were: BP (!) 144/96    Pulse 62    Temp 98.7 F (37.1 C) (Oral)    Resp 16    Ht  (1.778 m)    Wt 122.5 kg    SpO2 99%    BMI 38.74 kg/m  If your blood pressure (bp) was elevated above 135/85 this visit, please have this repeated by your doctor within one month. --------------

## 2016-05-02 NOTE — ED Notes (Signed)
Patient transported to CT 

## 2016-09-27 ENCOUNTER — Encounter: Payer: Self-pay | Admitting: Adult Health

## 2017-04-23 ENCOUNTER — Emergency Department (HOSPITAL_BASED_OUTPATIENT_CLINIC_OR_DEPARTMENT_OTHER)
Admission: EM | Admit: 2017-04-23 | Discharge: 2017-04-24 | Disposition: A | Discharge status: Home / Self Care | Payer: 59 | Attending: Emergency Medicine | Admitting: Emergency Medicine

## 2017-04-23 ENCOUNTER — Emergency Department (HOSPITAL_BASED_OUTPATIENT_CLINIC_OR_DEPARTMENT_OTHER): Payer: 59

## 2017-04-23 ENCOUNTER — Other Ambulatory Visit: Payer: Self-pay

## 2017-04-23 ENCOUNTER — Encounter (HOSPITAL_BASED_OUTPATIENT_CLINIC_OR_DEPARTMENT_OTHER): Payer: Self-pay

## 2017-04-23 DIAGNOSIS — E1165 Type 2 diabetes mellitus with hyperglycemia: Secondary | ICD-10-CM | POA: Diagnosis not present

## 2017-04-23 DIAGNOSIS — G4700 Insomnia, unspecified: Secondary | ICD-10-CM | POA: Insufficient documentation

## 2017-04-23 DIAGNOSIS — I10 Essential (primary) hypertension: Secondary | ICD-10-CM | POA: Diagnosis not present

## 2017-04-23 DIAGNOSIS — J40 Bronchitis, not specified as acute or chronic: Secondary | ICD-10-CM | POA: Diagnosis not present

## 2017-04-23 DIAGNOSIS — R350 Frequency of micturition: Secondary | ICD-10-CM | POA: Insufficient documentation

## 2017-04-23 DIAGNOSIS — R739 Hyperglycemia, unspecified: Secondary | ICD-10-CM | POA: Insufficient documentation

## 2017-04-23 DIAGNOSIS — R42 Dizziness and giddiness: Secondary | ICD-10-CM | POA: Diagnosis not present

## 2017-04-23 DIAGNOSIS — Z794 Long term (current) use of insulin: Secondary | ICD-10-CM | POA: Diagnosis not present

## 2017-04-23 DIAGNOSIS — F17290 Nicotine dependence, other tobacco product, uncomplicated: Secondary | ICD-10-CM | POA: Diagnosis not present

## 2017-04-23 DIAGNOSIS — R05 Cough: Secondary | ICD-10-CM | POA: Diagnosis not present

## 2017-04-23 DIAGNOSIS — R358 Other polyuria: Secondary | ICD-10-CM | POA: Diagnosis not present

## 2017-04-23 DIAGNOSIS — R0602 Shortness of breath: Secondary | ICD-10-CM | POA: Diagnosis not present

## 2017-04-23 DIAGNOSIS — Z7984 Long term (current) use of oral hypoglycemic drugs: Secondary | ICD-10-CM | POA: Insufficient documentation

## 2017-04-23 DIAGNOSIS — H538 Other visual disturbances: Secondary | ICD-10-CM | POA: Insufficient documentation

## 2017-04-23 LAB — COMPREHENSIVE METABOLIC PANEL
ALBUMIN: 4.5 g/dL (ref 3.5–5.0)
ALT: 25 U/L (ref 17–63)
AST: 19 U/L (ref 15–41)
Alkaline Phosphatase: 98 U/L (ref 38–126)
Anion gap: 14 (ref 5–15)
BILIRUBIN TOTAL: 0.8 mg/dL (ref 0.3–1.2)
BUN: 21 mg/dL — AB (ref 6–20)
CO2: 22 mmol/L (ref 22–32)
CREATININE: 1.37 mg/dL — AB (ref 0.61–1.24)
Calcium: 9.3 mg/dL (ref 8.9–10.3)
Chloride: 90 mmol/L — ABNORMAL LOW (ref 101–111)
GFR calc Af Amer: >60 mL/min (ref >60)
GLUCOSE: 518 mg/dL — AB (ref 65–99)
POTASSIUM: 3.8 mmol/L (ref 3.5–5.1)
Sodium: 126 mmol/L — ABNORMAL LOW (ref 135–145)
TOTAL PROTEIN: 8.1 g/dL (ref 6.5–8.1)

## 2017-04-23 LAB — CBG MONITORING, ED
GLUCOSE-CAPILLARY: 382 mg/dL — AB (ref 65–99)
Glucose-Capillary: 492 mg/dL — ABNORMAL HIGH (ref 65–99)

## 2017-04-23 MED ORDER — INSULIN REGULAR HUMAN 100 UNIT/ML IJ SOLN
8.0000 [IU] | Freq: Once | INTRAMUSCULAR | Status: AC
  Administered 2017-04-23: 8 [IU] via INTRAVENOUS
  Filled 2017-04-23: qty 1

## 2017-04-23 MED ORDER — SODIUM CHLORIDE 0.9 % IV BOLUS
1000.0000 mL | Freq: Once | INTRAVENOUS | Status: AC
  Administered 2017-04-23: 1000 mL via INTRAVENOUS

## 2017-04-23 NOTE — ED Notes (Signed)
Date and time results received: 04/23/17 2039 (use smartphrase ".now" to insert current time)  Test: glucose Critical Value: 518  Name of Provider Notified: Dr. Rubin PayorPickering  Orders Received? Or Actions Taken?: awaiting new orders.

## 2017-04-23 NOTE — ED Provider Notes (Addendum)
[facility] EMERGENCY DEPARTMENT Provider Note   CSN: [id number] Arrival date & time: 04/23/17  1918     History   Chief Complaint Chief Complaint  Patient presents with  . Hyperglycemia    HPI Joseph Barry is a 37 y.o. male.  HPI Patient presents transferred from urgent care with new onset diabetes.  Has had cough for the last couple weeks he has had blurred vision polyuria polydipsia.  Glucose greater than 500 at the [facility] urgent care.  Reportedly also had hemoglobin A1c of 12.  States he felt a little dizzy today.  Has had urinary frequency and difficulty sleeping.  No real sputum production but has had cough and occasional chills.  Has not been on antibiotics. Past Medical History:  Diagnosis Date  . Depression   . Diabetes mellitus type II, uncontrolled (HCC)   . Eczema   . Finger laceration involving tendon 05/20/2014   right index, long, ring, small fingers - tendon/artery/nerve lac.  Marland Kitchen GERD (gastroesophageal reflux disease)   . History of cardiac murmur as a child   . History of MRSA infection ~ 2007   back  . Hypertension    states under control with med., has been on med. x 1 yr.  . Migraines   . Sleep apnea    uses CPAP nightly    Patient Active Problem List   Diagnosis Date Noted  . Obesity 10/28/2014  . Depression 09/13/2014  . Tobacco use disorder 06/13/2014  . Flexor tendon laceration, finger, open wound 05/24/2014  . Essential hypertension 01/17/2014  . Diabetes type 2, uncontrolled (HCC) 01/17/2014    Past Surgical History:  Procedure Laterality Date  . NERVE, TENDON AND ARTERY REPAIR Right 05/24/2014   Procedure: RIGHT INDEX LONG RING SMALL/REPAIR TENDON, ARTERY NERVE;  Surgeon: Betha Loa, MD;  Location: [facility];  Service: Orthopedics;  Laterality: Right;  . ORBITAL FRACTURE SURGERY Left 2003        Home Medications    Prior to Admission medications   Medication Sig Start Date End Date Taking? Authorizing  Provider  ciprofloxacin (CIPRO) 500 MG tablet Take 1 tablet (500 mg total) by mouth 2 (two) times daily. 05/02/16   Renne Crigler, PA-C  HYDROcodone-acetaminophen (NORCO/VICODIN) 5-325 MG tablet Take 1-2 tablets every 6 hours as needed for severe pain 05/02/16   Renne Crigler, PA-C  metFORMIN (GLUCOPHAGE) 500 MG tablet Take 1 tablet (500 mg total) by mouth 2 (two) times daily with a meal. 04/24/17   Benjiman Core, MD  metroNIDAZOLE (FLAGYL) 500 MG tablet Take 1 tablet (500 mg total) by mouth 3 (three) times daily. 05/02/16   Renne Crigler, PA-C    Family History Family History  Problem Relation Age of Onset  . Arthritis Maternal Grandmother   . Diabetes Maternal Grandmother   . Heart disease Maternal Grandmother   . Hypertension Father   . Diabetes Father   . Colon polyps Father   . Hypertension Mother   . Heart disease Mother   . COPD Mother   . Cancer Paternal Grandmother        spine cancer  . Hypertension Sister     Social History Social History   Tobacco Use  . Smoking status: Current Every Day Smoker    Years: 5.00    Types: Cigars  . Smokeless tobacco: Never Used  . Tobacco comment: 5 cigars/day  Substance Use Topics  . Alcohol use: Yes    Alcohol/week: 0.0 oz    Comment: occasionally  .  Drug use: No     Allergies   Shellfish allergy   Review of Systems Review of Systems  Constitutional: Positive for appetite change.  HENT: Negative for congestion.   Eyes: Positive for visual disturbance.  Respiratory: Positive for cough. Negative for shortness of breath and wheezing.   Gastrointestinal: Negative for abdominal pain.  Endocrine: Positive for polydipsia and polyuria. Negative for polyphagia.  Genitourinary: Negative for flank pain.  Musculoskeletal: Negative for back pain.  Skin: Negative for rash.  Neurological: Negative for weakness.  Hematological: Negative for adenopathy.  Psychiatric/Behavioral: Negative for confusion.     Physical  Exam Updated Vital Signs BP (!) 134/97   Pulse (!) 58   Temp 98 F (36.7 C) (Oral)   Resp 16   Ht 5\' 10"  (1.778 m)   Wt 123.8 kg (273 lb)   SpO2 100%   BMI 39.17 kg/m   Physical Exam  Constitutional: He appears well-developed.  HENT:  Head: Atraumatic.  Neck: Neck supple.  Cardiovascular: Normal rate.  Pulmonary/Chest: Effort normal. He has no wheezes. He has no rales.  Abdominal: There is no tenderness.  Musculoskeletal: He exhibits no edema.  Neurological: He is alert.  Skin: Skin is warm. Capillary refill takes less than 2 seconds.  Psychiatric: He has a normal mood and affect.     ED Treatments / Results  Labs (all labs ordered are listed, but only abnormal results are displayed) Labs Reviewed  COMPREHENSIVE METABOLIC PANEL - Abnormal; Notable for the following components:      Result Value   Sodium 126 (*)    Chloride 90 (*)    Glucose, Bld 518 (*)    BUN 21 (*)    Creatinine, Ser 1.37 (*)    All other components within normal limits  CBG MONITORING, ED - Abnormal; Notable for the following components:   Glucose-Capillary 492 (*)    All other components within normal limits  CBG MONITORING, ED - Abnormal; Notable for the following components:   Glucose-Capillary 382 (*)    All other components within normal limits  CBG MONITORING, ED - Abnormal; Notable for the following components:   Glucose-Capillary 189 (*)    All other components within normal limits    EKG None  Radiology Dg Chest 2 View  Result Date: 04/23/2017 CLINICAL DATA:  Cough, short of breath EXAM: CHEST - 2 VIEW COMPARISON:  None. FINDINGS: Normal mediastinum and cardiac silhouette. Normal pulmonary vasculature. No evidence of effusion, infiltrate, or pneumothorax. No acute bony abnormality. IMPRESSION: No acute cardiopulmonary process. Electronically Signed   By: Genevive BiStewart  Edmunds M.D.   On: 04/23/2017 21:00    Procedures Procedures (including critical care time)  Medications Ordered  in ED Medications  sodium chloride 0.9 % bolus 1,000 mL (0 mLs Intravenous Stopped 04/23/17 2113)  sodium chloride 0.9 % bolus 1,000 mL (0 mLs Intravenous Stopped 04/23/17 2253)  sodium chloride 0.9 % bolus 1,000 mL (0 mLs Intravenous Stopped 04/24/17 0010)  insulin regular (NOVOLIN R,HUMULIN R) 100 units/mL injection 8 Units (8 Units Intravenous Given 04/23/17 2253)     Initial Impression / Assessment and Plan / ED Course  I have reviewed the triage vital signs and the nursing notes.  Pertinent labs & imaging results that were available during my care of the patient were reviewed by me and considered in my medical decision making (see chart for details).     Patient with hyperglycemia.  URI symptoms.  New-onset diabetes.  Reviewed outpatient labs that had hemoglobin A1c  of 12.  Hyperglycemia without DKA.  Some mild renal insufficiency.  Fluid boluses given and insulin given. CBG improved. Will follow with PCP.  Final Clinical Impressions(s) / ED Diagnoses   Final diagnoses:  Hyperglycemia  Type 2 diabetes mellitus with hyperglycemia, without long-term current use of insulin [facility])    ED Discharge Orders        Ordered    metFORMIN (GLUCOPHAGE) 500 MG tablet  2 times daily with meals     04/24/17 Ladean Raya, MD 04/24/17 Lorin Picket    Benjiman Core, MD 04/24/17 [phone]

## 2017-04-23 NOTE — ED Triage Notes (Addendum)
Pt reports he was referred by Urgent Care for new onset DM. He reports one month history of polydipsia, polyuria, blurry vision. Glucose >500. Pt also has had cough, fatigue, cold symptoms for one week.

## 2017-04-24 ENCOUNTER — Encounter: Payer: Self-pay | Admitting: Adult Health

## 2017-04-24 ENCOUNTER — Ambulatory Visit (INDEPENDENT_AMBULATORY_CARE_PROVIDER_SITE_OTHER): Payer: 59 | Admitting: Adult Health

## 2017-04-24 VITALS — BP 138/84 | Temp 97.7°F | Wt 279.0 lb

## 2017-04-24 DIAGNOSIS — IMO0001 Reserved for inherently not codable concepts without codable children: Secondary | ICD-10-CM

## 2017-04-24 DIAGNOSIS — E1165 Type 2 diabetes mellitus with hyperglycemia: Secondary | ICD-10-CM

## 2017-04-24 LAB — GLUCOSE, POCT (MANUAL RESULT ENTRY): POC Glucose: 511 mg/dl — AB (ref 70–99)

## 2017-04-24 LAB — CBG MONITORING, ED: Glucose-Capillary: 189 mg/dL — ABNORMAL HIGH (ref 65–99)

## 2017-04-24 LAB — POCT GLYCOSYLATED HEMOGLOBIN (HGB A1C): Hemoglobin A1C: 12.4

## 2017-04-24 MED ORDER — GLIPIZIDE ER 10 MG PO TB24: 10.0000 mg | ORAL_TABLET | Freq: Every day | ORAL | 1 refills | Status: DC

## 2017-04-24 MED ORDER — METFORMIN HCL 500 MG PO TABS: 500.0000 mg | ORAL_TABLET | Freq: Two times a day (BID) | ORAL | 0 refills | Status: DC

## 2017-04-24 MED ORDER — GLUCOSE BLOOD VI STRP: ORAL_STRIP | 12 refills | Status: DC

## 2017-04-24 MED ORDER — ONETOUCH ULTRASOFT LANCETS MISC: 12 refills | Status: DC

## 2017-04-24 NOTE — Progress Notes (Signed)
Subjective:    Patient ID: Joseph Barry, male    DOB: 1980/06/29, 37 y.o.   MRN: [id number]  HPI  This is a 37 year old male who  has a past medical history of Depression, Diabetes mellitus type II, uncontrolled (HCC), Eczema, Finger laceration involving tendon (05/20/2014), GERD (gastroesophageal reflux disease), History of cardiac murmur as a child, History of MRSA infection (~ 2007), Hypertension, Migraines, and Sleep apnea.  Was last seen in this office July 2017  Resents to the office today status post ER visit today.  He was transferred from urgent care with new onset diabetes.  Per ER note he had a cough for the last few weeks and he had blurred vision and polyuria with polydipsia.  Glucose was greater than 500 at [facility]  urgent care.  Reportedly he had a hemoglobin A1c of 12.  In the ER he was given fluid bolus and insulin.  CBG improved and he was discharged with metformin 500 mg twice daily. He has not filled prescription for Metformin yet   Review of Systems See HPI   Past Medical History:  Diagnosis Date  . Depression   . Diabetes mellitus type II, uncontrolled (HCC)   . Eczema   . Finger laceration involving tendon 05/20/2014   right index, long, ring, small fingers - tendon/artery/nerve lac.  Marland Kitchen GERD (gastroesophageal reflux disease)   . History of cardiac murmur as a child   . History of MRSA infection ~ 2007   back  . Hypertension    states under control with med., has been on med. x 1 yr.  . Migraines   . Sleep apnea    uses CPAP nightly    Social History   Socioeconomic History  . Marital status: Married    Spouse name: Not on file  . Number of children: Not on file  . Years of education: Not on file  . Highest education level: Not on file  Occupational History  . Not on file  Social Needs  . Financial resource strain: Not on file  . Food insecurity:    Worry: Not on file    Inability: Not on file  . Transportation needs:    Medical: Not on file      Non-medical: Not on file  Tobacco Use  . Smoking status: Current Every Day Smoker    Years: 5.00    Types: Cigars  . Smokeless tobacco: Never Used  . Tobacco comment: 5 cigars/day  Substance and Sexual Activity  . Alcohol use: Yes    Alcohol/week: 0.0 oz    Comment: occasionally  . Drug use: No  . Sexual activity: Not on file  Lifestyle  . Physical activity:    Days per week: Not on file    Minutes per session: Not on file  . Stress: Not on file  Relationships  . Social connections:    Talks on phone: Not on file    Gets together: Not on file    Attends religious service: Not on file    Active member of club or organization: Not on file    Attends meetings of clubs or organizations: Not on file    Relationship status: Not on file  . Intimate partner violence:    Fear of current or ex partner: Not on file    Emotionally abused: Not on file    Physically abused: Not on file    Forced sexual activity: Not on file  Other Topics Concern  . Not on  file  Social History Narrative   Going to school for [employer]. Starts in August.    Married for five years   Has step daughter.    Has rabbit and dog.     Past Surgical History:  Procedure Laterality Date  . NERVE, TENDON AND ARTERY REPAIR Right 05/24/2014   Procedure: RIGHT INDEX LONG RING SMALL/REPAIR TENDON, ARTERY NERVE;  Surgeon: Betha LoaKevin Kuzma, MD;  Location: [facility];  Service: Orthopedics;  Laterality: Right;  . ORBITAL FRACTURE SURGERY Left 2003    Family History  Problem Relation Age of Onset  . Arthritis Maternal Grandmother   . Diabetes Maternal Grandmother   . Heart disease Maternal Grandmother   . Hypertension Father   . Diabetes Father   . Colon polyps Father   . Hypertension Mother   . Heart disease Mother   . COPD Mother   . Cancer Paternal Grandmother        spine cancer  . Hypertension Sister     Allergies  Allergen Reactions  . Shellfish Allergy Shortness Of Breath  and Swelling    Current Outpatient Medications on File Prior to Visit  Medication Sig Dispense Refill  . metFORMIN (GLUCOPHAGE) 500 MG tablet Take 1 tablet (500 mg total) by mouth 2 (two) times daily with a meal. (Patient not taking: Reported on 04/24/2017) 60 tablet 0   No current facility-administered medications on file prior to visit.     BP 138/84   Temp 97.7 F (36.5 C) (Oral)   Wt 279 lb (126.6 kg)   BMI 40.03 kg/m       Objective:   Physical Exam  Constitutional: He is oriented to person, place, and time. He appears well-developed and well-nourished. No distress.  Cardiovascular: Normal rate, regular rhythm, normal heart sounds and intact distal pulses. Exam reveals no gallop and no friction rub.  No murmur heard. Pulmonary/Chest: Effort normal and breath sounds normal. No respiratory distress. He has no wheezes. He has no rales. He exhibits no tenderness.  Neurological: He is alert and oriented to person, place, and time.  Skin: Skin is warm and dry. No rash noted. He is not diaphoretic. No erythema. No pallor.  Psychiatric: He has a normal mood and affect. His behavior is normal. Judgment and thought content normal.  Nursing note and vitals reviewed.     Assessment & Plan:  1. Uncontrolled type 2 diabetes mellitus without complication, without long-term current use of insulin (HCC) - Reviewed complications of diabetes, food choices, and need for exercise.  - I am going to start him on Norvasc. Hold off on Metformin at this time.  - POC Glucose (CBG)- 511 - POCT A1C- 12.4  - glipiZIDE (GLUCOTROL XL) 10 MG 24 hr tablet; Take 1 tablet (10 mg total) by mouth daily with breakfast.  Dispense: 30 tablet; Refill: 1 - glucose blood (ONETOUCH VERIO) test strip; Use as instructed  Dispense: 100 each; Refill: 12 - Lancets (ONETOUCH ULTRASOFT) lancets; Use as instructed  Dispense: 100 each; Refill: 12 - Ambulatory referral to Nutrition and Diabetic Education - Follow up in 2  weeks with blood sugar readings   Joseph Freesory Tykeshia Tourangeau, NP

## 2017-05-08 ENCOUNTER — Ambulatory Visit (INDEPENDENT_AMBULATORY_CARE_PROVIDER_SITE_OTHER): Payer: 59 | Admitting: Adult Health

## 2017-05-08 ENCOUNTER — Encounter: Payer: Self-pay | Admitting: Adult Health

## 2017-05-08 DIAGNOSIS — E1165 Type 2 diabetes mellitus with hyperglycemia: Secondary | ICD-10-CM | POA: Diagnosis not present

## 2017-05-08 DIAGNOSIS — IMO0001 Reserved for inherently not codable concepts without codable children: Secondary | ICD-10-CM

## 2017-05-08 MED ORDER — METFORMIN HCL 500 MG PO TABS: 500.0000 mg | ORAL_TABLET | Freq: Two times a day (BID) | ORAL | 0 refills | Status: DC

## 2017-05-08 MED ORDER — GLIPIZIDE ER 10 MG PO TB24: 10.0000 mg | ORAL_TABLET | Freq: Every day | ORAL | 1 refills | Status: DC

## 2017-05-08 NOTE — Progress Notes (Signed)
Subjective:    Patient ID: Joseph Barry, male    DOB: 04-Aug-1980, 37 y.o.   MRN: [id number]  HPI 37 year old male who  has a past medical history of Depression, Diabetes mellitus type II, uncontrolled (HCC), Eczema, Finger laceration involving tendon (05/20/2014), GERD (gastroesophageal reflux disease), History of cardiac murmur as a child, History of MRSA infection (~ 2007), Hypertension, Migraines, and Sleep apnea.  He presents to the office today for two week follow up regarding diabetes.  His last visit was status post emergency room visit after he was transferred there from urgent care due to on new onset diabetes.  At that time glucose was greater than 500 and his hemoglobin A1c was 12.  He was discharged on metformin 500 mg twice daily.   When I saw him last,  glipizide 10 mg extended release was added to his regimen.  He was advised to work on diet and exercise and an order for diabetic education was placed  Today in the office he reports that he has been taking glipizide as directed.  He never got the metformin filled as he thought he was just supposed to use the glipizide.  He has been monitoring his blood sugars at home and per his log most of his blood sugars have been between 200-350.   Has been working on diet, has cut out carbs and a lot of sugars.  He has been able to lose approximately 4 pounds over the last couple weeks  Continues to have blurred vision, does not have polyuria or polydipsia.   Review of Systems See HPI   Past Medical History:  Diagnosis Date  . Depression   . Diabetes mellitus type II, uncontrolled (HCC)   . Eczema   . Finger laceration involving tendon 05/20/2014   right index, long, ring, small fingers - tendon/artery/nerve lac.  Marland Kitchen GERD (gastroesophageal reflux disease)   . History of cardiac murmur as a child   . History of MRSA infection ~ 2007   back  . Hypertension    states under control with med., has been on med. x 1 yr.  . Migraines     . Sleep apnea    uses CPAP nightly    Social History   Socioeconomic History  . Marital status: Married    Spouse name: Not on file  . Number of children: Not on file  . Years of education: Not on file  . Highest education level: Not on file  Occupational History  . Not on file  Social Needs  . Financial resource strain: Not on file  . Food insecurity:    Worry: Not on file    Inability: Not on file  . Transportation needs:    Medical: Not on file    Non-medical: Not on file  Tobacco Use  . Smoking status: Current Every Day Smoker    Years: 5.00    Types: Cigars  . Smokeless tobacco: Never Used  . Tobacco comment: 5 cigars/day  Substance and Sexual Activity  . Alcohol use: Yes    Alcohol/week: 0.0 oz    Comment: occasionally  . Drug use: No  . Sexual activity: Not on file  Lifestyle  . Physical activity:    Days per week: Not on file    Minutes per session: Not on file  . Stress: Not on file  Relationships  . Social connections:    Talks on phone: Not on file    Gets together: Not on file  Attends religious service: Not on file    Active member of club or organization: Not on file    Attends meetings of clubs or organizations: Not on file    Relationship status: Not on file  . Intimate partner violence:    Fear of current or ex partner: Not on file    Emotionally abused: Not on file    Physically abused: Not on file    Forced sexual activity: Not on file  Other Topics Concern  . 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    Married for five years   Has step daughter.    Has rabbit and dog.     Past Surgical History:  Procedure Laterality Date  . NERVE, TENDON AND ARTERY REPAIR Right 05/24/2014   Procedure: RIGHT INDEX LONG RING SMALL/REPAIR TENDON, ARTERY NERVE;  Surgeon: Betha Loa, MD;  Location: [facility];  Service: Orthopedics;  Laterality: Right;  . ORBITAL FRACTURE  SURGERY Left 2003    Family History  Problem Relation Age of Onset  . Arthritis Maternal Grandmother   . Diabetes Maternal Grandmother   . Heart disease Maternal Grandmother   . Hypertension Father   . Diabetes Father   . Colon polyps Father   . Hypertension Mother   . Heart disease Mother   . COPD Mother   . Cancer Paternal Grandmother        spine cancer  . Hypertension Sister     Allergies  Allergen Reactions  . Shellfish Allergy Shortness Of Breath and Swelling    Current Outpatient Medications on File Prior to Visit  Medication Sig Dispense Refill  . glucose blood (ONETOUCH VERIO) test strip Use as instructed 100 each 12  . Lancets (ONETOUCH ULTRASOFT) lancets Use as instructed 100 each 12   No current facility-administered medications on file prior to visit.     BP 128/90   Temp 98.7 F (37.1 C) (Oral)   Wt 275 lb (124.7 kg)   BMI 39.46 kg/m       Objective:   Physical Exam  Constitutional: He is oriented to person, place, and time. He appears well-developed and well-nourished. No distress.  Eyes: Pupils are equal, round, and reactive to light. Conjunctivae and EOM are normal.  Cardiovascular: Normal rate, regular rhythm, normal heart sounds and intact distal pulses. Exam reveals no gallop and no friction rub.  No murmur heard. Pulmonary/Chest: Breath sounds normal. No stridor. No respiratory distress. He has no wheezes. He has no rales. He exhibits no tenderness.  Neurological: He is alert and oriented to person, place, and time.  Skin: Skin is warm and dry. He is not diaphoretic.  Psychiatric: He has a normal mood and affect. His behavior is normal. Judgment and thought content normal.  Nursing note and vitals reviewed.     Assessment & Plan:  1. Uncontrolled type 2 diabetes mellitus without complication, without long-term current use of insulin (HCC) -Continue to work on weight loss through diet [employer] and exercise.  To make an appointment with  diabetic education follow-up in 2 months for repeat A1c.  Return precautions given - glipiZIDE (GLUCOTROL XL) 10 MG 24 hr tablet; Take 1 tablet (10 mg total) by mouth daily with breakfast.  Dispense: 90 tablet; Refill: 1 - metFORMIN (GLUCOPHAGE) 500 MG tablet; Take 1 tablet (500 mg total) by mouth 2 (two) times daily with a meal.  Dispense: 180 tablet; Refill: 0  Dorothyann Peng, NP

## 2017-05-09 ENCOUNTER — Other Ambulatory Visit: Payer: Self-pay | Admitting: Adult Health

## 2017-05-09 DIAGNOSIS — E1165 Type 2 diabetes mellitus with hyperglycemia: Principal | ICD-10-CM

## 2017-05-09 DIAGNOSIS — IMO0001 Reserved for inherently not codable concepts without codable children: Secondary | ICD-10-CM

## 2017-05-09 MED ORDER — ONETOUCH DELICA LANCETS FINE MISC: 12 refills | Status: DC

## 2017-05-09 MED ORDER — METFORMIN HCL 500 MG PO TABS: 500.0000 mg | ORAL_TABLET | Freq: Two times a day (BID) | ORAL | 0 refills | Status: DC

## 2017-05-24 ENCOUNTER — Other Ambulatory Visit: Payer: Self-pay | Admitting: Adult Health

## 2017-05-24 DIAGNOSIS — E1165 Type 2 diabetes mellitus with hyperglycemia: Principal | ICD-10-CM

## 2017-05-24 DIAGNOSIS — IMO0001 Reserved for inherently not codable concepts without codable children: Secondary | ICD-10-CM

## 2017-05-25 ENCOUNTER — Other Ambulatory Visit: Payer: Self-pay | Admitting: Adult Health

## 2017-05-25 DIAGNOSIS — IMO0001 Reserved for inherently not codable concepts without codable children: Secondary | ICD-10-CM

## 2017-05-25 DIAGNOSIS — E1165 Type 2 diabetes mellitus with hyperglycemia: Principal | ICD-10-CM

## 2017-05-27 NOTE — Telephone Encounter (Signed)
Sent to the pharmacy by e-scribe. 

## 2017-06-25 DIAGNOSIS — S79912A Unspecified injury of left hip, initial encounter: Secondary | ICD-10-CM | POA: Diagnosis not present

## 2017-06-25 DIAGNOSIS — S7702XA Crushing injury of left hip, initial encounter: Secondary | ICD-10-CM | POA: Diagnosis not present

## 2017-06-25 DIAGNOSIS — S8991XA Unspecified injury of right lower leg, initial encounter: Secondary | ICD-10-CM | POA: Diagnosis not present

## 2017-06-25 DIAGNOSIS — S7701XA Crushing injury of right hip, initial encounter: Secondary | ICD-10-CM | POA: Diagnosis not present

## 2017-06-25 DIAGNOSIS — S8701XA Crushing injury of right knee, initial encounter: Secondary | ICD-10-CM | POA: Diagnosis not present

## 2017-06-25 DIAGNOSIS — S79911A Unspecified injury of right hip, initial encounter: Secondary | ICD-10-CM | POA: Diagnosis not present

## 2017-07-15 ENCOUNTER — Ambulatory Visit (INDEPENDENT_AMBULATORY_CARE_PROVIDER_SITE_OTHER): Payer: 59 | Admitting: Adult Health

## 2017-07-15 ENCOUNTER — Encounter: Payer: Self-pay | Admitting: Adult Health

## 2017-07-15 VITALS — BP 126/92 | Temp 98.7°F | Wt 277.0 lb

## 2017-07-15 DIAGNOSIS — E1165 Type 2 diabetes mellitus with hyperglycemia: Secondary | ICD-10-CM

## 2017-07-15 DIAGNOSIS — IMO0001 Reserved for inherently not codable concepts without codable children: Secondary | ICD-10-CM

## 2017-07-15 LAB — POCT GLYCOSYLATED HEMOGLOBIN (HGB A1C): HBA1C, POC (CONTROLLED DIABETIC RANGE): 7.6 % — AB (ref 0.0–7.0)

## 2017-07-15 NOTE — Progress Notes (Signed)
Subjective:    Patient ID: Joseph MontesMichael Ma, male    DOB: [birth year], 37 y.o.   MRN: [id number]  HPI 37 year old male who  has a past medical history of Depression, Diabetes mellitus type II, uncontrolled (HCC), Eczema, Finger laceration involving tendon (05/20/2014), GERD (gastroesophageal reflux disease), History of cardiac murmur as a child, History of MRSA infection (~ 2007), Hypertension, Migraines, and Sleep apnea.  He presents to the office today for three month follow up regarding diabetes mellitus. He was seen in the ER on 04/23/2017 for hypoglycemia at which time he was found to have an A1c of 12. During this follow up his in office A1c was 12.4. He was placed on Metformin 500 mg BID and Glipizide 10 mg XR.   Today in the office he reports that he has changed his diet and has been more mindful of sugars and carbs. He has not been checking his blood sugars at home and has not started exercising yet but plans on doing so  Wt Readings from Last 3 Encounters:  07/15/17 277 lb (125.6 kg)  05/08/17 275 lb (124.7 kg)  04/24/17 279 lb (126.6 kg)   Review of Systems See HPI   Past Medical History:  Diagnosis Date  . Depression   . Diabetes mellitus type II, uncontrolled (HCC)   . Eczema   . Finger laceration involving tendon 05/20/2014   right index, long, ring, small fingers - tendon/artery/nerve lac.  Marland Kitchen. GERD (gastroesophageal reflux disease)   . History of cardiac murmur as a child   . History of MRSA infection ~ 2007   back  . Hypertension    states under control with med., has been on med. x 1 yr.  . Migraines   . Sleep apnea    uses CPAP nightly    Social History   Socioeconomic History  . Marital status: Married    Spouse name: Not on file  . Number of children: Not on file  . Years of education: Not on file  . Highest education level: Not on file  Occupational History  . Not on file  Social Needs  . Financial resource strain: Not on file  . Food insecurity:   Worry: Not on file    Inability: Not on file  . Transportation needs:    Medical: Not on file    Non-medical: Not on file  Tobacco Use  . Smoking status: Current Every Day Smoker    Years: 5.00    Types: Cigars  . Smokeless tobacco: Never Used  . Tobacco comment: 5 cigars/day  Substance and Sexual Activity  . Alcohol use: Yes    Alcohol/week: 0.0 oz    Comment: occasionally  . Drug use: No  . Sexual activity: Not on file  Lifestyle  . Physical activity:    Days per week: Not on file    Minutes per session: Not on file  . Stress: Not on file  Relationships  . Social connections:    Talks on phone: Not on file    Gets together: Not on file    Attends religious service: Not on file    Active member of club or organization: Not on file    Attends meetings of clubs or organizations: Not on file    Relationship status: Not on file  . Intimate partner violence:    Fear of current or ex partner: Not on file    Emotionally abused: Not on file    Physically abused: Not  on file    Forced sexual activity: Not on file  Other Topics Concern  . 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    Married for five years   Has step daughter.    Has rabbit and dog.     Past Surgical History:  Procedure Laterality Date  . NERVE, TENDON AND ARTERY REPAIR Right 05/24/2014   Procedure: RIGHT INDEX LONG RING SMALL/REPAIR TENDON, ARTERY NERVE;  Surgeon: Betha Loa, MD;  Location: [facility];  Service: Orthopedics;  Laterality: Right;  . ORBITAL FRACTURE SURGERY Left 2003    Family History  Problem Relation Age of Onset  . Arthritis Maternal Grandmother   . Diabetes Maternal Grandmother   . Heart disease Maternal Grandmother   . Hypertension Father   . Diabetes Father   . Colon polyps Father   . Hypertension Mother   . Heart disease Mother   . COPD Mother   . Cancer Paternal Grandmother        spine cancer  .  Hypertension Sister     Allergies  Allergen Reactions  . Shellfish Allergy Shortness Of Breath and Swelling    Current Outpatient Medications on File Prior to Visit  Medication Sig Dispense Refill  . glipiZIDE (GLUCOTROL XL) 10 MG 24 hr tablet TAKE 1 TABLET(10 MG) BY MOUTH DAILY WITH BREAKFAST 90 tablet 0  . glucose blood (ONETOUCH VERIO) test strip Use as instructed 100 each 12  . metFORMIN (GLUCOPHAGE) 500 MG tablet TAKE 1 TABLET(500 MG) BY MOUTH TWICE DAILY WITH A MEAL 180 tablet 0  . ONETOUCH DELICA LANCETS FINE MISC Use as directed three times per day 100 each 12   No current facility-administered medications on file prior to visit.     BP (!) 126/92   Temp 98.7 F (37.1 C) (Oral)   Wt 277 lb (125.6 kg)   BMI 39.75 kg/m       Objective:   Physical Exam  Constitutional: He is oriented to person, place, and time. He appears well-developed and well-nourished. No distress.  Cardiovascular: Normal rate, regular rhythm, normal heart sounds and intact distal pulses. Exam reveals no gallop and no friction rub.  No murmur heard. Pulmonary/Chest: Effort normal and breath sounds normal.  Musculoskeletal: Normal range of motion.  Neurological: He is alert and oriented to person, place, and time.  Skin: Skin is warm and dry. He is not diaphoretic.  Psychiatric: He has a normal mood and affect. His behavior is normal. Judgment and thought content normal.  Nursing note and vitals reviewed.     Assessment & Plan:  1. Uncontrolled type 2 diabetes mellitus without complication, without long-term current use of insulin (HCC) - POCT A1C- 7.6 - has improved  - Stressed the importance of eating a diabetic diet and frequent aerobic exercise, I would like him to get at least 150 minutes a week.  - Start monitoring BS at home and bring log to next appointment.  - No change in medications  - Follow up in 3 months for CPE   Shirline Frees, NP

## 2017-08-27 ENCOUNTER — Encounter: Payer: Self-pay | Admitting: Adult Health

## 2017-08-27 ENCOUNTER — Encounter: Payer: Self-pay | Admitting: Internal Medicine

## 2017-08-27 DIAGNOSIS — M25531 Pain in right wrist: Secondary | ICD-10-CM | POA: Diagnosis not present

## 2017-08-27 DIAGNOSIS — M19031 Primary osteoarthritis, right wrist: Secondary | ICD-10-CM | POA: Diagnosis not present

## 2017-08-29 ENCOUNTER — Ambulatory Visit (INDEPENDENT_AMBULATORY_CARE_PROVIDER_SITE_OTHER): Payer: 59 | Admitting: Internal Medicine

## 2017-08-29 ENCOUNTER — Encounter: Payer: Self-pay | Admitting: Internal Medicine

## 2017-08-29 VITALS — BP 132/98 | HR 93 | Temp 98.2°F | Wt 273.3 lb

## 2017-08-29 DIAGNOSIS — G8929 Other chronic pain: Secondary | ICD-10-CM

## 2017-08-29 DIAGNOSIS — M25531 Pain in right wrist: Secondary | ICD-10-CM

## 2017-08-29 DIAGNOSIS — M25331 Other instability, right wrist: Secondary | ICD-10-CM | POA: Diagnosis not present

## 2017-08-29 NOTE — Patient Instructions (Signed)
Plan   Referring to hand specialist   Dr Merlyn LotKuzma since he did   The hand surgery previously .  Continue slplinting for now .   Fu with CN about the DM .
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
## 2017-09-02 ENCOUNTER — Other Ambulatory Visit: Payer: Self-pay | Admitting: Adult Health

## 2017-09-02 ENCOUNTER — Encounter: Payer: Self-pay | Admitting: Adult Health

## 2017-09-02 DIAGNOSIS — E1165 Type 2 diabetes mellitus with hyperglycemia: Principal | ICD-10-CM

## 2017-09-02 DIAGNOSIS — IMO0001 Reserved for inherently not codable concepts without codable children: Secondary | ICD-10-CM

## 2017-09-03 MED ORDER — GLUCOSE BLOOD VI STRP: ORAL_STRIP | 3 refills | Status: DC

## 2017-09-11 ENCOUNTER — Other Ambulatory Visit: Payer: Self-pay | Admitting: Adult Health

## 2017-09-11 DIAGNOSIS — E1165 Type 2 diabetes mellitus with hyperglycemia: Principal | ICD-10-CM

## 2017-09-11 DIAGNOSIS — IMO0001 Reserved for inherently not codable concepts without codable children: Secondary | ICD-10-CM

## 2017-09-12 NOTE — Telephone Encounter (Signed)
Sent to the pharmacy by e-scribe. 

## 2017-09-15 DIAGNOSIS — M19131 Post-traumatic osteoarthritis, right wrist: Secondary | ICD-10-CM | POA: Diagnosis not present

## 2017-09-15 DIAGNOSIS — M654 Radial styloid tenosynovitis [de Quervain]: Secondary | ICD-10-CM | POA: Diagnosis not present

## 2017-10-13 DIAGNOSIS — G540 Brachial plexus disorders: Secondary | ICD-10-CM | POA: Diagnosis not present

## 2017-10-13 DIAGNOSIS — M5412 Radiculopathy, cervical region: Secondary | ICD-10-CM | POA: Diagnosis not present

## 2017-10-13 DIAGNOSIS — M50322 Other cervical disc degeneration at C5-C6 level: Secondary | ICD-10-CM | POA: Diagnosis not present

## 2017-10-15 ENCOUNTER — Ambulatory Visit (INDEPENDENT_AMBULATORY_CARE_PROVIDER_SITE_OTHER): Payer: 59 | Admitting: Adult Health

## 2017-10-15 ENCOUNTER — Other Ambulatory Visit: Payer: Self-pay | Admitting: *Deleted

## 2017-10-15 ENCOUNTER — Encounter: Payer: Self-pay | Admitting: Adult Health

## 2017-10-15 ENCOUNTER — Other Ambulatory Visit: Payer: Self-pay | Admitting: Adult Health

## 2017-10-15 VITALS — BP 120/90 | HR 83 | Temp 98.4°F | Ht 70.0 in | Wt 279.1 lb

## 2017-10-15 DIAGNOSIS — M5412 Radiculopathy, cervical region: Secondary | ICD-10-CM | POA: Diagnosis not present

## 2017-10-15 DIAGNOSIS — E1165 Type 2 diabetes mellitus with hyperglycemia: Secondary | ICD-10-CM

## 2017-10-15 DIAGNOSIS — Z114 Encounter for screening for human immunodeficiency virus [HIV]: Secondary | ICD-10-CM | POA: Diagnosis not present

## 2017-10-15 DIAGNOSIS — G540 Brachial plexus disorders: Secondary | ICD-10-CM | POA: Diagnosis not present

## 2017-10-15 DIAGNOSIS — J069 Acute upper respiratory infection, unspecified: Secondary | ICD-10-CM | POA: Diagnosis not present

## 2017-10-15 DIAGNOSIS — F17200 Nicotine dependence, unspecified, uncomplicated: Secondary | ICD-10-CM | POA: Diagnosis not present

## 2017-10-15 DIAGNOSIS — Z0000 Encounter for general adult medical examination without abnormal findings: Secondary | ICD-10-CM | POA: Diagnosis not present

## 2017-10-15 DIAGNOSIS — M50322 Other cervical disc degeneration at C5-C6 level: Secondary | ICD-10-CM | POA: Diagnosis not present

## 2017-10-15 DIAGNOSIS — IMO0001 Reserved for inherently not codable concepts without codable children: Secondary | ICD-10-CM

## 2017-10-15 LAB — HEPATIC FUNCTION PANEL
ALBUMIN: 4.1 g/dL (ref 3.5–5.2)
ALT: 17 U/L (ref 0–53)
AST: 13 U/L (ref 0–37)
Alkaline Phosphatase: 61 U/L (ref 39–117)
Bilirubin, Direct: 0.1 mg/dL (ref 0.0–0.3)
TOTAL PROTEIN: 6.9 g/dL (ref 6.0–8.3)
Total Bilirubin: 0.4 mg/dL (ref 0.2–1.2)

## 2017-10-15 LAB — BASIC METABOLIC PANEL
BUN: 15 mg/dL (ref 6–23)
CALCIUM: 9.3 mg/dL (ref 8.4–10.5)
CO2: 30 meq/L (ref 19–32)
Chloride: 103 mEq/L (ref 96–112)
Creatinine, Ser: 1.15 mg/dL (ref 0.40–1.50)
GFR: 91.83 mL/min (ref >60.00)
GLUCOSE: 152 mg/dL — AB (ref 70–99)
POTASSIUM: 4.4 meq/L (ref 3.5–5.1)
SODIUM: 138 meq/L (ref 135–145)

## 2017-10-15 LAB — LIPID PANEL
Cholesterol: 193 mg/dL (ref 0–200)
HDL: 41.9 mg/dL (ref >39.00)
LDL Cholesterol: 129 mg/dL — ABNORMAL HIGH (ref 0–99)
NonHDL: 150.92
TRIGLYCERIDES: 111 mg/dL (ref 0.0–149.0)
Total CHOL/HDL Ratio: 5
VLDL: 22.2 mg/dL (ref 0.0–40.0)

## 2017-10-15 LAB — CBC WITH DIFFERENTIAL/PLATELET
Basophils Absolute: 0 10*3/uL (ref 0.0–0.1)
Basophils Relative: 0.4 % (ref 0.0–3.0)
EOS PCT: 6.2 % — AB (ref 0.0–5.0)
Eosinophils Absolute: 0.4 10*3/uL (ref 0.0–0.7)
HCT: 45 % (ref 39.0–52.0)
Hemoglobin: 14.9 g/dL (ref 13.0–17.0)
LYMPHS ABS: 1.3 10*3/uL (ref 0.7–4.0)
Lymphocytes Relative: 20.4 % (ref 12.0–46.0)
MCHC: 33 g/dL (ref 30.0–36.0)
MCV: 89.8 fl (ref 78.0–100.0)
MONOS PCT: 6.4 % (ref 3.0–12.0)
Monocytes Absolute: 0.4 10*3/uL (ref 0.1–1.0)
NEUTROS ABS: 4.4 10*3/uL (ref 1.4–7.7)
NEUTROS PCT: 66.6 % (ref 43.0–77.0)
PLATELETS: 364 10*3/uL (ref 150.0–400.0)
RBC: 5.01 Mil/uL (ref 4.22–5.81)
RDW: 13.7 % (ref 11.5–15.5)
WBC: 6.6 10*3/uL (ref 4.0–10.5)

## 2017-10-15 LAB — MICROALBUMIN / CREATININE URINE RATIO
CREATININE, U: 152 mg/dL
MICROALB/CREAT RATIO: 0.5 mg/g (ref 0.0–30.0)
Microalb, Ur: <0.7 mg/dL (ref 0.0–1.9)

## 2017-10-15 LAB — TSH: TSH: 1.57 u[IU]/mL (ref 0.35–4.50)

## 2017-10-15 LAB — HEMOGLOBIN A1C: Hgb A1c MFr Bld: 7.1 % — ABNORMAL HIGH (ref 4.6–6.5)

## 2017-10-15 MED ORDER — GLIPIZIDE ER 10 MG PO TB24: ORAL_TABLET | ORAL | 0 refills | Status: DC

## 2017-10-15 MED ORDER — VARENICLINE TARTRATE 0.5 MG X 11 & 1 MG X 42 PO MISC: ORAL | 0 refills | Status: DC

## 2017-10-15 MED ORDER — SIMVASTATIN 5 MG PO TABS: 5.0000 mg | ORAL_TABLET | Freq: Every day | ORAL | 0 refills | Status: DC

## 2017-10-15 MED ORDER — METFORMIN HCL 500 MG PO TABS: ORAL_TABLET | ORAL | 0 refills | Status: DC

## 2017-10-15 MED ORDER — AZITHROMYCIN 250 MG PO TABS: ORAL_TABLET | ORAL | 0 refills | Status: DC

## 2017-10-15 NOTE — Patient Instructions (Signed)
It was great seeing you today   I have prescribed Chantix to help you quit smoking   Please start reading about diabetes. You have to own the disease to be able to manage it.   Please follo wup in three months for diabetic check

## 2017-10-15 NOTE — Progress Notes (Signed)
Subjective:    Patient ID: Joseph Barry, male    DOB: 12-09-1980, 37 y.o.   MRN: [id number]  HPI Patient presents for yearly preventative medicine examination. He is a pleasant 37 year old male who  has a past medical history of Depression, Diabetes mellitus type II, uncontrolled (HCC), Eczema, Finger laceration involving tendon (05/20/2014), GERD (gastroesophageal reflux disease), History of cardiac murmur as a child, History of MRSA infection (~ 2007), Hypertension, Migraines, and Sleep apnea.  DM - Currently prescribed Glipizide 10 mg extended release and metformin 500 mg twice daily.  He was seen in the ER on 04/23/2017 for hyperglycemia which time he was found to have an A1c of 12.  His most recent A1c in July was 7.6. Today he reports that he is not exercising and has not been eating healthy. He eats fast food a lot at lunch.   Tobacco Use - Continues to smoke about 4-5 cigars a day. He is wanting to quit   Acute Issues - He reports sinus pain and pressure with productive cough for the last 2-3 days. Denies fevers. Does feel ill. No nausea or vomiting.   All immunizations and health maintenance protocols were reviewed with the patient and needed orders were placed.  He is due for yearly flu vaccination as well as Pneumovax 23 due to history of diabetes  Appropriate screening laboratory values were ordered for the patient including screening of hyperlipidemia, renal function and hepatic function.  Medication reconciliation,  past medical history, social history, problem list and allergies were reviewed in detail with the patient  Goals were established with regard to weight loss, exercise, and  diet in compliance with medications  Wt Readings from Last 3 Encounters:  10/15/17 279 lb 2 oz (126.6 kg)  08/29/17 273 lb 4.8 oz (124 kg)  07/15/17 277 lb (125.6 kg)    Review of Systems  Constitutional: Negative.   HENT: Positive for sinus pressure and sinus pain.   Eyes: Negative.     Respiratory: Positive for cough and chest tightness.   Cardiovascular: Negative.   Gastrointestinal: Negative.   Endocrine: Negative.   Genitourinary: Negative.   Musculoskeletal: Negative.   Skin: Negative.   Allergic/Immunologic: Negative.   Neurological: Negative.   Hematological: Negative.   Psychiatric/Behavioral: Negative.   All other systems reviewed and are negative.  Past Medical History:  Diagnosis Date  . Depression   . Diabetes mellitus type II, uncontrolled (HCC)   . Eczema   . Finger laceration involving tendon 05/20/2014   right index, long, ring, small fingers - tendon/artery/nerve lac.  Marland Kitchen GERD (gastroesophageal reflux disease)   . History of cardiac murmur as a child   . History of MRSA infection ~ 2007   back  . Hypertension    states under control with med., has been on med. x 1 yr.  . Migraines   . Sleep apnea    uses CPAP nightly    Social History   Socioeconomic History  . Marital status: Married    Spouse name: Not on file  . Number of children: Not on file  . Years of education: Not on file  . Highest education level: Not on file  Occupational History  . Not on file  Social Needs  . Financial resource strain: Not on file  . Food insecurity:    Worry: Not on file    Inability: Not on file  . Transportation needs:    Medical: Not on file    Non-medical:  Not on file  Tobacco Use  . Smoking status: Current Every Day Smoker    Years: 5.00    Types: Cigars  . Smokeless tobacco: Never Used  . Tobacco comment: 5 cigars/day  Substance and Sexual Activity  . Alcohol use: Yes    Alcohol/week: 0.0 standard drinks    Comment: occasionally  . Drug use: No  . Sexual activity: Not on file  Lifestyle  . Physical activity:    Days per week: Not on file    Minutes per session: Not on file  . Stress: Not on file  Relationships  . Social connections:    Talks on phone: Not on file    Gets together: Not on file    Attends religious service: Not  on file    Active member of club or organization: Not on file    Attends meetings of clubs or organizations: Not on file    Relationship status: Not on file  . Intimate partner violence:    Fear of current or ex partner: Not on file    Emotionally abused: Not on file    Physically abused: Not on file    Forced sexual activity: Not on file  Other Topics Concern  . 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    Married for five years   Has step daughter.    Has rabbit and dog.     Past Surgical History:  Procedure Laterality Date  . NERVE, TENDON AND ARTERY REPAIR Right 05/24/2014   Procedure: RIGHT INDEX LONG RING SMALL/REPAIR TENDON, ARTERY NERVE;  Surgeon: Betha Loa, MD;  Location: [facility];  Service: Orthopedics;  Laterality: Right;  . ORBITAL FRACTURE SURGERY Left 2003    Family History  Problem Relation Age of Onset  . Arthritis Maternal Grandmother   . Diabetes Maternal Grandmother   . Heart disease Maternal Grandmother   . Hypertension Father   . Diabetes Father   . Colon polyps Father   . Hypertension Mother   . Heart disease Mother   . COPD Mother   . Cancer Paternal Grandmother        spine cancer  . Hypertension Sister     Allergies  Allergen Reactions  . Shellfish Allergy Shortness Of Breath and Swelling    Current Outpatient Medications on File Prior to Visit  Medication Sig Dispense Refill  . diclofenac (VOLTAREN) 75 MG EC tablet Take 1 tablet by mouth 2 (two) times daily.    Marland Kitchen glipiZIDE (GLUCOTROL XL) 10 MG 24 hr tablet TAKE 1 TABLET(10 MG) BY MOUTH DAILY WITH BREAKFAST 60 tablet 0  . glucose blood (ONETOUCH VERIO) test strip USE TO TEST BLOOD GLUCOSE THREE TIMES DAILY. 300 each 3  . metFORMIN (GLUCOPHAGE) 500 MG tablet TAKE 1 TABLET(500 MG) BY MOUTH TWICE DAILY WITH A MEAL 180 tablet 0  . ONETOUCH DELICA LANCETS FINE MISC Use as directed three times per day 100 each 12   No  current facility-administered medications on file prior to visit.     BP 120/90 (BP Location: Left Arm, Patient Position: Sitting, Cuff Size: Large)   Pulse 83   Temp 98.4 F (36.9 C) (Oral)   Ht 5\' 10"  (1.778 m)   Wt 279 lb 2 oz (126.6 kg)   SpO2 96%   BMI 40.05 kg/m       Objective:   Physical Exam  Constitutional: He is oriented to person, place,  and time. He appears well-developed and well-nourished. No distress.  Obese   HENT:  Head: Normocephalic and atraumatic.  Right Ear: Hearing, tympanic membrane, external ear and ear canal normal.  Left Ear: Hearing, tympanic membrane, external ear and ear canal normal.  Nose: Mucosal edema and rhinorrhea present. Right sinus exhibits maxillary sinus tenderness. Right sinus exhibits no frontal sinus tenderness. Left sinus exhibits maxillary sinus tenderness. Left sinus exhibits no frontal sinus tenderness.  Mouth/Throat: Oropharynx is clear and moist. No oropharyngeal exudate.  Eyes: Pupils are equal, round, and reactive to light. Conjunctivae and EOM are normal. Right eye exhibits no discharge. Left eye exhibits no discharge. No scleral icterus.  Neck: Normal range of motion. Neck supple. No JVD present. No tracheal deviation present. No thyromegaly present.  Cardiovascular: Normal rate, regular rhythm, normal heart sounds and intact distal pulses. Exam reveals no gallop and no friction rub.  No murmur heard. Pulmonary/Chest: Effort normal and breath sounds normal. No stridor. No respiratory distress. He has no wheezes. He has no rales. He exhibits no tenderness.  Abdominal: Soft. Bowel sounds are normal. He exhibits no distension and no mass. There is no tenderness. There is no rebound and no guarding. No hernia.  Musculoskeletal: Normal range of motion. He exhibits no edema, tenderness or deformity.  Lymphadenopathy:    He has no cervical adenopathy.  Neurological: He is alert and oriented to person, place, and time. He displays  normal reflexes. No cranial nerve deficit or sensory deficit. He exhibits normal muscle tone. Coordination normal.  Skin: Skin is warm and dry. Capillary refill takes less than 2 seconds. No rash noted. He is not diaphoretic. No erythema. No pallor.  Psychiatric: He has a normal mood and affect. His behavior is normal. Judgment and thought content normal.  Nursing note and vitals reviewed.     Assessment & Plan:  1. Routine general medical examination at a health care facility - Will hold off on vaccinations today due to being ill.  - Needs to lose weight  - Needs to quit smoking.  - Basic metabolic panel - CBC with Differential/Platelet - Hemoglobin A1c - Hepatic function panel - Lipid panel - TSH  2. Uncontrolled type 2 diabetes mellitus with hyperglycemia (HCC) - He needs to be more compliant with medication therapy and work on diet and exercise to help him lose weight  - Consider increase in Metformin - Follow up in 2-3 days or sooner if needed - Basic metabolic panel - CBC with Differential/Platelet - Hemoglobin A1c - Hepatic function panel - Lipid panel - TSH - Microalbumin / creatinine urine ratio  3. Tobacco use disorder  - varenicline (CHANTIX STARTING MONTH PAK) 0.5 MG X 11 & 1 MG X 42 tablet; Take one 0.5 mg tablet by mouth once daily for 3 days, then increase to one 0.5 mg tablet twice daily for 4 days, then increase to one 1 mg tablet twice daily.  Dispense: 53 tablet; Refill: 0 Trial of chantix. Common side effects including rare risk of suicide ideation was discussed with the patient today.  Patient is instructed to go directly to the ED if this occurs.  We discussed that patient can continue to smoke for 1 week after starting chantix, but then must discontinue cigarettes.  He is also instructed to contact us prior to completion of the starter month pack for an rx for the continuation month pack.  5 minutes spent with patient today on tobacco cessation counseling.     4. Encounter for  screening for HIV  - HIV Antibody (routine testing w rflx)  5. Upper respiratory tract infection, unspecified type  - azithromycin (ZITHROMAX Z-PAK) 250 MG tablet; Take 2 tablets on Day 1.  Then take 1 tablet daily.  Dispense: 6 tablet; Refill: 0  Shirline Frees, NP

## 2017-10-16 LAB — HIV ANTIBODY (ROUTINE TESTING W REFLEX): HIV: NONREACTIVE

## 2017-10-17 DIAGNOSIS — M50322 Other cervical disc degeneration at C5-C6 level: Secondary | ICD-10-CM | POA: Diagnosis not present

## 2017-10-17 DIAGNOSIS — G540 Brachial plexus disorders: Secondary | ICD-10-CM | POA: Diagnosis not present

## 2017-10-17 DIAGNOSIS — M5412 Radiculopathy, cervical region: Secondary | ICD-10-CM | POA: Diagnosis not present

## 2017-10-20 DIAGNOSIS — G540 Brachial plexus disorders: Secondary | ICD-10-CM | POA: Diagnosis not present

## 2017-10-20 DIAGNOSIS — M5412 Radiculopathy, cervical region: Secondary | ICD-10-CM | POA: Diagnosis not present

## 2017-10-20 DIAGNOSIS — M50322 Other cervical disc degeneration at C5-C6 level: Secondary | ICD-10-CM | POA: Diagnosis not present

## 2017-10-22 DIAGNOSIS — M50322 Other cervical disc degeneration at C5-C6 level: Secondary | ICD-10-CM | POA: Diagnosis not present

## 2017-10-22 DIAGNOSIS — M5412 Radiculopathy, cervical region: Secondary | ICD-10-CM | POA: Diagnosis not present

## 2017-10-22 DIAGNOSIS — G540 Brachial plexus disorders: Secondary | ICD-10-CM | POA: Diagnosis not present

## 2017-10-27 DIAGNOSIS — M5412 Radiculopathy, cervical region: Secondary | ICD-10-CM | POA: Diagnosis not present

## 2017-10-27 DIAGNOSIS — G540 Brachial plexus disorders: Secondary | ICD-10-CM | POA: Diagnosis not present

## 2017-10-27 DIAGNOSIS — M50322 Other cervical disc degeneration at C5-C6 level: Secondary | ICD-10-CM | POA: Diagnosis not present

## 2017-10-29 DIAGNOSIS — M19131 Post-traumatic osteoarthritis, right wrist: Secondary | ICD-10-CM | POA: Diagnosis not present

## 2017-11-05 DIAGNOSIS — M5412 Radiculopathy, cervical region: Secondary | ICD-10-CM | POA: Diagnosis not present

## 2017-11-05 DIAGNOSIS — M50322 Other cervical disc degeneration at C5-C6 level: Secondary | ICD-10-CM | POA: Diagnosis not present

## 2017-11-05 DIAGNOSIS — G540 Brachial plexus disorders: Secondary | ICD-10-CM | POA: Diagnosis not present

## 2017-11-07 DIAGNOSIS — G540 Brachial plexus disorders: Secondary | ICD-10-CM | POA: Diagnosis not present

## 2017-11-07 DIAGNOSIS — M5412 Radiculopathy, cervical region: Secondary | ICD-10-CM | POA: Diagnosis not present

## 2017-11-07 DIAGNOSIS — M50322 Other cervical disc degeneration at C5-C6 level: Secondary | ICD-10-CM | POA: Diagnosis not present

## 2017-11-11 DIAGNOSIS — M50322 Other cervical disc degeneration at C5-C6 level: Secondary | ICD-10-CM | POA: Diagnosis not present

## 2017-11-11 DIAGNOSIS — M5412 Radiculopathy, cervical region: Secondary | ICD-10-CM | POA: Diagnosis not present

## 2017-11-11 DIAGNOSIS — G540 Brachial plexus disorders: Secondary | ICD-10-CM | POA: Diagnosis not present

## 2017-11-13 DIAGNOSIS — M5412 Radiculopathy, cervical region: Secondary | ICD-10-CM | POA: Diagnosis not present

## 2017-11-13 DIAGNOSIS — G540 Brachial plexus disorders: Secondary | ICD-10-CM | POA: Diagnosis not present

## 2017-11-13 DIAGNOSIS — M50322 Other cervical disc degeneration at C5-C6 level: Secondary | ICD-10-CM | POA: Diagnosis not present

## 2017-11-17 DIAGNOSIS — G540 Brachial plexus disorders: Secondary | ICD-10-CM | POA: Diagnosis not present

## 2017-11-17 DIAGNOSIS — M50322 Other cervical disc degeneration at C5-C6 level: Secondary | ICD-10-CM | POA: Diagnosis not present

## 2017-11-17 DIAGNOSIS — M5412 Radiculopathy, cervical region: Secondary | ICD-10-CM | POA: Diagnosis not present

## 2017-11-20 DIAGNOSIS — M5412 Radiculopathy, cervical region: Secondary | ICD-10-CM | POA: Diagnosis not present

## 2017-11-20 DIAGNOSIS — G540 Brachial plexus disorders: Secondary | ICD-10-CM | POA: Diagnosis not present

## 2017-11-20 DIAGNOSIS — M50322 Other cervical disc degeneration at C5-C6 level: Secondary | ICD-10-CM | POA: Diagnosis not present

## 2017-11-24 DIAGNOSIS — M5412 Radiculopathy, cervical region: Secondary | ICD-10-CM | POA: Diagnosis not present

## 2017-11-24 DIAGNOSIS — G540 Brachial plexus disorders: Secondary | ICD-10-CM | POA: Diagnosis not present

## 2017-11-24 DIAGNOSIS — M50322 Other cervical disc degeneration at C5-C6 level: Secondary | ICD-10-CM | POA: Diagnosis not present

## 2017-11-26 DIAGNOSIS — M5412 Radiculopathy, cervical region: Secondary | ICD-10-CM | POA: Diagnosis not present

## 2017-11-26 DIAGNOSIS — M50322 Other cervical disc degeneration at C5-C6 level: Secondary | ICD-10-CM | POA: Diagnosis not present

## 2017-11-26 DIAGNOSIS — G540 Brachial plexus disorders: Secondary | ICD-10-CM | POA: Diagnosis not present

## 2017-12-01 DIAGNOSIS — M50322 Other cervical disc degeneration at C5-C6 level: Secondary | ICD-10-CM | POA: Diagnosis not present

## 2017-12-01 DIAGNOSIS — M5412 Radiculopathy, cervical region: Secondary | ICD-10-CM | POA: Diagnosis not present

## 2017-12-01 DIAGNOSIS — G540 Brachial plexus disorders: Secondary | ICD-10-CM | POA: Diagnosis not present

## 2017-12-02 DIAGNOSIS — M5412 Radiculopathy, cervical region: Secondary | ICD-10-CM | POA: Diagnosis not present

## 2017-12-02 DIAGNOSIS — G540 Brachial plexus disorders: Secondary | ICD-10-CM | POA: Diagnosis not present

## 2017-12-02 DIAGNOSIS — M50322 Other cervical disc degeneration at C5-C6 level: Secondary | ICD-10-CM | POA: Diagnosis not present

## 2017-12-08 DIAGNOSIS — G540 Brachial plexus disorders: Secondary | ICD-10-CM | POA: Diagnosis not present

## 2017-12-08 DIAGNOSIS — M50322 Other cervical disc degeneration at C5-C6 level: Secondary | ICD-10-CM | POA: Diagnosis not present

## 2017-12-08 DIAGNOSIS — M5412 Radiculopathy, cervical region: Secondary | ICD-10-CM | POA: Diagnosis not present

## 2017-12-12 ENCOUNTER — Other Ambulatory Visit: Payer: Self-pay | Admitting: Adult Health

## 2017-12-12 DIAGNOSIS — IMO0001 Reserved for inherently not codable concepts without codable children: Secondary | ICD-10-CM

## 2017-12-12 DIAGNOSIS — E1165 Type 2 diabetes mellitus with hyperglycemia: Principal | ICD-10-CM

## 2017-12-12 NOTE — Telephone Encounter (Signed)
Denied.  PRESCRIPTION FILLED ON 10/15/17 FOR 90 DAYS.

## 2018-01-11 ENCOUNTER — Other Ambulatory Visit: Payer: Self-pay | Admitting: Adult Health

## 2018-01-11 DIAGNOSIS — E1165 Type 2 diabetes mellitus with hyperglycemia: Principal | ICD-10-CM

## 2018-01-11 DIAGNOSIS — IMO0001 Reserved for inherently not codable concepts without codable children: Secondary | ICD-10-CM

## 2018-01-13 NOTE — Telephone Encounter (Signed)
Sent to the pharmacy by e-scribe.  Pt has appt on 01/13/17.

## 2018-01-15 ENCOUNTER — Encounter: Payer: Self-pay | Admitting: Adult Health

## 2018-01-15 ENCOUNTER — Ambulatory Visit (INDEPENDENT_AMBULATORY_CARE_PROVIDER_SITE_OTHER): Payer: 59 | Admitting: Adult Health

## 2018-01-15 VITALS — BP 130/82 | Temp 98.6°F | Wt 282.0 lb

## 2018-01-15 DIAGNOSIS — E1165 Type 2 diabetes mellitus with hyperglycemia: Secondary | ICD-10-CM | POA: Diagnosis not present

## 2018-01-15 DIAGNOSIS — F17200 Nicotine dependence, unspecified, uncomplicated: Secondary | ICD-10-CM

## 2018-01-15 DIAGNOSIS — IMO0001 Reserved for inherently not codable concepts without codable children: Secondary | ICD-10-CM

## 2018-01-15 LAB — POCT GLYCOSYLATED HEMOGLOBIN (HGB A1C): HbA1c, POC (controlled diabetic range): 8.2 % — AB (ref 0.0–7.0)

## 2018-01-15 MED ORDER — VARENICLINE TARTRATE 1 MG PO TABS: 1.0000 mg | ORAL_TABLET | Freq: Two times a day (BID) | ORAL | 3 refills | Status: AC

## 2018-01-15 MED ORDER — METFORMIN HCL 500 MG PO TABS: ORAL_TABLET | ORAL | 0 refills | Status: DC

## 2018-01-15 NOTE — Progress Notes (Signed)
Subjective:    Patient ID: Joseph Barry, male    DOB: [birth year], 38 y.o.   MRN: [id number]  HPI  38 year old male who  has a past medical history of Depression, Diabetes mellitus type II, uncontrolled (HCC), Eczema, Finger laceration involving tendon (05/20/2014), GERD (gastroesophageal reflux disease), History of cardiac murmur as a child, History of MRSA infection (~ 2007), Hypertension, Migraines, and Sleep apnea.  He presents to the office today for follow up regarding DM. He is currently prescribed glipizide 10 mg XR and Metformin 500 mg BID. He has not been eating healthy and is exercising very infrequently. Additionally, he is not taking his medication on a daily basis, he often forgets.   He does monitor his blood sugars at home and has been getting readings in the 180-200  His last A1c was 7.1   He does report that he has cut back on smoking while using chantix. He is down to 2-3 cigarettes per day. He would like to continue on chantix    Wt Readings from Last 3 Encounters:  01/15/18 282 lb (127.9 kg)  10/15/17 279 lb 2 oz (126.6 kg)  08/29/17 273 lb 4.8 oz (124 kg)     Review of Systems See HPI   Past Medical History:  Diagnosis Date  . Depression   . Diabetes mellitus type II, uncontrolled (HCC)   . Eczema   . Finger laceration involving tendon 05/20/2014   right index, long, ring, small fingers - tendon/artery/nerve lac.  Marland Kitchen GERD (gastroesophageal reflux disease)   . History of cardiac murmur as a child   . History of MRSA infection ~ 2007   back  . Hypertension    states under control with med., has been on med. x 1 yr.  . Migraines   . Sleep apnea    uses CPAP nightly    Social History   Socioeconomic History  . Marital status: Married    Spouse name: Not on file  . Number of children: Not on file  . Years of education: Not on file  . Highest education level: Not on file  Occupational History  . Not on file  Social Needs  . Financial resource  strain: Not on file  . Food insecurity:    Worry: Not on file    Inability: Not on file  . Transportation needs:    Medical: Not on file    Non-medical: Not on file  Tobacco Use  . Smoking status: Current Every Day Smoker    Years: 5.00    Types: Cigars  . Smokeless tobacco: Never Used  . Tobacco comment: 5 cigars/day  Substance and Sexual Activity  . Alcohol use: Yes    Alcohol/week: 0.0 standard drinks    Comment: occasionally  . Drug use: No  . Sexual activity: Not on file  Lifestyle  . Physical activity:    Days per week: Not on file    Minutes per session: Not on file  . Stress: Not on file  Relationships  . Social connections:    Talks on phone: Not on file    Gets together: Not on file    Attends religious service: Not on file    Active member of club or organization: Not on file    Attends meetings of clubs or organizations: Not on file    Relationship status: Not on file  . Intimate partner violence:    Fear of current or ex partner: Not on file  Emotionally abused: Not on file    Physically abused: Not on file    Forced sexual activity: Not on file  Other Topics Concern  . Not on file  Social History Narrative   Going to school for [employer]. Starts in August.    Married for five years   Has step daughter.    Has rabbit and dog.     Past Surgical History:  Procedure Laterality Date  . NERVE, TENDON AND ARTERY REPAIR Right 05/24/2014   Procedure: RIGHT INDEX LONG RING SMALL/REPAIR TENDON, ARTERY NERVE;  Surgeon: Betha LoaKevin Kuzma, MD;  Location: [facility];  Service: Orthopedics;  Laterality: Right;  . ORBITAL FRACTURE SURGERY Left 2003    Family History  Problem Relation Age of Onset  . Arthritis Maternal Grandmother   . Diabetes Maternal Grandmother   . Heart disease Maternal Grandmother   . Hypertension Father   . Diabetes Father   . Colon polyps Father   . Hypertension Mother   . Heart disease Mother   . COPD Mother     . Cancer Paternal Grandmother        spine cancer  . Hypertension Sister     Allergies  Allergen Reactions  . Shellfish Allergy Shortness Of Breath and Swelling    Current Outpatient Medications on File Prior to Visit  Medication Sig Dispense Refill  . glipiZIDE (GLUCOTROL XL) 10 MG 24 hr tablet TAKE 1 TABLET BY MOUTH  DAILY WITH BREAKFAST 90 tablet 0  . glucose blood (ONETOUCH VERIO) test strip USE TO TEST BLOOD GLUCOSE THREE TIMES DAILY. 300 each 3  . ONETOUCH DELICA LANCETS FINE MISC Use as directed three times per day 100 each 12  . simvastatin (ZOCOR) 5 MG tablet Take 1 tablet (5 mg total) by mouth daily. 90 tablet 0   No current facility-administered medications on file prior to visit.     BP 130/82   Temp 98.6 F (37 C)   Wt 282 lb (127.9 kg)   BMI 40.46 kg/m       Objective:   Physical Exam Vitals signs and nursing note reviewed.  Constitutional:      Appearance: Normal appearance.  Cardiovascular:     Rate and Rhythm: Normal rate and regular rhythm.     Pulses: Normal pulses.     Heart sounds: Normal heart sounds.  Pulmonary:     Effort: Pulmonary effort is normal.     Breath sounds: Normal breath sounds.  Skin:    General: Skin is warm and dry.  Neurological:     General: No focal deficit present.     Mental Status: He is alert and oriented to person, place, and time.  Psychiatric:        Mood and Affect: Mood normal.        Behavior: Behavior normal.        Thought Content: Thought content normal.        Judgment: Judgment normal.       Assessment & Plan:  1. Uncontrolled type 2 diabetes mellitus without complication, without long-term current use of insulin (HCC)  - POCT A1C- 8.2  - Recommend to add medication but he would like to work on his diet, meal planning, exercise and actually taking his medications on a daily basis.  - metFORMIN (GLUCOPHAGE) 500 MG tablet; TAKE 1 TABLET(500 MG) BY MOUTH TWICE DAILY WITH A MEAL  Dispense: 180 tablet;  Refill: 0 - Follow up in 3 months or sooner if  needed 2. Tobacco use disorder  - varenicline (CHANTIX CONTINUING MONTH PAK) 1 MG tablet; Take 1 tablet (1 mg total) by mouth 2 (two) times daily.  Dispense: 60 tablet; Refill: 3  Shirline Freesory Marquise Lambson, NP

## 2018-02-19 ENCOUNTER — Other Ambulatory Visit: Payer: Self-pay | Admitting: Family Medicine

## 2018-02-19 ENCOUNTER — Telehealth: Payer: Self-pay

## 2018-02-19 DIAGNOSIS — IMO0001 Reserved for inherently not codable concepts without codable children: Secondary | ICD-10-CM

## 2018-02-19 DIAGNOSIS — E1165 Type 2 diabetes mellitus with hyperglycemia: Principal | ICD-10-CM

## 2018-02-19 MED ORDER — GLIPIZIDE ER 10 MG PO TB24: ORAL_TABLET | ORAL | 0 refills | Status: DC

## 2018-02-19 MED ORDER — SIMVASTATIN 5 MG PO TABS: 5.0000 mg | ORAL_TABLET | Freq: Every day | ORAL | 2 refills | Status: DC

## 2018-02-19 NOTE — Telephone Encounter (Signed)
Sent to the pharmacy by e-scribe. 

## 2018-02-19 NOTE — Telephone Encounter (Signed)
Copied from CRM [id number]. Topic: General - Other >> Feb 19, 2018 12:00 PM Jilda Roche wrote: Reason for CRM: John from [employer] needs a verbal   Please call at [phone]  [phone]

## 2018-02-20 ENCOUNTER — Telehealth: Payer: Self-pay | Admitting: Adult Health

## 2018-02-20 NOTE — Telephone Encounter (Signed)
Copied from CRM [id number]. Topic: Quick Communication - Rx Refill/Question >> Feb 20, 2018 12:14 PM Fanny Bien wrote: Medication:simvastatin (ZOCOR) 5 MG tablet [[id number]] glipiZIDE (GLUCOTROL XL) 10 MG 24 hr tablet [[id number]] varenicline (CHANTIX CONTINUING MONTH PAK) 1 MG tablet [[id number]]  needs starter pack as well. Pharmacy called and stated that the did not receive any of these medications   Has the patient contacted their pharmacy?no Preferred Pharmacy (with phone number or street name):[facility] - [place], [place] - [address] [phone] (Phone) [phone] (Fax)    Agent: Please be advised that RX refills may take up to 3 business days. We ask that you follow-up with your pharmacy.

## 2018-02-20 NOTE — Telephone Encounter (Signed)
Informed pharmacist that both simvastatin and glipizide were sent in electronically. Pt no longer on Chantix.  No further action required.

## 2018-02-20 NOTE — Telephone Encounter (Signed)
Duplicate message. 

## 2018-04-08 ENCOUNTER — Other Ambulatory Visit: Payer: Self-pay | Admitting: Adult Health

## 2018-04-08 DIAGNOSIS — E1165 Type 2 diabetes mellitus with hyperglycemia: Principal | ICD-10-CM

## 2018-04-08 DIAGNOSIS — IMO0001 Reserved for inherently not codable concepts without codable children: Secondary | ICD-10-CM

## 2018-04-08 NOTE — Telephone Encounter (Signed)
Left a message for a return call.  Pt needs lab appt and to be set up for Webex.

## 2018-04-09 NOTE — Telephone Encounter (Signed)
Left a message for a return call.

## 2018-04-13 NOTE — Telephone Encounter (Signed)
Left a message for a return call.  Have tried to reach the pt multiple times.  Will now deny meds and close note.

## 2018-04-14 ENCOUNTER — Other Ambulatory Visit: Payer: Self-pay | Admitting: Family Medicine

## 2018-04-14 DIAGNOSIS — IMO0001 Reserved for inherently not codable concepts without codable children: Secondary | ICD-10-CM

## 2018-04-14 DIAGNOSIS — E1165 Type 2 diabetes mellitus with hyperglycemia: Principal | ICD-10-CM

## 2018-04-14 MED ORDER — GLUCOSE BLOOD VI STRP: ORAL_STRIP | 0 refills | Status: DC

## 2018-04-14 MED ORDER — SIMVASTATIN 5 MG PO TABS: 5.0000 mg | ORAL_TABLET | Freq: Every day | ORAL | 0 refills | Status: DC

## 2018-04-14 MED ORDER — ONETOUCH DELICA LANCETS 30G MISC: 1.0000 | Freq: Three times a day (TID) | 0 refills | Status: DC

## 2018-04-14 MED ORDER — GLIPIZIDE ER 10 MG PO TB24: ORAL_TABLET | ORAL | 0 refills | Status: DC

## 2018-04-14 MED ORDER — METFORMIN HCL 500 MG PO TABS: ORAL_TABLET | ORAL | 0 refills | Status: DC

## 2018-04-14 NOTE — Addendum Note (Signed)
Addended by: Raj Janus T on: 04/14/2018 10:53 AM   Modules accepted: Orders

## 2018-04-14 NOTE — Telephone Encounter (Signed)
Sent to the pharmacy by e-scribe. 

## 2018-04-15 ENCOUNTER — Other Ambulatory Visit: Payer: Self-pay

## 2018-04-15 ENCOUNTER — Other Ambulatory Visit (INDEPENDENT_AMBULATORY_CARE_PROVIDER_SITE_OTHER): Payer: 59

## 2018-04-15 DIAGNOSIS — IMO0001 Reserved for inherently not codable concepts without codable children: Secondary | ICD-10-CM

## 2018-04-15 DIAGNOSIS — E1165 Type 2 diabetes mellitus with hyperglycemia: Secondary | ICD-10-CM

## 2018-04-15 LAB — HEMOGLOBIN A1C: Hgb A1c MFr Bld: 8.4 % — ABNORMAL HIGH (ref 4.6–6.5)

## 2018-04-16 ENCOUNTER — Ambulatory Visit (INDEPENDENT_AMBULATORY_CARE_PROVIDER_SITE_OTHER): Payer: 59 | Admitting: Adult Health

## 2018-04-16 ENCOUNTER — Encounter: Payer: Self-pay | Admitting: Adult Health

## 2018-04-16 ENCOUNTER — Other Ambulatory Visit: Payer: Self-pay

## 2018-04-16 DIAGNOSIS — E1165 Type 2 diabetes mellitus with hyperglycemia: Secondary | ICD-10-CM | POA: Diagnosis not present

## 2018-04-16 NOTE — Progress Notes (Signed)
Virtual Visit via Video Note  I connected with@ on 04/16/18 at  2:45 PM EDT by a video enabled telemedicine application and verified that I am speaking with the correct person using two identifiers.  Location patient: home Location provider:work or home office Persons participating in the virtual visit: patient, provider  I discussed the limitations of evaluation and management by telemedicine and the availability of in person appointments. The patient expressed understanding and agreed to proceed.   HPI:  38 year old male who presents for evaluation today for follow-up regarding diabetes mellitus.  He is currently prescribed glipizide 10 mg extended release and metformin 500 mg twice daily.  Was last seen in January 2020 at which time his A1c had increased from 7.1-8.2.  Was recommended starting another agent to help with his diabetes control but he refused wanting to work on diet and exercise.  Today he reports that he has not been taking the medications as directed, he often forgets to take either all of his medications or takes them in the morning and not at nighttime.  He is also not exercising.  He is trying to eat healthier.   ROS: See pertinent positives and negatives per HPI.  Past Medical History:  Diagnosis Date  . Depression   . Diabetes mellitus type II, uncontrolled (HCC)   . Eczema   . Finger laceration involving tendon 05/20/2014   right index, long, ring, small fingers - tendon/artery/nerve lac.  Marland Kitchen GERD (gastroesophageal reflux disease)   . History of cardiac murmur as a child   . History of MRSA infection ~ 2007   back  . Hypertension    states under control with med., has been on med. x 1 yr.  . Migraines   . Sleep apnea    uses CPAP nightly    Past Surgical History:  Procedure Laterality Date  . NERVE, TENDON AND ARTERY REPAIR Right 05/24/2014   Procedure: RIGHT INDEX LONG RING SMALL/REPAIR TENDON, ARTERY NERVE;  Surgeon: Betha Loa, MD;  Location: [facility];  Service: Orthopedics;  Laterality: Right;  . ORBITAL FRACTURE SURGERY Left 2003    Family History  Problem Relation Age of Onset  . Arthritis Maternal Grandmother   . Diabetes Maternal Grandmother   . Heart disease Maternal Grandmother   . Hypertension Father   . Diabetes Father   . Colon polyps Father   . Hypertension Mother   . Heart disease Mother   . COPD Mother   . Cancer Paternal Grandmother        spine cancer  . Hypertension Sister        Current Outpatient Medications:  .  glipiZIDE (GLUCOTROL XL) 10 MG 24 hr tablet, TAKE 1 TABLET BY MOUTH  DAILY WITH BREAKFAST, Disp: 90 tablet, Rfl: 0 .  glucose blood (ONETOUCH VERIO) test strip, USE TO TEST BLOOD GLUCOSE THREE TIMES DAILY., Disp: 300 each, Rfl: 0 .  metFORMIN (GLUCOPHAGE) 500 MG tablet, TAKE 1 TABLET(500 MG) BY MOUTH TWICE DAILY WITH A MEAL, Disp: 180 tablet, Rfl: 0 .  OneTouch Delica Lancets 30G MISC, 1 Device by Does not apply route 3 (three) times daily., Disp: 300 each, Rfl: 0 .  simvastatin (ZOCOR) 5 MG tablet, Take 1 tablet (5 mg total) by mouth daily. PLEASE FILL FOR 9 MONTHS.  THANKS, Disp: 90 tablet, Rfl: 0  EXAM:  VITALS per patient if applicable:  GENERAL: alert, oriented, appears well and in no acute distress  HEENT: atraumatic, conjunttiva clear, no obvious abnormalities on  inspection of external nose and ears  NECK: normal movements of the head and neck  LUNGS: on inspection no signs of respiratory distress, breathing rate appears normal, no obvious gross SOB, gasping or wheezing  CV: no obvious cyanosis  MS: moves all visible extremities without noticeable abnormality  PSYCH/NEURO: pleasant and cooperative, no obvious depression or anxiety, speech and thought processing grossly intact  ASSESSMENT AND PLAN:  Discussed the following assessment and plan:   1. Uncontrolled type 2 diabetes mellitus with hyperglycemia (HCC) -He has been struggling some time now to take his  medications as directed.  His A1c has increased from 8.2-8.4. He undertands the complications of diabetes and the uncontrolled nature if he does not take the medications as directed.  Then I recommended adding Trulicity, but the patient refuses stating if I cannot remember to take my medications now, it is useless to add another medication."  I do not disagree with this statement.  Encouraged to take control of his diabetes and take his medications as directed.  He was given multiple ideas on how to remind himself to take his medications including using a pill counter as well as setting a timer on his phone for twice a day.  We will follow-up in 3 months for repeat A1c     I discussed the assessment and treatment plan with the patient. The patient was provided an opportunity to ask questions and all were answered. The patient agreed with the plan and demonstrated an understanding of the instructions.   The patient was advised to call back or seek an in-person evaluation if the symptoms worsen or if the condition fails to improve as anticipated.   Shirline Freesory Sonia Bromell, NP

## 2018-04-20 ENCOUNTER — Telehealth: Payer: Self-pay

## 2018-04-20 DIAGNOSIS — E1165 Type 2 diabetes mellitus with hyperglycemia: Principal | ICD-10-CM

## 2018-04-20 DIAGNOSIS — IMO0001 Reserved for inherently not codable concepts without codable children: Secondary | ICD-10-CM

## 2018-04-20 NOTE — Telephone Encounter (Signed)
PA for glucose blood (ONETOUCH VERIO) test strip has been sent to cover my meds.  Key: [id number] - PA Case ID: [id number] - Rx #: [id number]

## 2018-05-12 MED ORDER — GLUCOSE BLOOD VI STRP: ORAL_STRIP | 11 refills | Status: DC

## 2018-05-12 NOTE — Telephone Encounter (Signed)
PA not needed. --- Case number [id number], requesting authorization of Onetouch tes verio, use as directed (300 per 90 days), has been cancelled for the following reason: Your plan allows this medication to be dispensed for a maximum of a 30 day supply. Reviewed by: Georgianne Fick.D.

## 2018-05-12 NOTE — Telephone Encounter (Signed)
30 day supply sent to the pharmacy.  

## 2018-07-16 ENCOUNTER — Other Ambulatory Visit: Payer: Self-pay | Admitting: Family Medicine

## 2018-07-16 DIAGNOSIS — IMO0001 Reserved for inherently not codable concepts without codable children: Secondary | ICD-10-CM

## 2018-07-16 NOTE — Telephone Encounter (Signed)
Pt due for A1C and follow up.  Left a message to return call.

## 2018-07-17 NOTE — Telephone Encounter (Signed)
Left a message for a return call.

## 2018-07-22 MED ORDER — GLIPIZIDE ER 10 MG PO TB24: ORAL_TABLET | ORAL | 0 refills | Status: DC

## 2018-07-22 MED ORDER — METFORMIN HCL 500 MG PO TABS: ORAL_TABLET | ORAL | 0 refills | Status: DC

## 2018-07-22 MED ORDER — SIMVASTATIN 5 MG PO TABS: 5.0000 mg | ORAL_TABLET | Freq: Every day | ORAL | 0 refills | Status: DC

## 2018-07-22 NOTE — Telephone Encounter (Signed)
Sent to the pharmacy by e-scribe. 

## 2018-07-24 ENCOUNTER — Other Ambulatory Visit (INDEPENDENT_AMBULATORY_CARE_PROVIDER_SITE_OTHER): Payer: 59

## 2018-07-24 ENCOUNTER — Other Ambulatory Visit: Payer: Self-pay

## 2018-07-24 DIAGNOSIS — E1165 Type 2 diabetes mellitus with hyperglycemia: Secondary | ICD-10-CM | POA: Diagnosis not present

## 2018-07-24 DIAGNOSIS — IMO0001 Reserved for inherently not codable concepts without codable children: Secondary | ICD-10-CM

## 2018-07-24 LAB — HEMOGLOBIN A1C: Hgb A1c MFr Bld: 7.8 % — ABNORMAL HIGH (ref 4.6–6.5)

## 2018-07-28 ENCOUNTER — Ambulatory Visit (INDEPENDENT_AMBULATORY_CARE_PROVIDER_SITE_OTHER): Payer: 59 | Admitting: Adult Health

## 2018-07-28 ENCOUNTER — Other Ambulatory Visit: Payer: Self-pay

## 2018-07-28 ENCOUNTER — Encounter: Payer: Self-pay | Admitting: Adult Health

## 2018-07-28 DIAGNOSIS — R519 Headache, unspecified: Secondary | ICD-10-CM

## 2018-07-28 DIAGNOSIS — R630 Anorexia: Secondary | ICD-10-CM

## 2018-07-28 DIAGNOSIS — E1165 Type 2 diabetes mellitus with hyperglycemia: Secondary | ICD-10-CM

## 2018-07-28 DIAGNOSIS — R51 Headache: Secondary | ICD-10-CM | POA: Diagnosis not present

## 2018-07-28 DIAGNOSIS — IMO0001 Reserved for inherently not codable concepts without codable children: Secondary | ICD-10-CM

## 2018-07-28 NOTE — Progress Notes (Signed)
Virtual Visit via Video Note  I connected with Joseph Barry on 07/28/18 at  3:00 PM EDT by a video enabled telemedicine application and verified that I am speaking with the correct person using two identifiers.  Location patient: home Location provider:work or home office Persons participating in the virtual visit: patient, provider  I discussed the limitations of evaluation and management by telemedicine and the availability of in person appointments. The patient expressed understanding and agreed to proceed.   HPI: 38 year old male who is being evaluated today for follow-up regarding uncontrolled diabetes type 2.  He is currently maintained on metformin 500 mg twice daily and glipizide 10 mg extended release daily.  During his visit in April he reported that he had not been taking his medications as directed and that he often forgot to take either all of his medications or remember either morning or nighttime but not both.  He was not exercising but was trying to eat healthier.  Today he reports that he continues to work on diet and he is taking his medications approximately 75% of the time.  He often forgets to take his medications on the weekend.  He does report that he was exercising for approximately 1 month, riding a stationary bike but has since stopped doing this  Additionally he complains of mild headaches happening on a daily basis well as loss of appetite.  Is been problematic for him for an unknown amount of time but it has an acute issue.  As we are discussing his diet he did report that he drinks 2 gallons or more of water a day.  Denies nausea, vomiting, or diarrhea.  His headaches have been turning into migraines and has gotten them approximately 3-4 times a month.  He did have migraines as a child   ROS: See pertinent positives and negatives per HPI.  Past Medical History:  Diagnosis Date  . Depression   . Diabetes mellitus type II, uncontrolled ([place])   . Eczema   .  Finger laceration involving tendon 05/20/2014   right index, long, ring, small fingers - tendon/artery/nerve lac.  Marland Kitchen GERD (gastroesophageal reflux disease)   . History of cardiac murmur as a child   . History of MRSA infection ~ 2007   back  . Hypertension    states under control with med., has been on med. x 1 yr.  . Migraines   . Sleep apnea    uses CPAP nightly    Past Surgical History:  Procedure Laterality Date  . NERVE, TENDON AND ARTERY REPAIR Right 05/24/2014   Procedure: RIGHT INDEX LONG RING SMALL/REPAIR TENDON, ARTERY NERVE;  Surgeon: Leanora Cover, MD;  Location: [place];  Service: Orthopedics;  Laterality: Right;  . ORBITAL FRACTURE SURGERY Left 2003    Family History  Problem Relation Age of Onset  . Arthritis Maternal Grandmother   . Diabetes Maternal Grandmother   . Heart disease Maternal Grandmother   . Hypertension Father   . Diabetes Father   . Colon polyps Father   . Hypertension Mother   . Heart disease Mother   . COPD Mother   . Cancer Paternal Grandmother        spine cancer  . Hypertension Sister       Current Outpatient Medications:  .  glipiZIDE (GLUCOTROL XL) 10 MG 24 hr tablet, TAKE 1 TABLET BY MOUTH  DAILY WITH BREAKFAST, Disp: 90 tablet, Rfl: 0 .  glucose blood (ONETOUCH VERIO) test strip, USE TO TEST BLOOD  GLUCOSE THREE TIMES DAILY., Disp: 90 each, Rfl: 11 .  metFORMIN (GLUCOPHAGE) 500 MG tablet, TAKE 1 TABLET(500 MG) BY MOUTH TWICE DAILY WITH A MEAL, Disp: 180 tablet, Rfl: 0 .  OneTouch Delica Lancets 30G MISC, 1 Device by Does not apply route 3 (three) times daily., Disp: 300 each, Rfl: 0 .  simvastatin (ZOCOR) 5 MG tablet, Take 1 tablet (5 mg total) by mouth daily., Disp: 90 tablet, Rfl: 0  EXAM:  VITALS per patient if applicable:  GENERAL: alert, oriented, appears well and in no acute distress  HEENT: atraumatic, conjunttiva clear, no obvious abnormalities on inspection of external nose and ears  NECK: normal  movements of the head and neck  LUNGS: on inspection no signs of respiratory distress, breathing rate appears normal, no obvious gross SOB, gasping or wheezing  CV: no obvious cyanosis  MS: moves all visible extremities without noticeable abnormality  PSYCH/NEURO: pleasant and cooperative, no obvious depression or anxiety, speech and thought processing grossly intact  ASSESSMENT AND PLAN:  Discussed the following assessment and plan:  1. Uncontrolled type 2 diabetes mellitus without complication, without long-term current use of insulin (HCC) -A1c has improved 8.4-7.8.  Encouraged to take medication 100% of the time as directed.  He has improved, no change in medication therapy at this time.  We will follow-up in 90 days.  He was advised to work on getting regular exercise.  2. Loss of appetite -Wondering if this is due to overhydration advised to cut back on water. -Follow-up if no improvement  3. Generalized headaches -Again, I am wondering if this is due to overhydration is an electrolyte abnormality.  He can supplement sugar-free Gatorade for water periodically throughout the day.  Follow-up if no improvement     I discussed the assessment and treatment plan with the patient. The patient was provided an opportunity to ask questions and all were answered. The patient agreed with the plan and demonstrated an understanding of the instructions.   The patient was advised to call back or seek an in-person evaluation if the symptoms worsen or if the condition fails to improve as anticipated.   Shirline Freesory [place], NP

## 2018-09-14 ENCOUNTER — Other Ambulatory Visit: Payer: Self-pay | Admitting: Adult Health

## 2018-09-14 DIAGNOSIS — IMO0001 Reserved for inherently not codable concepts without codable children: Secondary | ICD-10-CM

## 2018-09-15 NOTE — Telephone Encounter (Signed)
SENT TO THE PHARMACY BY E-SCRIBE FOR 90 DAYS.  I HAVE SCHEDULED THE PT FOR HIS CPX.

## 2018-11-12 ENCOUNTER — Encounter: Payer: 59 | Admitting: Adult Health

## 2018-11-16 ENCOUNTER — Encounter: Payer: Self-pay | Admitting: Adult Health

## 2018-11-19 ENCOUNTER — Telehealth (INDEPENDENT_AMBULATORY_CARE_PROVIDER_SITE_OTHER): Payer: 59 | Admitting: Adult Health

## 2018-11-19 ENCOUNTER — Other Ambulatory Visit: Payer: Self-pay

## 2018-11-19 DIAGNOSIS — G43011 Migraine without aura, intractable, with status migrainosus: Secondary | ICD-10-CM

## 2018-11-19 MED ORDER — AMITRIPTYLINE HCL 25 MG PO TABS: 25.0000 mg | ORAL_TABLET | Freq: Every day | ORAL | 0 refills | Status: DC

## 2018-11-19 NOTE — Progress Notes (Signed)
Virtual Visit via Video Note  I connected with Joseph Barry on 11/19/18 at 11:00 AM EST by a video enabled telemedicine application and verified that I am speaking with the correct person using two identifiers.  Location patient: home Location provider:work or home office Persons participating in the virtual visit: patient, provider  I discussed the limitations of evaluation and management by telemedicine and the availability of in person appointments. The patient expressed understanding and agreed to proceed.   HPI: 38 year old male who is being evaluated today for increasing migraine headaches.  He has a long history of migraine headaches but over the last few weeks he has been experiencing more frequent migraine headaches.  He reports that he is getting a migraine almost every other day.  In the past he would take Aleve, but this is no longer helping alleviate his migraines.  He is back in school and stares at a computer screen all day and then goes home to do schoolwork.  He feels as though migraines are stemming from stress as well as staring at a computer screen.  He has not been checking his blood sugars at home, and is past due for 100-month diabetic follow-up.   ROS: See pertinent positives and negatives per HPI.  Past Medical History:  Diagnosis Date  . Depression   . Diabetes mellitus type II, uncontrolled (HCC)   . Eczema   . Finger laceration involving tendon 05/20/2014   right index, long, ring, small fingers - tendon/artery/nerve lac.  Marland Kitchen GERD (gastroesophageal reflux disease)   . History of cardiac murmur as a child   . History of MRSA infection ~ 2007   back  . Hypertension    states under control with med., has been on med. x 1 yr.  . Migraines   . Sleep apnea    uses CPAP nightly    Past Surgical History:  Procedure Laterality Date  . NERVE, TENDON AND ARTERY REPAIR Right 05/24/2014   Procedure: RIGHT INDEX LONG RING SMALL/REPAIR TENDON, ARTERY NERVE;  Surgeon:  Betha Loa, MD;  Location: [facility];  Service: Orthopedics;  Laterality: Right;  . ORBITAL FRACTURE SURGERY Left 2003    Family History  Problem Relation Age of Onset  . Arthritis Maternal Grandmother   . Diabetes Maternal Grandmother   . Heart disease Maternal Grandmother   . Hypertension Father   . Diabetes Father   . Colon polyps Father   . Hypertension Mother   . Heart disease Mother   . COPD Mother   . Cancer Paternal Grandmother        spine cancer  . Hypertension Sister       Current Outpatient Medications:  .  glipiZIDE (GLUCOTROL XL) 10 MG 24 hr tablet, TAKE 1 TABLET BY MOUTH  DAILY WITH BREAKFAST, Disp: 90 tablet, Rfl: 0 .  glucose blood (ONETOUCH VERIO) test strip, USE TO TEST BLOOD GLUCOSE THREE TIMES DAILY., Disp: 90 each, Rfl: 11 .  metFORMIN (GLUCOPHAGE) 500 MG tablet, TAKE 1 TABLET BY MOUTH  TWICE DAILY WITH A MEAL, Disp: 180 tablet, Rfl: 0 .  OneTouch Delica Lancets 30G MISC, 1 Device by Does not apply route 3 (three) times daily., Disp: 300 each, Rfl: 0 .  simvastatin (ZOCOR) 5 MG tablet, TAKE 1 TABLET BY MOUTH  DAILY, Disp: 90 tablet, Rfl: 0  EXAM:  VITALS per patient if applicable:  GENERAL: alert, oriented, appears well and in no acute distress  HEENT: atraumatic, conjunttiva clear, no obvious abnormalities on inspection  of external nose and ears  NECK: normal movements of the head and neck  LUNGS: on inspection no signs of respiratory distress, breathing rate appears normal, no obvious gross SOB, gasping or wheezing  CV: no obvious cyanosis  MS: moves all visible extremities without noticeable abnormality  PSYCH/NEURO: pleasant and cooperative, no obvious depression or anxiety, speech and thought processing grossly intact  ASSESSMENT AND PLAN:  Discussed the following assessment and plan:  1. Intractable migraine without aura and with status migrainosus - Will trial him on Elavil 25 mg nightly. He was advised that this  medication will make him drowsy.  If he does not notice any improvement over the next 2 weeks and he will follow-up.  Is also advised to follow-up for 68-month diabetic check - amitriptyline (ELAVIL) 25 MG tablet; Take 1 tablet (25 mg total) by mouth at bedtime.  Dispense: 90 tablet; Refill: 0     I discussed the assessment and treatment plan with the patient. The patient was provided an opportunity to ask questions and all were answered. The patient agreed with the plan and demonstrated an understanding of the instructions.   The patient was advised to call back or seek an in-person evaluation if the symptoms worsen or if the condition fails to improve as anticipated.   Dorothyann Peng, NP

## 2019-02-11 ENCOUNTER — Other Ambulatory Visit: Payer: Self-pay

## 2019-02-11 ENCOUNTER — Ambulatory Visit (INDEPENDENT_AMBULATORY_CARE_PROVIDER_SITE_OTHER): Payer: 59 | Admitting: Adult Health

## 2019-02-11 ENCOUNTER — Encounter: Payer: Self-pay | Admitting: Adult Health

## 2019-02-11 VITALS — BP 126/70 | HR 93 | Temp 97.2°F | Ht 69.5 in | Wt 279.0 lb

## 2019-02-11 DIAGNOSIS — G43011 Migraine without aura, intractable, with status migrainosus: Secondary | ICD-10-CM | POA: Diagnosis not present

## 2019-02-11 DIAGNOSIS — E1165 Type 2 diabetes mellitus with hyperglycemia: Secondary | ICD-10-CM | POA: Diagnosis not present

## 2019-02-11 DIAGNOSIS — E782 Mixed hyperlipidemia: Secondary | ICD-10-CM

## 2019-02-11 DIAGNOSIS — Z6841 Body Mass Index (BMI) 40.0 and over, adult: Secondary | ICD-10-CM

## 2019-02-11 DIAGNOSIS — Z0000 Encounter for general adult medical examination without abnormal findings: Secondary | ICD-10-CM | POA: Diagnosis not present

## 2019-02-11 LAB — COMPREHENSIVE METABOLIC PANEL
ALT: 19 U/L (ref 0–53)
AST: 13 U/L (ref 0–37)
Albumin: 4.3 g/dL (ref 3.5–5.2)
Alkaline Phosphatase: 68 U/L (ref 39–117)
BUN: 15 mg/dL (ref 6–23)
CO2: 27 mEq/L (ref 19–32)
Calcium: 9.9 mg/dL (ref 8.4–10.5)
Chloride: 100 mEq/L (ref 96–112)
Creatinine, Ser: 1.11 mg/dL (ref 0.40–1.50)
GFR: 89.37 mL/min (ref >60.00)
Glucose, Bld: 292 mg/dL — ABNORMAL HIGH (ref 70–99)
Potassium: 5 mEq/L (ref 3.5–5.1)
Sodium: 135 mEq/L (ref 135–145)
Total Bilirubin: 0.5 mg/dL (ref 0.2–1.2)
Total Protein: 7.1 g/dL (ref 6.0–8.3)

## 2019-02-11 LAB — CBC WITH DIFFERENTIAL/PLATELET
Basophils Absolute: 0.1 10*3/uL (ref 0.0–0.1)
Basophils Relative: 0.8 % (ref 0.0–3.0)
Eosinophils Absolute: 0.2 10*3/uL (ref 0.0–0.7)
Eosinophils Relative: 2.3 % (ref 0.0–5.0)
HCT: 45.2 % (ref 39.0–52.0)
Hemoglobin: 15.1 g/dL (ref 13.0–17.0)
Lymphocytes Relative: 29.3 % (ref 12.0–46.0)
Lymphs Abs: 1.9 10*3/uL (ref 0.7–4.0)
MCHC: 33.4 g/dL (ref 30.0–36.0)
MCV: 91 fl (ref 78.0–100.0)
Monocytes Absolute: 0.4 10*3/uL (ref 0.1–1.0)
Monocytes Relative: 5.6 % (ref 3.0–12.0)
Neutro Abs: 4.1 10*3/uL (ref 1.4–7.7)
Neutrophils Relative %: 62 % (ref 43.0–77.0)
Platelets: 355 10*3/uL (ref 150.0–400.0)
RBC: 4.97 Mil/uL (ref 4.22–5.81)
RDW: 13.3 % (ref 11.5–15.5)
WBC: 6.6 10*3/uL (ref 4.0–10.5)

## 2019-02-11 LAB — LIPID PANEL
Cholesterol: 208 mg/dL — ABNORMAL HIGH (ref 0–200)
HDL: 38.5 mg/dL — ABNORMAL LOW (ref >39.00)
LDL Cholesterol: 146 mg/dL — ABNORMAL HIGH (ref 0–99)
NonHDL: 169.71
Total CHOL/HDL Ratio: 5
Triglycerides: 117 mg/dL (ref 0.0–149.0)
VLDL: 23.4 mg/dL (ref 0.0–40.0)

## 2019-02-11 LAB — HEMOGLOBIN A1C: Hgb A1c MFr Bld: 9.7 % — ABNORMAL HIGH (ref 4.6–6.5)

## 2019-02-11 LAB — MICROALBUMIN / CREATININE URINE RATIO
Creatinine,U: 245.9 mg/dL
Microalb Creat Ratio: 1 mg/g (ref 0.0–30.0)
Microalb, Ur: 2.4 mg/dL — ABNORMAL HIGH (ref 0.0–1.9)

## 2019-02-11 LAB — TSH: TSH: 1.42 u[IU]/mL (ref 0.35–4.50)

## 2019-02-11 MED ORDER — GLIPIZIDE ER 10 MG PO TB24: 10.0000 mg | ORAL_TABLET | Freq: Every day | ORAL | 0 refills | Status: DC

## 2019-02-11 MED ORDER — ONETOUCH VERIO VI STRP: ORAL_STRIP | 3 refills | Status: DC

## 2019-02-11 MED ORDER — ONETOUCH DELICA LANCETS 30G MISC: 1.0000 | Freq: Three times a day (TID) | 3 refills | Status: DC

## 2019-02-11 MED ORDER — AMITRIPTYLINE HCL 25 MG PO TABS: 25.0000 mg | ORAL_TABLET | Freq: Every day | ORAL | 3 refills | Status: DC

## 2019-02-11 MED ORDER — METFORMIN HCL 500 MG PO TABS: ORAL_TABLET | ORAL | 0 refills | Status: DC

## 2019-02-11 NOTE — Progress Notes (Signed)
Subjective:    Patient ID: Joseph Barry, male    DOB: Dec 19, 1980, 39 y.o.   MRN: [id number]  HPI Patient presents for yearly preventative medicine examination. He is a pleasant 39 year old male who  has a past medical history of Depression, Diabetes mellitus type II, uncontrolled (HCC), Eczema, Finger laceration involving tendon (05/20/2014), GERD (gastroesophageal reflux disease), History of cardiac murmur as a child, History of MRSA infection (~ 2007), Hypertension, Migraines, and Sleep apnea.  DM -currently prescribed metformin 500 mg twice daily and glipizide 10 mg extended release daily.  He has been uncontrolled for some time now this is due to inconsistencies in diet and medication adherence.  When he was last seen in July 2020 his A1c improved from 8.4-7.8.  He reports that he has been more adherent to medication and is taking it every day.  He is not checking his blood sugars at home.  He reports that his diet continues to be poor and he is not exercising routinely.  Hyperlipidemia - takes Simvastatin 5 mg. He denies myalgia or fatigue  Lab Results  Component Value Date   CHOL 193 10/15/2017   HDL 41.90 10/15/2017   LDLCALC 129 (H) 10/15/2017   TRIG 111.0 10/15/2017   CHOLHDL 5 10/15/2017   History of migraine headaches.-Was last evaluated for this in November 2020.  At that time he was started on Elavil 25 mg for more frequent migraine headaches. He reports significant improvement in migraines since starting Elavil.   All immunizations and health maintenance protocols were reviewed with the patient and needed orders were placed.  He refuses influenza and pneumonia vaccination  Appropriate screening laboratory values were ordered for the patient including screening of hyperlipidemia, renal function and hepatic function.  Medication reconciliation,  past medical history, social history, problem list and allergies were reviewed in detail with the patient  Goals were established  with regard to weight loss, exercise, and  diet in compliance with medications  Wt Readings from Last 3 Encounters:  02/11/19 279 lb (126.6 kg)  01/15/18 282 lb (127.9 kg)  10/15/17 279 lb 2 oz (126.6 kg)   He has not had a diabetic eye exam done yet  Review of Systems  Constitutional: Negative.   HENT: Negative.   Eyes: Negative.   Respiratory: Negative.   Cardiovascular: Negative.   Gastrointestinal: Negative.   Endocrine: Negative.   Genitourinary: Negative.   Musculoskeletal: Negative.   Skin: Negative.   Allergic/Immunologic: Negative.   Neurological: Negative.   Hematological: Negative.   Psychiatric/Behavioral: Negative.   All other systems reviewed and are negative.  Past Medical History:  Diagnosis Date  . Depression   . Diabetes mellitus type II, uncontrolled (HCC)   . Eczema   . Finger laceration involving tendon 05/20/2014   right index, long, ring, small fingers - tendon/artery/nerve lac.  Marland Kitchen GERD (gastroesophageal reflux disease)   . History of cardiac murmur as a child   . History of MRSA infection ~ 2007   back  . Hypertension    states under control with med., has been on med. x 1 yr.  . Migraines   . Sleep apnea    uses CPAP nightly    Social History   Socioeconomic History  . Marital status: Married    Spouse name: Not on file  . Number of children: Not on file  . Years of education: Not on file  . Highest education level: Not on file  Occupational History  . Not on  file  Tobacco Use  . Smoking status: Current Every Day Smoker    Years: 5.00    Types: Cigars  . Smokeless tobacco: Never Used  . Tobacco comment: 5 cigars/day  Substance and Sexual Activity  . Alcohol use: Yes    Alcohol/week: 0.0 standard drinks    Comment: occasionally  . Drug use: No  . Sexual activity: Not on file  Other Topics Concern  . 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    Married for five years    Has step daughter.    Has rabbit and dog.    Social Determinants of Health   Financial Resource Strain:   . Difficulty of Paying Living Expenses: Not on file  Food Insecurity:   . Worried About Charity fundraiser in the Last Year: Not on file  . Ran Out of Food in the Last Year: Not on file  Transportation Needs:   . Lack of Transportation (Medical): Not on file  . Lack of Transportation (Non-Medical): Not on file  Physical Activity:   . Days of Exercise per Week: Not on file  . Minutes of Exercise per Session: Not on file  Stress:   . Feeling of Stress : Not on file  Social Connections:   . Frequency of Communication with Friends and Family: Not on file  . Frequency of Social Gatherings with Friends and Family: Not on file  . Attends Religious Services: Not on file  . Active Member of Clubs or Organizations: Not on file  . Attends Archivist Meetings: Not on file  . Marital Status: Not on file  Intimate Partner Violence:   . Fear of Current or Ex-Partner: Not on file  . Emotionally Abused: Not on file  . Physically Abused: Not on file  . Sexually Abused: Not on file    Past Surgical History:  Procedure Laterality Date  . NERVE, TENDON AND ARTERY REPAIR Right 05/24/2014   Procedure: RIGHT INDEX LONG RING SMALL/REPAIR TENDON, ARTERY NERVE;  Surgeon: Leanora Cover, MD;  Location: [place] [facility];  Service: Orthopedics;  Laterality: Right;  . ORBITAL FRACTURE SURGERY Left 2003    Family History  Problem Relation Age of Onset  . Arthritis Maternal Grandmother   . Diabetes Maternal Grandmother   . Heart disease Maternal Grandmother   . Hypertension Father   . Diabetes Father   . Colon polyps Father   . Hypertension Mother   . Heart disease Mother   . COPD Mother   . Cancer Paternal Grandmother        spine cancer  . Hypertension Sister     Allergies  Allergen Reactions  . Shellfish Allergy Shortness Of Breath and Swelling    Current  Outpatient Medications on File Prior to Visit  Medication Sig Dispense Refill  . simvastatin (ZOCOR) 5 MG tablet TAKE 1 TABLET BY MOUTH  DAILY 90 tablet 0   No current facility-administered medications on file prior to visit.    BP 126/70   Pulse 93   Temp (!) 97.2 F (36.2 C) (Other (Comment))   Ht 5' 9.5" (1.765 m)   Wt 279 lb (126.6 kg)   SpO2 96%   BMI 40.61 kg/m       Objective:   Physical Exam Vitals and nursing note reviewed.  Constitutional:      General: He is not in acute distress.    Appearance: He is  well-developed. He is obese. He is not diaphoretic.  HENT:     Head: Normocephalic and atraumatic.     Right Ear: Tympanic membrane, ear canal and external ear normal. There is no impacted cerumen.     Left Ear: Tympanic membrane, ear canal and external ear normal. There is no impacted cerumen.     Nose: Nose normal.     Mouth/Throat:     Mouth: Mucous membranes are moist.     Pharynx: Oropharynx is clear. No oropharyngeal exudate.  Eyes:     General:        Right eye: No discharge.        Left eye: No discharge.     Conjunctiva/sclera: Conjunctivae normal.     Pupils: Pupils are equal, round, and reactive to light.  Neck:     Thyroid: No thyromegaly.     Trachea: No tracheal deviation.  Cardiovascular:     Rate and Rhythm: Normal rate and regular rhythm.     Pulses: Normal pulses.     Heart sounds: Normal heart sounds. No murmur. No friction rub. No gallop.   Pulmonary:     Effort: Pulmonary effort is normal. No respiratory distress.     Breath sounds: Normal breath sounds. No stridor. No wheezing, rhonchi or rales.  Chest:     Chest wall: No tenderness.  Abdominal:     General: Bowel sounds are normal. There is no distension.     Palpations: Abdomen is soft. There is no mass.     Tenderness: There is no abdominal tenderness. There is no right CVA tenderness, left CVA tenderness, guarding or rebound.     Hernia: No hernia is present.  Musculoskeletal:         General: No swelling, tenderness, deformity or signs of injury. Normal range of motion.     Right lower leg: No edema.     Left lower leg: No edema.  Lymphadenopathy:     Cervical: No cervical adenopathy.  Skin:    General: Skin is warm and dry.     Coloration: Skin is not jaundiced or pale.     Findings: No bruising, erythema, lesion or rash.  Neurological:     General: No focal deficit present.     Mental Status: He is alert and oriented to person, place, and time. Mental status is at baseline.     Cranial Nerves: No cranial nerve deficit.     Coordination: Coordination normal.  Psychiatric:        Mood and Affect: Mood normal.        Behavior: Behavior normal.        Thought Content: Thought content normal.        Judgment: Judgment normal.       Assessment & Plan:  1. Routine general medical examination at a health care facility -Needs to lose weight through lifestyle modifications -Follow-up in 1 year for next CPE - CBC with Differential/Platelet - Comprehensive metabolic panel - Hemoglobin A1c - Lipid panel - TSH  2. Uncontrolled type 2 diabetes mellitus with hyperglycemia (HCC) -Hopefully A1c has improved.  Consider adding agent.  Follow-up in 3 months.  Advised diabetic eye exam - CBC with Differential/Platelet - Comprehensive metabolic panel - Hemoglobin A1c - Lipid panel - TSH - Microalbumin/Creatinine Ratio, Urine - glipiZIDE (GLUCOTROL XL) 10 MG 24 hr tablet; Take 1 tablet (10 mg total) by mouth daily with breakfast.  Dispense: 90 tablet; Refill: 0 - metFORMIN (GLUCOPHAGE) 500 MG tablet; TAKE  1 TABLET BY MOUTH  TWICE DAILY WITH A MEAL  Dispense: 180 tablet; Refill: 0 - glucose blood (ONETOUCH VERIO) test strip; USE TO TEST BLOOD GLUCOSE THREE TIMES DAILY.  Dispense: 300 each; Refill: 3  3. Intractable migraine without aura and with status migrainosus -Much improved.  Continue with Elavil 25 mg - amitriptyline (ELAVIL) 25 MG tablet; Take 1 tablet (25 mg  total) by mouth at bedtime.  Dispense: 90 tablet; Refill: 3  4. Mixed hyperlipidemia - Consider increase in statin  - CBC with Differential/Platelet - Comprehensive metabolic panel - Hemoglobin A1c - Lipid panel - TSH  5. Class 3 severe obesity with serious comorbidity and body mass index (BMI) of 40.0 to 44.9 in adult, unspecified obesity type (HCC)  - CBC with Differential/Platelet - Comprehensive metabolic panel - Hemoglobin A1c - Lipid panel - TSH   Shirline Frees, NP   This visit occurred during the SARS-CoV-2 public health emergency.  Safety protocols were in place, including screening questions prior to the visit, additional usage of staff PPE, and extensive cleaning of exam room while observing appropriate contact time as indicated for disinfecting solutions.

## 2019-02-11 NOTE — Patient Instructions (Addendum)
It was great seeing you today   Continue to work on weight loss through diet and exercise  I will follow up with you regarding your blood work   Follow up in three months

## 2019-02-12 ENCOUNTER — Other Ambulatory Visit: Payer: Self-pay | Admitting: Adult Health

## 2019-02-12 MED ORDER — SIMVASTATIN 20 MG PO TABS: 20.0000 mg | ORAL_TABLET | Freq: Every day | ORAL | 3 refills | Status: DC

## 2019-04-19 ENCOUNTER — Other Ambulatory Visit: Payer: Self-pay | Admitting: Adult Health

## 2019-04-19 DIAGNOSIS — E1165 Type 2 diabetes mellitus with hyperglycemia: Secondary | ICD-10-CM

## 2019-04-20 NOTE — Telephone Encounter (Signed)
Sent to the pharmacy by e-scribe for 90 days.  Pt has upcoming appt on 05/11/19.

## 2019-05-10 ENCOUNTER — Other Ambulatory Visit: Payer: Self-pay

## 2019-05-11 ENCOUNTER — Encounter: Payer: Self-pay | Admitting: Adult Health

## 2019-05-11 ENCOUNTER — Ambulatory Visit (INDEPENDENT_AMBULATORY_CARE_PROVIDER_SITE_OTHER): Payer: 59 | Admitting: Adult Health

## 2019-05-11 VITALS — BP 126/96 | Temp 97.5°F | Wt 263.0 lb

## 2019-05-11 DIAGNOSIS — E1165 Type 2 diabetes mellitus with hyperglycemia: Secondary | ICD-10-CM

## 2019-05-11 LAB — POCT GLYCOSYLATED HEMOGLOBIN (HGB A1C): HbA1c, POC (controlled diabetic range): 12.2 % — AB (ref 0.0–7.0)

## 2019-05-11 MED ORDER — FREESTYLE LIBRE 14 DAY READER DEVI: 3 refills | Status: DC

## 2019-05-11 MED ORDER — EMPAGLIFLOZIN 10 MG PO TABS: 10.0000 mg | ORAL_TABLET | Freq: Every day | ORAL | 0 refills | Status: DC

## 2019-05-11 MED ORDER — FREESTYLE LIBRE 14 DAY SENSOR MISC: 3 refills | Status: DC

## 2019-05-11 NOTE — Patient Instructions (Signed)
Your A1c was 12.2   I am going to prescribe a new medication called Jardiance   Please follow up in 3 months   Need to start eating out less

## 2019-05-11 NOTE — Addendum Note (Signed)
Addended by: Raj Janus T on: 05/11/2019 10:43 AM   Modules accepted: Orders

## 2019-05-11 NOTE — Progress Notes (Signed)
Subjective:    Patient ID: Joseph Barry, male    DOB: [birth year], 39 y.o.   MRN: [id number]  HPI  39 year old male who  has a past medical history of Depression, Diabetes mellitus type II, uncontrolled (HCC), Eczema, Finger laceration involving tendon (05/20/2014), GERD (gastroesophageal reflux disease), History of cardiac murmur as a child, History of MRSA infection (~ 2007), Hypertension, Migraines, and Sleep apnea.  He presents to the office today for follow up regarding DM. He is currently prescribed Metformin 500 mg BID and glipizide 10 mg ER daily. He has been uncontrolled for awhile due to inconsistencies in diet and medication adherence.  Today he reports that he is eating out more for dinners but is taking his medications nearly every day.  He has not monitoring his blood sugars on a routine basis and is interested in the [place] system so he can keep track of these numbers.  He continues to work long hours and does not exercise on a routine basis.  Wt Readings from Last 3 Encounters:  05/11/19 263 lb (119.3 kg)  02/11/19 279 lb (126.6 kg)  01/15/18 282 lb (127.9 kg)   Lab Results  Component Value Date   HGBA1C 12.2 (A) 05/11/2019     Review of Systems See HPI   Past Medical History:  Diagnosis Date  . Depression   . Diabetes mellitus type II, uncontrolled (HCC)   . Eczema   . Finger laceration involving tendon 05/20/2014   right index, long, ring, small fingers - tendon/artery/nerve lac.  Marland Kitchen GERD (gastroesophageal reflux disease)   . History of cardiac murmur as a child   . History of MRSA infection ~ 2007   back  . Hypertension    states under control with med., has been on med. x 1 yr.  . Migraines   . Sleep apnea    uses CPAP nightly    Social History   Socioeconomic History  . Marital status: Married    Spouse name: Not on file  . Number of children: Not on file  . Years of education: Not on file  . Highest education level: Not on file  Occupational  History  . Not on file  Tobacco Use  . Smoking status: Current Every Day Smoker    Years: 5.00    Types: Cigars  . Smokeless tobacco: Never Used  . Tobacco comment: 5 cigars/day  Substance and Sexual Activity  . Alcohol use: Yes    Alcohol/week: 0.0 standard drinks    Comment: occasionally  . Drug use: No  . Sexual activity: Not on file  Other Topics Concern  . 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    Married for five years   Has step daughter.    Has rabbit and dog.    Social Determinants of Health   Financial Resource Strain:   . Difficulty of Paying Living Expenses:   Food Insecurity:   . Worried About Programme researcher, broadcasting/film/video in the Last Year:   . Barista in the Last Year:   Transportation Needs:   . Freight forwarder (Medical):   Marland Kitchen Lack of Transportation (Non-Medical):   Physical Activity:   . Days of Exercise per Week:   . Minutes of Exercise per Session:   Stress:   . Feeling of Stress :   Social Connections:   . Frequency of Communication with Friends and Family:   .  Frequency of Social Gatherings with Friends and Family:   . Attends Religious Services:   . Active Member of Clubs or Organizations:   . Attends Banker Meetings:   Marland Kitchen Marital Status:   Intimate Partner Violence:   . Fear of Current or Ex-Partner:   . Emotionally Abused:   Marland Kitchen Physically Abused:   . Sexually Abused:     Past Surgical History:  Procedure Laterality Date  . NERVE, TENDON AND ARTERY REPAIR Right 05/24/2014   Procedure: RIGHT INDEX LONG RING SMALL/REPAIR TENDON, ARTERY NERVE;  Surgeon: Betha Loa, MD;  Location: [facility];  Service: Orthopedics;  Laterality: Right;  . ORBITAL FRACTURE SURGERY Left 2003    Family History  Problem Relation Age of Onset  . Arthritis Maternal Grandmother   . Diabetes Maternal Grandmother   . Heart disease Maternal Grandmother   . Hypertension  Father   . Diabetes Father   . Colon polyps Father   . Hypertension Mother   . Heart disease Mother   . COPD Mother   . Cancer Paternal Grandmother        spine cancer  . Hypertension Sister     Allergies  Allergen Reactions  . Shellfish Allergy Shortness Of Breath and Swelling    Current Outpatient Medications on File Prior to Visit  Medication Sig Dispense Refill  . amitriptyline (ELAVIL) 25 MG tablet Take 1 tablet (25 mg total) by mouth at bedtime. 90 tablet 3  . glipiZIDE (GLUCOTROL XL) 10 MG 24 hr tablet TAKE 1 TABLET BY MOUTH  DAILY WITH BREAKFAST 90 tablet 0  . glucose blood (ONETOUCH VERIO) test strip USE TO TEST BLOOD GLUCOSE THREE TIMES DAILY. 300 each 3  . LITHIUM PO Take 2,000 mg by mouth daily. OTC    . Magnesium 250 MG TABS Take 1 tablet by mouth daily.    . metFORMIN (GLUCOPHAGE) 500 MG tablet TAKE 1 TABLET BY MOUTH  TWICE DAILY WITH MEALS 180 tablet 0  . OneTouch Delica Lancets 30G MISC 1 Device by Does not apply route 3 (three) times daily. 300 each 3  . POTASSIUM PO Take 250 mg by mouth daily.    . Probiotic Product (PROBIOTIC PO) Take by mouth.    . simvastatin (ZOCOR) 20 MG tablet Take 1 tablet (20 mg total) by mouth at bedtime. 90 tablet 3   No current facility-administered medications on file prior to visit.    BP (!) 126/96   Temp (!) 97.5 F (36.4 C)   Wt 263 lb (119.3 kg)   BMI 38.28 kg/m       Objective:   Physical Exam Vitals and nursing note reviewed.  Constitutional:      Appearance: Normal appearance.  HENT:     Mouth/Throat:     Mouth: Mucous membranes are dry.  Cardiovascular:     Rate and Rhythm: Normal rate and regular rhythm.     Pulses: Normal pulses.     Heart sounds: Normal heart sounds.  Pulmonary:     Effort: Pulmonary effort is normal.     Breath sounds: Normal breath sounds.  Skin:    General: Skin is warm and dry.  Neurological:     General: No focal deficit present.     Mental Status: He is alert and oriented to  person, place, and time.  Psychiatric:        Mood and Affect: Mood normal.        Behavior: Behavior normal.  Thought Content: Thought content normal.        Judgment: Judgment normal.       Assessment & Plan:  1. Uncontrolled type 2 diabetes mellitus with hyperglycemia (HCC)  - POCT A1C- 12.2 - not at goal  - Would like to place him on Trulicity but it is not approved through his insurance.  - needs to make healthier dietary options and start working out more often  -We will trial him on Jardiance 10 mg and send in [place] system.  Continue with Metformin and glipizide.  Follow-up in 90 days - empagliflozin (JARDIANCE) 10 MG TABS tablet; Take 10 mg by mouth daily before breakfast.  Dispense: 90 tablet; Refill: 0  Dorothyann Peng, NP

## 2019-05-25 ENCOUNTER — Encounter: Payer: Self-pay | Admitting: Adult Health

## 2019-07-14 ENCOUNTER — Encounter: Payer: Self-pay | Admitting: Adult Health

## 2019-07-14 DIAGNOSIS — E1165 Type 2 diabetes mellitus with hyperglycemia: Secondary | ICD-10-CM

## 2019-07-15 MED ORDER — METFORMIN HCL 500 MG PO TABS: ORAL_TABLET | ORAL | 0 refills | Status: DC

## 2019-07-15 MED ORDER — SIMVASTATIN 20 MG PO TABS: 20.0000 mg | ORAL_TABLET | Freq: Every day | ORAL | 2 refills | Status: DC

## 2019-07-15 MED ORDER — GLIPIZIDE ER 10 MG PO TB24: 10.0000 mg | ORAL_TABLET | Freq: Every day | ORAL | 0 refills | Status: DC

## 2019-07-16 ENCOUNTER — Encounter: Payer: Self-pay | Admitting: Adult Health

## 2019-07-16 ENCOUNTER — Ambulatory Visit (INDEPENDENT_AMBULATORY_CARE_PROVIDER_SITE_OTHER): Payer: 59 | Admitting: Adult Health

## 2019-07-16 ENCOUNTER — Other Ambulatory Visit: Payer: Self-pay

## 2019-07-16 ENCOUNTER — Ambulatory Visit (INDEPENDENT_AMBULATORY_CARE_PROVIDER_SITE_OTHER)
Admission: RE | Admit: 2019-07-16 | Discharge: 2019-07-16 | Disposition: A | Discharge status: Home / Self Care | Payer: 59 | Source: Ambulatory Visit | Attending: Adult Health | Admitting: Adult Health

## 2019-07-16 VITALS — BP 130/96 | Temp 98.7°F | Wt 264.0 lb

## 2019-07-16 DIAGNOSIS — M79671 Pain in right foot: Secondary | ICD-10-CM

## 2019-07-16 DIAGNOSIS — G43011 Migraine without aura, intractable, with status migrainosus: Secondary | ICD-10-CM

## 2019-07-16 MED ORDER — RIMEGEPANT SULFATE 75 MG PO TBDP: 75.0000 mg | ORAL_TABLET | Freq: Every day | ORAL | 2 refills | Status: DC | PRN

## 2019-07-16 NOTE — Patient Instructions (Signed)
Please go to the [facility] office for the xray   I have sent in Nurtec that you can take as needed when a migraine pops up. There is a saving card online and on the app for this

## 2019-07-16 NOTE — Progress Notes (Signed)
Subjective:    Patient ID: Joseph Barry, male    DOB: Aug 17, 1980, 39 y.o.   MRN: [id number]  HPI  39 year old male who  has a past medical history of Depression, Diabetes mellitus type II, uncontrolled (HCC), Eczema, Finger laceration involving tendon (05/20/2014), GERD (gastroesophageal reflux disease), History of cardiac murmur as a child, History of MRSA infection (~ 2007), Hypertension, Migraines, and Sleep apnea.  He presents to the office today for an acute issue of pain in the great toe in his right foot. His pain has been present for three weeks. He reports trauma to the toe when he fell down 2 stairs at a family member's house and his right foot flexed backwards.  He denies pain with walking or weightbearing but does have pain with movement.  He has no loss of range of motion with the great toe.  Does endorse some mild swelling.  No redness or warmth  He has not been doing any home treatments with ice or heat.  Nor is he taking any over-the-counter medications.  Additionally, he does have a history of migraines which have been less frequent but he is wondering if he can get something to take on an as-needed basis when the migraines first start.  Review of Systems See HPI   Past Medical History:  Diagnosis Date  . Depression   . Diabetes mellitus type II, uncontrolled (HCC)   . Eczema   . Finger laceration involving tendon 05/20/2014   right index, long, ring, small fingers - tendon/artery/nerve lac.  Marland Kitchen GERD (gastroesophageal reflux disease)   . History of cardiac murmur as a child   . History of MRSA infection ~ 2007   back  . Hypertension    states under control with med., has been on med. x 1 yr.  . Migraines   . Sleep apnea    uses CPAP nightly    Social History   Socioeconomic History  . Marital status: Married    Spouse name: Not on file  . Number of children: Not on file  . Years of education: Not on file  . Highest education level: Not on file    Occupational History  . Not on file  Tobacco Use  . Smoking status: Current Every Day Smoker    Years: 5.00    Types: Cigars  . Smokeless tobacco: Never Used  . Tobacco comment: 5 cigars/day  Substance and Sexual Activity  . Alcohol use: Yes    Alcohol/week: 0.0 standard drinks    Comment: occasionally  . Drug use: No  . Sexual activity: Not on file  Other Topics Concern  . 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    Married for five years   Has step daughter.    Has rabbit and dog.    Social Determinants of Health   Financial Resource Strain:   . Difficulty of Paying Living Expenses:   Food Insecurity:   . Worried About Programme researcher, broadcasting/film/video in the Last Year:   . Barista in the Last Year:   Transportation Needs:   . Freight forwarder (Medical):   Marland Kitchen Lack of Transportation (Non-Medical):   Physical Activity:   . Days of Exercise per Week:   . Minutes of Exercise per Session:   Stress:   . Feeling of Stress :   Social Connections:   . Frequency of Communication with Friends and  Family:   . Frequency of Social Gatherings with Friends and Family:   . Attends Religious Services:   . Active Member of Clubs or Organizations:   . Attends Banker Meetings:   Marland Kitchen Marital Status:   Intimate Partner Violence:   . Fear of Current or Ex-Partner:   . Emotionally Abused:   Marland Kitchen Physically Abused:   . Sexually Abused:     Past Surgical History:  Procedure Laterality Date  . NERVE, TENDON AND ARTERY REPAIR Right 05/24/2014   Procedure: RIGHT INDEX LONG RING SMALL/REPAIR TENDON, ARTERY NERVE;  Surgeon: Betha Loa, MD;  Location: [facility];  Service: Orthopedics;  Laterality: Right;  . ORBITAL FRACTURE SURGERY Left 2003    Family History  Problem Relation Age of Onset  . Arthritis Maternal Grandmother   . Diabetes Maternal Grandmother   . Heart disease Maternal Grandmother   .  Hypertension Father   . Diabetes Father   . Colon polyps Father   . Hypertension Mother   . Heart disease Mother   . COPD Mother   . Cancer Paternal Grandmother        spine cancer  . Hypertension Sister     Allergies  Allergen Reactions  . Shellfish Allergy Shortness Of Breath and Swelling    Current Outpatient Medications on File Prior to Visit  Medication Sig Dispense Refill  . Continuous Blood Gluc Receiver (FREESTYLE LIBRE 14 DAY READER) DEVI USE ONE DEVICE EVERY 14 DAYS TO MONITOR BLOOD GLUCOSE 6 each 3  . Continuous Blood Gluc Sensor (FREESTYLE LIBRE 14 DAY SENSOR) MISC USE ONE DEVICE EVERY 14 DAYS TO MONITOR BLOOD GLUCOSE 6 each 3  . empagliflozin (JARDIANCE) 10 MG TABS tablet Take 10 mg by mouth daily before breakfast. 90 tablet 0  . glipiZIDE (GLUCOTROL XL) 10 MG 24 hr tablet Take 1 tablet (10 mg total) by mouth daily with breakfast. 90 tablet 0  . glucose blood (ONETOUCH VERIO) test strip USE TO TEST BLOOD GLUCOSE THREE TIMES DAILY. 300 each 3  . LITHIUM PO Take 2,000 mg by mouth daily. OTC    . Magnesium 250 MG TABS Take 1 tablet by mouth daily.    . metFORMIN (GLUCOPHAGE) 500 MG tablet TAKE 1 TABLET BY MOUTH  TWICE DAILY WITH MEALS 180 tablet 0  . OneTouch Delica Lancets 30G MISC 1 Device by Does not apply route 3 (three) times daily. 300 each 3  . POTASSIUM PO Take 250 mg by mouth daily.    . Probiotic Product (PROBIOTIC PO) Take by mouth.    . simvastatin (ZOCOR) 20 MG tablet Take 1 tablet (20 mg total) by mouth at bedtime. 90 tablet 2  . amitriptyline (ELAVIL) 25 MG tablet Take 1 tablet (25 mg total) by mouth at bedtime. 90 tablet 3   No current facility-administered medications on file prior to visit.    BP (!) 130/96   Temp 98.7 F (37.1 C)   Wt 264 lb (119.7 kg)   BMI 38.43 kg/m       Objective:   Physical Exam Vitals and nursing note reviewed.  Musculoskeletal:        General: Swelling and tenderness present. Normal range of motion.     Comments:  He does have some tenderness with palpation to the metatarsophalangeal joint of right great toe.   Is able to move digits without loss of ROM   Mild swelling noted  Skin:    General: Skin is warm and dry.  Capillary Refill: Capillary refill takes less than 2 seconds.  Neurological:     General: No focal deficit present.     Mental Status: He is oriented to person, place, and time.  Psychiatric:        Mood and Affect: Mood normal.        Behavior: Behavior normal.        Thought Content: Thought content normal.        Judgment: Judgment normal.       Assessment & Plan:  1. Right foot pain -Doubt fracture but cannot rule out sprain such as turf toe.  Will get x-ray to look for stress fracture.  Advised anti-inflammatories and ice.  Will follow up once x-ray is complete - DG Foot Complete Right; Future  2. Intractable migraine without aura and with status migrainosus - Can get savings card online  - Rimegepant Sulfate 75 MG TBDP; Take 75 mg by mouth daily as needed.  Dispense: 30 tablet; Refill: 2  Shirline Frees, NP

## 2019-07-17 ENCOUNTER — Other Ambulatory Visit: Payer: Self-pay | Admitting: Adult Health

## 2019-07-17 DIAGNOSIS — E1165 Type 2 diabetes mellitus with hyperglycemia: Secondary | ICD-10-CM

## 2019-07-20 NOTE — Telephone Encounter (Signed)
Sent to the pharmacy by e-scribe. 

## 2019-07-27 ENCOUNTER — Telehealth: Payer: Self-pay | Admitting: Family Medicine

## 2019-07-27 NOTE — Telephone Encounter (Signed)
Prior authorization was denied because this drug and/or diagnosis are not a covered benefit and excluded from coverage in accordance with the terms and conditions of your plan benefit.  Please advise.  Thanks

## 2019-07-27 NOTE — Telephone Encounter (Signed)
See if he still needs something for headaches or if he was able to use the savings card.

## 2019-07-27 NOTE — Telephone Encounter (Signed)
Tried to reach the pt by telephone.  Received a message that the voicemail box is full and cannot accept messages.  Will try again at a later time.

## 2019-07-29 NOTE — Telephone Encounter (Signed)
Spoke to the pt on 07/28/19.  He has not printed the saving card from the Internet but will do so due to his insurance denying coverage of Nurtec.  He will call back if there is a problem using the savings card.  Nothing further needed.

## 2019-08-11 ENCOUNTER — Ambulatory Visit (INDEPENDENT_AMBULATORY_CARE_PROVIDER_SITE_OTHER): Payer: 59 | Admitting: Adult Health

## 2019-08-11 ENCOUNTER — Other Ambulatory Visit: Payer: Self-pay

## 2019-08-11 ENCOUNTER — Encounter: Payer: Self-pay | Admitting: Adult Health

## 2019-08-11 VITALS — BP 122/80 | Temp 99.3°F | Wt 261.0 lb

## 2019-08-11 DIAGNOSIS — E1165 Type 2 diabetes mellitus with hyperglycemia: Secondary | ICD-10-CM

## 2019-08-11 LAB — POCT GLYCOSYLATED HEMOGLOBIN (HGB A1C): HbA1c, POC (controlled diabetic range): 7.7 % — AB (ref 0.0–7.0)

## 2019-08-11 MED ORDER — EMPAGLIFLOZIN 25 MG PO TABS: 25.0000 mg | ORAL_TABLET | Freq: Every day | ORAL | 0 refills | Status: DC

## 2019-08-11 NOTE — Progress Notes (Signed)
Subjective:    Patient ID: Joseph Barry, male    DOB: [birth year], 39 y.o.   MRN: [id number]  HPI  39 year old male who  has a past medical history of Depression, Diabetes mellitus type II, uncontrolled (HCC), Eczema, Finger laceration involving tendon (05/20/2014), GERD (gastroesophageal reflux disease), History of cardiac murmur as a child, History of MRSA infection (~ 2007), Hypertension, Migraines, and Sleep apnea.  He presents to the office today for three month follow up regarding DM. He is currently prescribed Metformin 500 mg BID and glipizide 10 mg ER daily. He has been uncontrolled for awhile due to inconsistencies in diet and medication adherence. During his last visit in 05/2019 he was eating out more but was taking his medications nearly every day. He was working long hours and not exercising on a routine basis. His A1c was 12.2. He has been unwilling to go on insulin for some time now. Trulicity was not covered by insurance and he was prescribed Jardiance 10 mg.    He reports that he is exercising more often, riding his bike 30 minutes a time a few times a week, eating healthier and has cut back on alcohol consumption. He is taking his medications daily.   He enjoys using the [facility] system but has not been back to pick up his new prescription. He was seeing blood sugars in the 90's prior to eating but " then would go sky high".   Lab Results  Component Value Date   HGBA1C 12.2 (A) 05/11/2019   Wt Readings from Last 3 Encounters:  08/11/19 261 lb (118.4 kg)  07/16/19 264 lb (119.7 kg)  05/11/19 263 lb (119.3 kg)    Review of Systems See HPI   Past Medical History:  Diagnosis Date  . Depression   . Diabetes mellitus type II, uncontrolled (HCC)   . Eczema   . Finger laceration involving tendon 05/20/2014   right index, long, ring, small fingers - tendon/artery/nerve lac.  Marland Kitchen GERD (gastroesophageal reflux disease)   . History of cardiac murmur as a child   . History of  MRSA infection ~ 2007   back  . Hypertension    states under control with med., has been on med. x 1 yr.  . Migraines   . Sleep apnea    uses CPAP nightly    Social History   Socioeconomic History  . Marital status: Married    Spouse name: Not on file  . Number of children: Not on file  . Years of education: Not on file  . Highest education level: Not on file  Occupational History  . Not on file  Tobacco Use  . Smoking status: Current Every Day Smoker    Years: 5.00    Types: Cigars  . Smokeless tobacco: Never Used  . Tobacco comment: 5 cigars/day  Substance and Sexual Activity  . Alcohol use: Yes    Alcohol/week: 0.0 standard drinks    Comment: occasionally  . Drug use: No  . Sexual activity: Not on file  Other Topics Concern  . 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    Married for five years   Has step daughter.    Has rabbit and dog.    Social Determinants of Health   Financial Resource Strain:   . Difficulty of Paying Living Expenses:   Food Insecurity:   . Worried About Programme researcher, broadcasting/film/video in the Last  Year:   . Ran Out of Food in the Last Year:   Transportation Needs:   . Freight forwarder (Medical):   Marland Kitchen Lack of Transportation (Non-Medical):   Physical Activity:   . Days of Exercise per Week:   . Minutes of Exercise per Session:   Stress:   . Feeling of Stress :   Social Connections:   . Frequency of Communication with Friends and Family:   . Frequency of Social Gatherings with Friends and Family:   . Attends Religious Services:   . Active Member of Clubs or Organizations:   . Attends Banker Meetings:   Marland Kitchen Marital Status:   Intimate Partner Violence:   . Fear of Current or Ex-Partner:   . Emotionally Abused:   Marland Kitchen Physically Abused:   . Sexually Abused:     Past Surgical History:  Procedure Laterality Date  . NERVE, TENDON AND ARTERY REPAIR Right 05/24/2014    Procedure: RIGHT INDEX LONG RING SMALL/REPAIR TENDON, ARTERY NERVE;  Surgeon: Betha Loa, MD;  Location: [facility];  Service: Orthopedics;  Laterality: Right;  . ORBITAL FRACTURE SURGERY Left 2003    Family History  Problem Relation Age of Onset  . Arthritis Maternal Grandmother   . Diabetes Maternal Grandmother   . Heart disease Maternal Grandmother   . Hypertension Father   . Diabetes Father   . Colon polyps Father   . Hypertension Mother   . Heart disease Mother   . COPD Mother   . Cancer Paternal Grandmother        spine cancer  . Hypertension Sister     Allergies  Allergen Reactions  . Shellfish Allergy Shortness Of Breath and Swelling    Current Outpatient Medications on File Prior to Visit  Medication Sig Dispense Refill  . Continuous Blood Gluc Receiver (FREESTYLE LIBRE 14 DAY READER) DEVI USE ONE DEVICE EVERY 14 DAYS TO MONITOR BLOOD GLUCOSE 6 each 3  . Continuous Blood Gluc Sensor (FREESTYLE LIBRE 14 DAY SENSOR) MISC USE ONE DEVICE EVERY 14 DAYS TO MONITOR BLOOD GLUCOSE 6 each 3  . glipiZIDE (GLUCOTROL XL) 10 MG 24 hr tablet Take 1 tablet (10 mg total) by mouth daily with breakfast. 90 tablet 0  . glucose blood (ONETOUCH VERIO) test strip USE TO TEST BLOOD GLUCOSE THREE TIMES DAILY. 300 each 3  . LITHIUM PO Take 2,000 mg by mouth daily. OTC    . Magnesium 250 MG TABS Take 1 tablet by mouth daily.    . metFORMIN (GLUCOPHAGE) 500 MG tablet TAKE 1 TABLET BY MOUTH  TWICE DAILY WITH MEALS 180 tablet 0  . OneTouch Delica Lancets 30G MISC 1 Device by Does not apply route 3 (three) times daily. 300 each 3  . POTASSIUM PO Take 250 mg by mouth daily.    . Probiotic Product (PROBIOTIC PO) Take by mouth.    . simvastatin (ZOCOR) 20 MG tablet Take 1 tablet (20 mg total) by mouth at bedtime. 90 tablet 2  . amitriptyline (ELAVIL) 25 MG tablet Take 1 tablet (25 mg total) by mouth at bedtime. 90 tablet 3  . Rimegepant Sulfate 75 MG TBDP Take 75 mg by mouth daily as  needed. (Patient not taking: Reported on 08/11/2019) 30 tablet 2   No current facility-administered medications on file prior to visit.    BP 122/80   Temp 99.3 F (37.4 C)   Wt 261 lb (118.4 kg)   BMI 37.99 kg/m  Objective:   Physical Exam Vitals and nursing note reviewed.  Constitutional:      Appearance: Normal appearance. He is obese.  Musculoskeletal:        General: Normal range of motion.  Skin:    General: Skin is warm and dry.  Neurological:     General: No focal deficit present.     Mental Status: He is alert and oriented to person, place, and time.  Psychiatric:        Mood and Affect: Mood normal.        Behavior: Behavior normal.        Thought Content: Thought content normal.        Judgment: Judgment normal.        Assessment & Plan:  1. Uncontrolled type 2 diabetes mellitus with hyperglycemia (HCC)  - POCT A1C- 7.7 - has improved but not at goal - Congratulated on lifestyle modifications - Will increase Jardiance to 25 mg.  - Follow up in 3 months or sooner if needed - empagliflozin (JARDIANCE) 25 MG TABS tablet; Take 1 tablet (25 mg total) by mouth daily.  Dispense: 90 tablet; Refill: 0  Shirline Frees, NP

## 2019-09-20 ENCOUNTER — Other Ambulatory Visit: Payer: Self-pay | Admitting: Adult Health

## 2019-09-20 DIAGNOSIS — E1165 Type 2 diabetes mellitus with hyperglycemia: Secondary | ICD-10-CM

## 2019-10-17 ENCOUNTER — Other Ambulatory Visit: Payer: Self-pay | Admitting: Adult Health

## 2019-10-17 DIAGNOSIS — E1165 Type 2 diabetes mellitus with hyperglycemia: Secondary | ICD-10-CM

## 2019-11-11 ENCOUNTER — Ambulatory Visit (INDEPENDENT_AMBULATORY_CARE_PROVIDER_SITE_OTHER): Payer: 59 | Admitting: Adult Health

## 2019-11-11 ENCOUNTER — Other Ambulatory Visit: Payer: Self-pay

## 2019-11-11 VITALS — BP 124/74 | HR 77 | Temp 98.9°F | Ht 69.5 in | Wt 262.2 lb

## 2019-11-11 DIAGNOSIS — E1165 Type 2 diabetes mellitus with hyperglycemia: Secondary | ICD-10-CM | POA: Diagnosis not present

## 2019-11-11 LAB — POCT GLYCOSYLATED HEMOGLOBIN (HGB A1C): Hemoglobin A1C: 6.7 % — AB (ref 4.0–5.6)

## 2019-11-11 NOTE — Progress Notes (Signed)
Subjective:    Patient ID: Joseph Barry, male    DOB: [birth year], 39 y.o.   MRN: [id number]  HPI 39 year old male who  has a past medical history of Depression, Diabetes mellitus type II, uncontrolled (HCC), Eczema, Finger laceration involving tendon (05/20/2014), GERD (gastroesophageal reflux disease), History of cardiac murmur as a child, History of MRSA infection (~ 2007), Hypertension, Migraines, and Sleep apnea.  Presents to the office today for 88-month follow-up regarding diabetes mellitus.  He is currently prescribed Metformin 500 mg twice daily Jardiance 25 mg daily,  and glipizide 10 mg ER daily.  In the past he was uncontrolled for quite some time due to inconsistencies in diet and medication adherence.  During his last visit his A1c had dropped to 7.7 from 12.2.  At this time we had started [place], he was using the [facility] system, and he had been working on lifestyle modifications by riding his bike 30 minutes a time a few times a week and eating healthier.  He had also cut back on alcohol consumption and was taking his medications as prescribed.  Per patient over the last 3 months he has not picked up his Josephine Igo system so he has not been checking his blood sugars.  His diet has regressed and he is back to eating candy and fast food, when he does eat and his alcohol consumption has increased.  He is not exercising as much.  Review of Systems See HPI   Past Medical History:  Diagnosis Date   Depression    Diabetes mellitus type II, uncontrolled (HCC)    Eczema    Finger laceration involving tendon 05/20/2014   right index, long, ring, small fingers - tendon/artery/nerve lac.   GERD (gastroesophageal reflux disease)    History of cardiac murmur as a child    History of MRSA infection ~ 2007   back   Hypertension    states under control with med., has been on med. x 1 yr.   Migraines    Sleep apnea    uses CPAP nightly    Social History   Socioeconomic History    Marital status: Married    Spouse name: Not on file   Number of children: Not on file   Years of education: Not on file   Highest education level: Not on file  Occupational History   Not on file  Tobacco Use   Smoking status: Current Every Day Smoker    Years: 5.00    Types: Cigars   Smokeless tobacco: Never Used   Tobacco comment: 5 cigars/day  Substance and Sexual Activity   Alcohol use: Yes    Alcohol/week: 0.0 standard drinks    Comment: occasionally   Drug use: No   Sexual activity: Not on file  Other Topics Concern   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    Married for five years   Has step daughter.    Has rabbit and dog.    Social Determinants of Health   Financial Resource Strain:    Difficulty of Paying Living Expenses: Not on file  Food Insecurity:    Worried About Programme researcher, broadcasting/film/video in the Last Year: Not on file   [employer] of Food in the Last Year: Not on file  Transportation Needs:    Lack of Transportation (Medical): Not on file   Lack of Transportation (Non-Medical): Not on file  Physical Activity:  Days of Exercise per Week: Not on file   Minutes of Exercise per Session: Not on file  Stress:    Feeling of Stress : Not on file  Social Connections:    Frequency of Communication with Friends and Family: Not on file   Frequency of Social Gatherings with Friends and Family: Not on file   Attends Religious Services: Not on file   Active Member of Clubs or Organizations: Not on file   Attends Banker Meetings: Not on file   Marital Status: Not on file  Intimate Partner Violence:    Fear of Current or Ex-Partner: Not on file   Emotionally Abused: Not on file   Physically Abused: Not on file   Sexually Abused: Not on file    Past Surgical History:  Procedure Laterality Date   NERVE, TENDON AND ARTERY REPAIR Right 05/24/2014   Procedure:  RIGHT INDEX LONG RING SMALL/REPAIR TENDON, ARTERY NERVE;  Surgeon: Betha Loa, MD;  Location: [facility];  Service: Orthopedics;  Laterality: Right;   ORBITAL FRACTURE SURGERY Left 2003    Family History  Problem Relation Age of Onset   Arthritis Maternal Grandmother    Diabetes Maternal Grandmother    Heart disease Maternal Grandmother    Hypertension Father    Diabetes Father    Colon polyps Father    Hypertension Mother    Heart disease Mother    COPD Mother    Cancer Paternal Grandmother        spine cancer   Hypertension Sister     Allergies  Allergen Reactions   Shellfish Allergy Shortness Of Breath and Swelling    Current Outpatient Medications on File Prior to Visit  Medication Sig Dispense Refill   glipiZIDE (GLUCOTROL XL) 10 MG 24 hr tablet TAKE 1 TABLET BY MOUTH  DAILY WITH BREAKFAST 90 tablet 3   JARDIANCE 25 MG TABS tablet TAKE 1 TABLET BY MOUTH  DAILY 90 tablet 3   Magnesium 250 MG TABS Take 1 tablet by mouth daily.     metFORMIN (GLUCOPHAGE) 500 MG tablet TAKE 1 TABLET BY MOUTH  TWICE DAILY WITH MEALS 180 tablet 3   POTASSIUM PO Take 250 mg by mouth daily.     Probiotic Product (PROBIOTIC PO) Take by mouth.     simvastatin (ZOCOR) 20 MG tablet Take 1 tablet (20 mg total) by mouth at bedtime. 90 tablet 2   amitriptyline (ELAVIL) 25 MG tablet Take 1 tablet (25 mg total) by mouth at bedtime. 90 tablet 3   Continuous Blood Gluc Receiver (FREESTYLE LIBRE 14 DAY READER) DEVI USE ONE DEVICE EVERY 14 DAYS TO MONITOR BLOOD GLUCOSE (Patient not taking: Reported on 11/11/2019) 6 each 3   Continuous Blood Gluc Sensor (FREESTYLE LIBRE 14 DAY SENSOR) MISC USE ONE DEVICE EVERY 14 DAYS TO MONITOR BLOOD GLUCOSE (Patient not taking: Reported on 11/11/2019) 6 each 3   glucose blood (ONETOUCH VERIO) test strip USE TO TEST BLOOD GLUCOSE THREE TIMES DAILY. (Patient not taking: Reported on 11/11/2019) 300 each 3   OneTouch Delica Lancets 30G MISC 1  Device by Does not apply route 3 (three) times daily. (Patient not taking: Reported on 11/11/2019) 300 each 3   No current facility-administered medications on file prior to visit.    BP 124/74 (BP Location: Right Arm, Patient Position: Sitting, Cuff Size: Large)    Pulse 77    Temp 98.9 F (37.2 C) (Oral)    Ht 5' 9.5" (1.765 m)  Wt 262 lb 3.2 oz (118.9 kg)    SpO2 99%    BMI 38.17 kg/m       Objective:   Physical Exam Vitals and nursing note reviewed.  Constitutional:      Appearance: Normal appearance. He is obese.  Pulmonary:     Effort: Pulmonary effort is normal.  Skin:    General: Skin is warm and dry.     Capillary Refill: Capillary refill takes less than 2 seconds.  Neurological:     General: No focal deficit present.     Mental Status: He is alert and oriented to person, place, and time.  Psychiatric:        Mood and Affect: Mood normal.        Behavior: Behavior normal.        Thought Content: Thought content normal.        Judgment: Judgment normal.       Assessment & Plan:  1. Uncontrolled type 2 diabetes mellitus with hyperglycemia (HCC) Apprising Lee, his A1c has improved to 6.7.  He was encouraged to pick up his Josephine Igo system, get back on his diet, and decrease alcohol consumption as well as start exercising again.  We will see him back in 3 months for his physical exam - POCT glycosylated hemoglobin (Hb A1C)- 6.7   Shirline Frees, NP

## 2019-11-19 ENCOUNTER — Encounter: Payer: Self-pay | Admitting: Adult Health

## 2019-12-08 ENCOUNTER — Other Ambulatory Visit: Payer: Self-pay | Admitting: Chiropractic Medicine

## 2019-12-08 DIAGNOSIS — M545 Low back pain, unspecified: Secondary | ICD-10-CM

## 2019-12-26 ENCOUNTER — Ambulatory Visit
Admission: RE | Admit: 2019-12-26 | Discharge: 2019-12-26 | Disposition: A | Discharge status: Home / Self Care | Payer: 59 | Source: Ambulatory Visit | Attending: Chiropractic Medicine | Admitting: Chiropractic Medicine

## 2019-12-26 DIAGNOSIS — M545 Low back pain, unspecified: Secondary | ICD-10-CM

## 2020-01-21 ENCOUNTER — Other Ambulatory Visit: Payer: Self-pay | Admitting: Adult Health

## 2020-01-21 DIAGNOSIS — E1165 Type 2 diabetes mellitus with hyperglycemia: Secondary | ICD-10-CM

## 2020-01-25 NOTE — Telephone Encounter (Signed)
SENT TO THE PHARMACY BY E-SCRIBE. 

## 2020-02-11 ENCOUNTER — Ambulatory Visit (INDEPENDENT_AMBULATORY_CARE_PROVIDER_SITE_OTHER): Payer: 59 | Admitting: Adult Health

## 2020-02-11 ENCOUNTER — Encounter: Payer: Self-pay | Admitting: Adult Health

## 2020-02-11 ENCOUNTER — Other Ambulatory Visit: Payer: Self-pay

## 2020-02-11 VITALS — BP 118/84 | Temp 98.9°F | Ht 69.5 in | Wt 252.0 lb

## 2020-02-11 DIAGNOSIS — E1165 Type 2 diabetes mellitus with hyperglycemia: Secondary | ICD-10-CM

## 2020-02-11 DIAGNOSIS — G43011 Migraine without aura, intractable, with status migrainosus: Secondary | ICD-10-CM | POA: Diagnosis not present

## 2020-02-11 DIAGNOSIS — E66813 Obesity, class 3: Secondary | ICD-10-CM

## 2020-02-11 DIAGNOSIS — Z6841 Body Mass Index (BMI) 40.0 and over, adult: Secondary | ICD-10-CM

## 2020-02-11 DIAGNOSIS — Z0000 Encounter for general adult medical examination without abnormal findings: Secondary | ICD-10-CM | POA: Diagnosis not present

## 2020-02-11 DIAGNOSIS — E782 Mixed hyperlipidemia: Secondary | ICD-10-CM

## 2020-02-11 DIAGNOSIS — K5792 Diverticulitis of intestine, part unspecified, without perforation or abscess without bleeding: Secondary | ICD-10-CM

## 2020-02-11 LAB — CBC WITH DIFFERENTIAL/PLATELET
Basophils Absolute: 0 10*3/uL (ref 0.0–0.1)
Basophils Relative: 0.7 % (ref 0.0–3.0)
Eosinophils Absolute: 0.1 10*3/uL (ref 0.0–0.7)
Eosinophils Relative: 1.5 % (ref 0.0–5.0)
HCT: 47.5 % (ref 39.0–52.0)
Hemoglobin: 16.3 g/dL (ref 13.0–17.0)
Lymphocytes Relative: 27.4 % (ref 12.0–46.0)
Lymphs Abs: 1.8 10*3/uL (ref 0.7–4.0)
MCHC: 34.3 g/dL (ref 30.0–36.0)
MCV: 91 fl (ref 78.0–100.0)
Monocytes Absolute: 0.4 10*3/uL (ref 0.1–1.0)
Monocytes Relative: 6.2 % (ref 3.0–12.0)
Neutro Abs: 4.2 10*3/uL (ref 1.4–7.7)
Neutrophils Relative %: 64.2 % (ref 43.0–77.0)
Platelets: 429 10*3/uL — ABNORMAL HIGH (ref 150.0–400.0)
RBC: 5.22 Mil/uL (ref 4.22–5.81)
RDW: 13.4 % (ref 11.5–15.5)
WBC: 6.5 10*3/uL (ref 4.0–10.5)

## 2020-02-11 LAB — COMPREHENSIVE METABOLIC PANEL
ALT: 11 U/L (ref 0–53)
AST: 12 U/L (ref 0–37)
Albumin: 4.3 g/dL (ref 3.5–5.2)
Alkaline Phosphatase: 65 U/L (ref 39–117)
BUN: 13 mg/dL (ref 6–23)
CO2: 28 mEq/L (ref 19–32)
Calcium: 10.1 mg/dL (ref 8.4–10.5)
Chloride: 106 mEq/L (ref 96–112)
Creatinine, Ser: 1.13 mg/dL (ref 0.40–1.50)
GFR: 81.76 mL/min (ref >60.00)
Glucose, Bld: 119 mg/dL — ABNORMAL HIGH (ref 70–99)
Potassium: 4.6 mEq/L (ref 3.5–5.1)
Sodium: 140 mEq/L (ref 135–145)
Total Bilirubin: 0.5 mg/dL (ref 0.2–1.2)
Total Protein: 7 g/dL (ref 6.0–8.3)

## 2020-02-11 LAB — MICROALBUMIN / CREATININE URINE RATIO
Creatinine,U: 179.6 mg/dL
Microalb Creat Ratio: 0.6 mg/g (ref 0.0–30.0)
Microalb, Ur: 1.2 mg/dL (ref 0.0–1.9)

## 2020-02-11 LAB — HEMOGLOBIN A1C: Hgb A1c MFr Bld: 7 % — ABNORMAL HIGH (ref 4.6–6.5)

## 2020-02-11 LAB — TSH: TSH: 1.32 u[IU]/mL (ref 0.35–4.50)

## 2020-02-11 LAB — LIPID PANEL
Cholesterol: 189 mg/dL (ref 0–200)
HDL: 37.3 mg/dL — ABNORMAL LOW (ref >39.00)
LDL Cholesterol: 134 mg/dL — ABNORMAL HIGH (ref 0–99)
NonHDL: 151.5
Total CHOL/HDL Ratio: 5
Triglycerides: 88 mg/dL (ref 0.0–149.0)
VLDL: 17.6 mg/dL (ref 0.0–40.0)

## 2020-02-11 MED ORDER — METRONIDAZOLE 500 MG PO TABS: 500.0000 mg | ORAL_TABLET | Freq: Three times a day (TID) | ORAL | 0 refills | Status: DC

## 2020-02-11 MED ORDER — CIPROFLOXACIN HCL 500 MG PO TABS: 500.0000 mg | ORAL_TABLET | Freq: Two times a day (BID) | ORAL | 0 refills | Status: AC

## 2020-02-11 MED ORDER — FREESTYLE LIBRE 2 SENSOR MISC: 0 refills | Status: DC

## 2020-02-11 NOTE — Progress Notes (Signed)
Subjective:    Patient ID: Joseph Barry, male    DOB: 1980/10/27, 40 y.o.   MRN: [id number]  HPI Patient presents for yearly preventative medicine examination. He is a pleasant 40 year old male who  has a past medical history of Depression, Diabetes mellitus type II, uncontrolled (HCC), Eczema, Finger laceration involving tendon (05/20/2014), GERD (gastroesophageal reflux disease), History of cardiac murmur as a child, History of MRSA infection (~ 2007), Hypertension, Migraines, and Sleep apnea.  DM -Long history of uncontrolled diabetes due to noncompliance with medication and lifestyle modifications.  Currently prescribed Metformin 500 mg twice daily, Jardiance 25 mg daily, and glipizide 10 mg extended release daily.  He has been taking his medications every day and continues to use the [facility] system to help manage his blood sugars.  Per his Joseph Barry app he is in his target 94% of the time.  His average glucose over the last 90 days has been 120. Lab Results  Component Value Date   HGBA1C 6.7 (A) 11/11/2019   Hyperlipidemia -prescribed simvastatin 20 mg.  He denies myalgia or fatigue Lab Results  Component Value Date   CHOL 208 (H) 02/11/2019   HDL 38.50 (L) 02/11/2019   LDLCALC 146 (H) 02/11/2019   TRIG 117.0 02/11/2019   CHOLHDL 5 02/11/2019   Migraine Headaches - takes Elavil 25 mg QHS - denies frequent migraine headaches   Depression -does feel as though his depression is "managed" without medication.  PHQ-9 score of 14 today.  Has episodes of passive suicidal ideation but no plan.  Does not want to go on medication at this point in time.  Diverticulitis -infrequent flares, but reports that over the last 2 to 3 days he has had pain in his left lower quadrant that has been consistent with past diverticulitis flares.  Had a low-grade fever for about 24 hours this week.  Does endorse more frequent stools but not diarrhea.  All immunizations and health maintenance protocols were reviewed  with the patient and needed orders were placed.  Appropriate screening laboratory values were ordered for the patient including screening of hyperlipidemia, renal function and hepatic function.  Medication reconciliation,  past medical history, social history, problem list and allergies were reviewed in detail with the patient  Goals were established with regard to weight loss, exercise, and  diet in compliance with medications  Review of Systems  Constitutional: Negative.   HENT: Negative.   Eyes: Negative.   Respiratory: Negative.   Cardiovascular: Negative.   Gastrointestinal: Positive for abdominal pain. Negative for anal bleeding, blood in stool and diarrhea.  Endocrine: Negative.   Genitourinary: Negative.   Musculoskeletal: Negative.   Skin: Negative.   Allergic/Immunologic: Negative.   Neurological: Negative.   Hematological: Negative.   Psychiatric/Behavioral: Positive for dysphoric mood.  All other systems reviewed and are negative.  Past Medical History:  Diagnosis Date  . Depression   . Diabetes mellitus type II, uncontrolled (HCC)   . Eczema   . Finger laceration involving tendon 05/20/2014   right index, long, ring, small fingers - tendon/artery/nerve lac.  Marland Kitchen GERD (gastroesophageal reflux disease)   . History of cardiac murmur as a child   . History of MRSA infection ~ 2007   back  . Hypertension    states under control with med., has been on med. x 1 yr.  . Migraines   . Sleep apnea    uses CPAP nightly    Social History   Socioeconomic History  . Marital status:  Married    Spouse name: Not on file  . Number of children: Not on file  . Years of education: Not on file  . Highest education level: Not on file  Occupational History  . Not on file  Tobacco Use  . Smoking status: Current Every Day Smoker    Years: 5.00    Types: Cigars  . Smokeless tobacco: Never Used  . Tobacco comment: 5 cigars/day  Substance and Sexual Activity  . Alcohol use:  Yes    Alcohol/week: 0.0 standard drinks    Comment: occasionally  . Drug use: No  . Sexual activity: Not on file  Other Topics Concern  . 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    Married for five years   Has step daughter.    Has rabbit and dog.    Social Determinants of Health   Financial Resource Strain: Not on file  Food Insecurity: Not on file  Transportation Needs: Not on file  Physical Activity: Not on file  Stress: Not on file  Social Connections: Not on file  Intimate Partner Violence: Not on file    Past Surgical History:  Procedure Laterality Date  . NERVE, TENDON AND ARTERY REPAIR Right 05/24/2014   Procedure: RIGHT INDEX LONG RING SMALL/REPAIR TENDON, ARTERY NERVE;  Surgeon: Betha Loa, MD;  Location: [facility];  Service: Orthopedics;  Laterality: Right;  . ORBITAL FRACTURE SURGERY Left 2003    Family History  Problem Relation Age of Onset  . Arthritis Maternal Grandmother   . Diabetes Maternal Grandmother   . Heart disease Maternal Grandmother   . Hypertension Father   . Diabetes Father   . Colon polyps Father   . Hypertension Mother   . Heart disease Mother   . COPD Mother   . Cancer Paternal Grandmother        spine cancer  . Hypertension Sister     Allergies  Allergen Reactions  . Shellfish Allergy Shortness Of Breath and Swelling    Current Outpatient Medications on File Prior to Visit  Medication Sig Dispense Refill  . Continuous Blood Gluc Receiver (FREESTYLE LIBRE 14 DAY READER) DEVI USE ONE DEVICE EVERY 14 DAYS TO MONITOR BLOOD GLUCOSE 6 each 3  . glipiZIDE (GLUCOTROL XL) 10 MG 24 hr tablet TAKE 1 TABLET BY MOUTH  DAILY WITH BREAKFAST 90 tablet 3  . glucose blood (ONETOUCH VERIO) test strip USE TO TEST BLOOD GLUCOSE 3 TIMES DAILY 300 strip 3  . JARDIANCE 25 MG TABS tablet TAKE 1 TABLET BY MOUTH  DAILY 90 tablet 3  . Magnesium 250 MG TABS Take 1 tablet by  mouth daily.    . metFORMIN (GLUCOPHAGE) 500 MG tablet TAKE 1 TABLET BY MOUTH  TWICE DAILY WITH MEALS 180 tablet 3  . OneTouch Delica Lancets 30G MISC 1 Device by Does not apply route 3 (three) times daily. 300 each 3  . POTASSIUM PO Take 250 mg by mouth daily.    . Probiotic Product (PROBIOTIC PO) Take by mouth.    . simvastatin (ZOCOR) 20 MG tablet Take 1 tablet (20 mg total) by mouth at bedtime. 90 tablet 2  . amitriptyline (ELAVIL) 25 MG tablet Take 1 tablet (25 mg total) by mouth at bedtime. 90 tablet 3   No current facility-administered medications on file prior to visit.    BP 118/84   Temp 98.9 F (37.2 C)   Ht 5' 9.5" (  1.765 m) Comment: WITH SHOES  Wt 252 lb (114.3 kg)   BMI 36.68 kg/m       Objective:   Physical Exam Vitals and nursing note reviewed.  Constitutional:      General: He is not in acute distress.    Appearance: Normal appearance. He is well-developed. He is obese.  HENT:     Head: Normocephalic and atraumatic.     Right Ear: Tympanic membrane, ear canal and external ear normal. There is no impacted cerumen.     Left Ear: Tympanic membrane, ear canal and external ear normal. There is no impacted cerumen.     Nose: Nose normal. No congestion or rhinorrhea.     Mouth/Throat:     Mouth: Mucous membranes are moist.     Pharynx: Oropharynx is clear. No oropharyngeal exudate or posterior oropharyngeal erythema.  Eyes:     General:        Right eye: No discharge.        Left eye: No discharge.     Extraocular Movements: Extraocular movements intact.     Conjunctiva/sclera: Conjunctivae normal.     Pupils: Pupils are equal, round, and reactive to light.  Neck:     Vascular: No carotid bruit.     Trachea: No tracheal deviation.  Cardiovascular:     Rate and Rhythm: Normal rate and regular rhythm.     Pulses: Normal pulses.     Heart sounds: Normal heart sounds. No murmur heard. No friction rub. No gallop.   Pulmonary:     Effort: Pulmonary effort is  normal. No respiratory distress.     Breath sounds: Normal breath sounds. No stridor. No wheezing, rhonchi or rales.  Chest:     Chest wall: No tenderness.  Abdominal:     General: Bowel sounds are normal. There is no distension.     Palpations: Abdomen is soft. There is no mass.     Tenderness: There is abdominal tenderness in the left lower quadrant. There is no right CVA tenderness, left CVA tenderness, guarding or rebound.     Hernia: No hernia is present.  Musculoskeletal:        General: No swelling, tenderness, deformity or signs of injury. Normal range of motion.     Right lower leg: No edema.     Left lower leg: No edema.  Lymphadenopathy:     Cervical: No cervical adenopathy.  Skin:    General: Skin is warm and dry.     Capillary Refill: Capillary refill takes less than 2 seconds.     Coloration: Skin is not jaundiced or pale.     Findings: No bruising, erythema, lesion or rash.  Neurological:     General: No focal deficit present.     Mental Status: He is alert and oriented to person, place, and time.     Cranial Nerves: No cranial nerve deficit.     Sensory: No sensory deficit.     Motor: No weakness.     Coordination: Coordination normal.     Gait: Gait normal.     Deep Tendon Reflexes: Reflexes normal.  Psychiatric:        Mood and Affect: Mood normal.        Behavior: Behavior normal.        Thought Content: Thought content normal.        Judgment: Judgment normal.       Assessment & Plan:  1. Routine general medical examination at a health care  facility - Follow up in one year or sooner if needed - Needs weight loss through lifestyle modifications  - Microalbumin/Creatinine Ratio, Urine; Future - CBC with Differential/Platelet; Future - Comprehensive metabolic panel; Future - Hemoglobin A1c; Future - Lipid panel; Future - TSH; Future - TSH - Microalbumin/Creatinine Ratio, Urine - CBC with Differential/Platelet - Comprehensive metabolic panel -  Hemoglobin A1c - Lipid panel  2. Uncontrolled type 2 diabetes mellitus with hyperglycemia (HCC) - Consider adding agent but blood sugars appear better controlled.  - Continuous Blood Gluc Sensor (FREESTYLE LIBRE 2 SENSOR) MISC; Use with libre app  Dispense: 6 each; Refill: 0 - Microalbumin/Creatinine Ratio, Urine; Future - CBC with Differential/Platelet; Future - Comprehensive metabolic panel; Future - Hemoglobin A1c; Future - Lipid panel; Future - TSH; Future - TSH - Microalbumin/Creatinine Ratio, Urine - CBC with Differential/Platelet - Comprehensive metabolic panel - Hemoglobin A1c - Lipid panel  3. Mixed hyperlipidemia - Consider increase in statin  - Microalbumin/Creatinine Ratio, Urine; Future - CBC with Differential/Platelet; Future - Comprehensive metabolic panel; Future - Hemoglobin A1c; Future - Lipid panel; Future - TSH; Future - TSH - Microalbumin/Creatinine Ratio, Urine - CBC with Differential/Platelet - Comprehensive metabolic panel - Hemoglobin A1c - Lipid panel  4. Class 3 severe obesity with serious comorbidity and body mass index (BMI) of 40.0 to 44.9 in adult, unspecified obesity type (HCC) - Encouraged weight loss through diet and exercise - Microalbumin/Creatinine Ratio, Urine; Future - CBC with Differential/Platelet; Future - Comprehensive metabolic panel; Future - Hemoglobin A1c; Future - Lipid panel; Future - TSH; Future - TSH - Microalbumin/Creatinine Ratio, Urine - CBC with Differential/Platelet - Comprehensive metabolic panel - Hemoglobin A1c - Lipid panel  5. Intractable migraine without aura and with status migrainosus - Continue with Elavil 25 mg  - Microalbumin/Creatinine Ratio, Urine; Future - CBC with Differential/Platelet; Future - Comprehensive metabolic panel; Future - Hemoglobin A1c; Future - Lipid panel; Future - TSH; Future - TSH - Microalbumin/Creatinine Ratio, Urine - CBC with Differential/Platelet - Comprehensive  metabolic panel - Hemoglobin A1c - Lipid panel  6. Diverticulitis -We will treat for suspected diverticulitis.  Advise follow-up if symptoms do not resolve by the end of his antibiotic therapy or sooner if symptoms worsen - metroNIDAZOLE (FLAGYL) 500 MG tablet; Take 1 tablet (500 mg total) by mouth 3 (three) times daily.  Dispense: 21 tablet; Refill: 0 - ciprofloxacin (CIPRO) 500 MG tablet; Take 1 tablet (500 mg total) by mouth 2 (two) times daily for 7 days.  Dispense: 14 tablet; Refill: 0   Shirline Frees, NP

## 2020-02-11 NOTE — Patient Instructions (Signed)
It was great seeing you today   We will follow up with you regarding your blood work   Please follow up in three months or sooner if needed

## 2020-02-15 ENCOUNTER — Other Ambulatory Visit: Payer: Self-pay | Admitting: Adult Health

## 2020-02-15 DIAGNOSIS — G43011 Migraine without aura, intractable, with status migrainosus: Secondary | ICD-10-CM

## 2020-02-16 ENCOUNTER — Telehealth: Payer: Self-pay | Admitting: Family Medicine

## 2020-02-16 NOTE — Telephone Encounter (Signed)
SENT TO THE PHARMACY BY E-SCRIBE. 

## 2020-02-16 NOTE — Telephone Encounter (Signed)
A PA has been submitted to Cover My Meds.  Waiting on a determination.  KeyScarlette Barry - PA Case ID: [id number]

## 2020-02-18 ENCOUNTER — Other Ambulatory Visit: Payer: Self-pay | Admitting: Adult Health

## 2020-02-18 NOTE — Telephone Encounter (Signed)
Denied on February 10 Request Reference Number: [id number]. [facility] KIT 2 SENSOR is denied for not meeting the prior authorization requirement(s). Details of this decision are in the notice attached below or have been faxed to you. Appeals are not supported through [facility]. Please refer to the fax case notice for appeals information and instructions.

## 2020-02-22 ENCOUNTER — Encounter: Payer: Self-pay | Admitting: Adult Health

## 2020-02-22 DIAGNOSIS — E1165 Type 2 diabetes mellitus with hyperglycemia: Secondary | ICD-10-CM

## 2020-02-22 NOTE — Telephone Encounter (Signed)
Sent to the pharmacy by e-scribe. 

## 2020-03-01 MED ORDER — FREESTYLE LIBRE 2 SENSOR MISC: 3 refills | Status: DC

## 2020-03-01 MED ORDER — FREESTYLE LIBRE 14 DAY READER DEVI: 3 refills | Status: DC

## 2020-05-10 ENCOUNTER — Ambulatory Visit: Payer: 59 | Admitting: Adult Health

## 2020-05-10 DIAGNOSIS — Z0289 Encounter for other administrative examinations: Secondary | ICD-10-CM

## 2020-05-16 ENCOUNTER — Other Ambulatory Visit: Payer: Self-pay

## 2020-05-17 ENCOUNTER — Ambulatory Visit (INDEPENDENT_AMBULATORY_CARE_PROVIDER_SITE_OTHER): Payer: 59 | Admitting: Adult Health

## 2020-05-17 VITALS — BP 122/80 | HR 119 | Temp 98.5°F | Ht 69.5 in | Wt 255.2 lb

## 2020-05-17 DIAGNOSIS — E1165 Type 2 diabetes mellitus with hyperglycemia: Secondary | ICD-10-CM

## 2020-05-17 LAB — POCT GLYCOSYLATED HEMOGLOBIN (HGB A1C): HbA1c, POC (prediabetic range): 6.2 % (ref 5.7–6.4)

## 2020-05-17 NOTE — Progress Notes (Signed)
Subjective:    Patient ID: Joseph Barry, male    DOB: December 15, 1980, 40 y.o.   MRN: [id number]  HPI  40 year old male who  has a past medical history of Depression, Diabetes mellitus type II, uncontrolled (HCC), Eczema, Finger laceration involving tendon (05/20/2014), GERD (gastroesophageal reflux disease), History of cardiac murmur as a child, History of MRSA infection (~ 2007), Hypertension, Migraines, and Sleep apnea.  He presents to the office today for three month follow up regarding DM. He is currently prescribed Metformin 500 mg BID, Jardiance 25 mg daily and glipizide 10 mg ER. He has been using the [facility] system to help manage his blood sugars.  Per his meter, he is spending about 77% of the time in range over the last 90 days, but he is having some episodes of hypoglycemia where his alarm will sound, this is happening more in the evening.  Has been trying to clean up his diet.  Lab Results  Component Value Date   HGBA1C 7.0 (H) 02/11/2020   Wt Readings from Last 3 Encounters:  05/17/20 255 lb 3.2 oz (115.8 kg)  02/11/20 252 lb (114.3 kg)  11/11/19 262 lb 3.2 oz (118.9 kg)     Review of Systems See HPI   Past Medical History:  Diagnosis Date  . Depression   . Diabetes mellitus type II, uncontrolled (HCC)   . Eczema   . Finger laceration involving tendon 05/20/2014   right index, long, ring, small fingers - tendon/artery/nerve lac.  Marland Kitchen GERD (gastroesophageal reflux disease)   . History of cardiac murmur as a child   . History of MRSA infection ~ 2007   back  . Hypertension    states under control with med., has been on med. x 1 yr.  . Migraines   . Sleep apnea    uses CPAP nightly    Social History   Socioeconomic History  . Marital status: Married    Spouse name: Not on file  . Number of children: Not on file  . Years of education: Not on file  . Highest education level: Not on file  Occupational History  . Not on file  Tobacco Use  . Smoking status:  Current Every Day Smoker    Years: 5.00    Types: Cigars  . Smokeless tobacco: Never Used  . Tobacco comment: 5 cigars/day  Substance and Sexual Activity  . Alcohol use: Yes    Alcohol/week: 0.0 standard drinks    Comment: occasionally  . Drug use: No  . Sexual activity: Not on file  Other Topics Concern  . 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    Married for five years   Has step daughter.    Has rabbit and dog.    Social Determinants of Health   Financial Resource Strain: Not on file  Food Insecurity: Not on file  Transportation Needs: Not on file  Physical Activity: Not on file  Stress: Not on file  Social Connections: Not on file  Intimate Partner Violence: Not on file    Past Surgical History:  Procedure Laterality Date  . NERVE, TENDON AND ARTERY REPAIR Right 05/24/2014   Procedure: RIGHT INDEX LONG RING SMALL/REPAIR TENDON, ARTERY NERVE;  Surgeon: Betha Loa, MD;  Location: [facility];  Service: Orthopedics;  Laterality: Right;  . ORBITAL FRACTURE SURGERY Left 2003    Family History  Problem Relation Age of Onset  .  Arthritis Maternal Grandmother   . Diabetes Maternal Grandmother   . Heart disease Maternal Grandmother   . Hypertension Father   . Diabetes Father   . Colon polyps Father   . Hypertension Mother   . Heart disease Mother   . COPD Mother   . Cancer Paternal Grandmother        spine cancer  . Hypertension Sister     Allergies  Allergen Reactions  . Shellfish Allergy Shortness Of Breath and Swelling    Current Outpatient Medications on File Prior to Visit  Medication Sig Dispense Refill  . amitriptyline (ELAVIL) 25 MG tablet TAKE 1 TABLET BY MOUTH AT  BEDTIME 90 tablet 3  . Continuous Blood Gluc Receiver (FREESTYLE LIBRE 14 DAY READER) DEVI USE ONE DEVICE EVERY 14 DAYS TO MONITOR BLOOD GLUCOSE 6 each 3  . Continuous Blood Gluc Sensor (FREESTYLE LIBRE 2 SENSOR)  MISC Use one device every 14 days to monitor blood glucose. 6 each 3  . glipiZIDE (GLUCOTROL XL) 10 MG 24 hr tablet TAKE 1 TABLET BY MOUTH  DAILY WITH BREAKFAST 90 tablet 3  . glucose blood (ONETOUCH VERIO) test strip USE TO TEST BLOOD GLUCOSE 3 TIMES DAILY 300 strip 3  . JARDIANCE 25 MG TABS tablet TAKE 1 TABLET BY MOUTH  DAILY 90 tablet 3  . Lancets (ONETOUCH DELICA PLUS LANCET30G) MISC USE 3 TIMES DAILY 300 each 3  . Magnesium 250 MG TABS Take 1 tablet by mouth daily.    . metFORMIN (GLUCOPHAGE) 500 MG tablet TAKE 1 TABLET BY MOUTH  TWICE DAILY WITH MEALS 180 tablet 3  . metroNIDAZOLE (FLAGYL) 500 MG tablet Take 1 tablet (500 mg total) by mouth 3 (three) times daily. 21 tablet 0  . POTASSIUM PO Take 250 mg by mouth daily.    . Probiotic Product (PROBIOTIC PO) Take by mouth.    . simvastatin (ZOCOR) 20 MG tablet TAKE 1 TABLET BY MOUTH AT  BEDTIME 90 tablet 3   No current facility-administered medications on file prior to visit.    BP 122/80 (BP Location: Left Arm, Patient Position: Sitting, Cuff Size: Normal)   Pulse (!) 119   Temp 98.5 F (36.9 C) (Oral)   Ht 5' 9.5" (1.765 m)   Wt 255 lb 3.2 oz (115.8 kg)   SpO2 96%   BMI 37.15 kg/m       Objective:   Physical Exam Vitals and nursing note reviewed.  Constitutional:      Appearance: Normal appearance.  Musculoskeletal:        General: Normal range of motion.  Skin:    General: Skin is warm and dry.     Capillary Refill: Capillary refill takes less than 2 seconds.  Neurological:     General: No focal deficit present.     Mental Status: He is alert and oriented to person, place, and time.  Psychiatric:        Mood and Affect: Mood normal.        Behavior: Behavior normal.        Thought Content: Thought content normal.        Judgment: Judgment normal.       Assessment & Plan:  1. Uncontrolled type 2 diabetes mellitus with hyperglycemia (HCC)  - POC HgB A1c- 6.2 - at goal and has improved - Will have him d/c  evening dose of Metformin  - Can follow up in 6 months unless he continues to have hypoglycemia  - He knows to restart  metformin and let me know if BS increasing   Shirline Frees, NP

## 2020-05-19 ENCOUNTER — Emergency Department (HOSPITAL_BASED_OUTPATIENT_CLINIC_OR_DEPARTMENT_OTHER)
Admission: EM | Admit: 2020-05-19 | Discharge: 2020-05-19 | Disposition: A | Discharge status: Home / Self Care | Payer: 59 | Attending: Emergency Medicine | Admitting: Emergency Medicine

## 2020-05-19 ENCOUNTER — Other Ambulatory Visit: Payer: Self-pay

## 2020-05-19 ENCOUNTER — Encounter (HOSPITAL_BASED_OUTPATIENT_CLINIC_OR_DEPARTMENT_OTHER): Payer: Self-pay | Admitting: *Deleted

## 2020-05-19 DIAGNOSIS — Z79899 Other long term (current) drug therapy: Secondary | ICD-10-CM | POA: Insufficient documentation

## 2020-05-19 DIAGNOSIS — F17290 Nicotine dependence, other tobacco product, uncomplicated: Secondary | ICD-10-CM | POA: Diagnosis not present

## 2020-05-19 DIAGNOSIS — Z7984 Long term (current) use of oral hypoglycemic drugs: Secondary | ICD-10-CM | POA: Diagnosis not present

## 2020-05-19 DIAGNOSIS — E119 Type 2 diabetes mellitus without complications: Secondary | ICD-10-CM | POA: Diagnosis not present

## 2020-05-19 DIAGNOSIS — I10 Essential (primary) hypertension: Secondary | ICD-10-CM | POA: Insufficient documentation

## 2020-05-19 DIAGNOSIS — Z8719 Personal history of other diseases of the digestive system: Secondary | ICD-10-CM | POA: Insufficient documentation

## 2020-05-19 DIAGNOSIS — R109 Unspecified abdominal pain: Secondary | ICD-10-CM | POA: Insufficient documentation

## 2020-05-19 DIAGNOSIS — R55 Syncope and collapse: Secondary | ICD-10-CM | POA: Diagnosis not present

## 2020-05-19 DIAGNOSIS — K219 Gastro-esophageal reflux disease without esophagitis: Secondary | ICD-10-CM | POA: Diagnosis not present

## 2020-05-19 LAB — CBC WITH DIFFERENTIAL/PLATELET
Abs Immature Granulocytes: 0.06 10*3/uL (ref 0.00–0.07)
Basophils Absolute: 0 10*3/uL (ref 0.0–0.1)
Basophils Relative: 0 %
Eosinophils Absolute: 0.1 10*3/uL (ref 0.0–0.5)
Eosinophils Relative: 1 %
HCT: 49.7 % (ref 39.0–52.0)
Hemoglobin: 16.5 g/dL (ref 13.0–17.0)
Immature Granulocytes: 1 %
Lymphocytes Relative: 12 %
Lymphs Abs: 1.4 10*3/uL (ref 0.7–4.0)
MCH: 30.5 pg (ref 26.0–34.0)
MCHC: 33.2 g/dL (ref 30.0–36.0)
MCV: 91.9 fL (ref 80.0–100.0)
Monocytes Absolute: 0.6 10*3/uL (ref 0.1–1.0)
Monocytes Relative: 5 %
Neutro Abs: 10.4 10*3/uL — ABNORMAL HIGH (ref 1.7–7.7)
Neutrophils Relative %: 81 %
Platelets: 448 10*3/uL — ABNORMAL HIGH (ref 150–400)
RBC: 5.41 MIL/uL (ref 4.22–5.81)
RDW: 13.3 % (ref 11.5–15.5)
WBC: 12.5 10*3/uL — ABNORMAL HIGH (ref 4.0–10.5)
nRBC: 0 % (ref 0.0–0.2)

## 2020-05-19 LAB — COMPREHENSIVE METABOLIC PANEL
ALT: 18 U/L (ref 0–44)
AST: 20 U/L (ref 15–41)
Albumin: 4.2 g/dL (ref 3.5–5.0)
Alkaline Phosphatase: 65 U/L (ref 38–126)
Anion gap: 11 (ref 5–15)
BUN: 17 mg/dL (ref 6–20)
CO2: 26 mmol/L (ref 22–32)
Calcium: 9.5 mg/dL (ref 8.9–10.3)
Chloride: 99 mmol/L (ref 98–111)
Creatinine, Ser: 1.31 mg/dL — ABNORMAL HIGH (ref 0.61–1.24)
GFR, Estimated: >60 mL/min (ref >60)
Glucose, Bld: 169 mg/dL — ABNORMAL HIGH (ref 70–99)
Potassium: 4.5 mmol/L (ref 3.5–5.1)
Sodium: 136 mmol/L (ref 135–145)
Total Bilirubin: 0.5 mg/dL (ref 0.3–1.2)
Total Protein: 8.2 g/dL — ABNORMAL HIGH (ref 6.5–8.1)

## 2020-05-19 LAB — TROPONIN I (HIGH SENSITIVITY): Troponin I (High Sensitivity): 4 ng/L (ref <18)

## 2020-05-19 LAB — LIPASE, BLOOD: Lipase: 23 U/L (ref 11–51)

## 2020-05-19 NOTE — ED Provider Notes (Signed)
[facility] EMERGENCY DEPARTMENT Provider Note   CSN: [id number] Arrival date & time: 05/19/20  1227     History Chief Complaint  Patient presents with  . Abdominal Pain    Joseph Barry is a 40 y.o. male.  Patient had a near syncopal episode in the setting of abdominal pain.  He has had this in the past.  He had a history of diverticulitis.  He says this feels nothing like diverticulitis.  Has had no abdominal surgeries.  He is feeling fine prior to this.  He now feels fine with no symptoms.  He denies any chest pain.  No risk factors for PE to include no cancer, no surgery, no immobilization, no hormone therapy, no history of blood clot.        Past Medical History:  Diagnosis Date  . Depression   . Diabetes mellitus type II, uncontrolled (HCC)   . Eczema   . Finger laceration involving tendon 05/20/2014   right index, long, ring, small fingers - tendon/artery/nerve lac.  Marland Kitchen GERD (gastroesophageal reflux disease)   . History of cardiac murmur as a child   . History of MRSA infection ~ 2007   back  . Hypertension    states under control with med., has been on med. x 1 yr.  . Migraines   . Sleep apnea    uses CPAP nightly    Patient Active Problem List   Diagnosis Date Noted  . Obesity 10/28/2014  . Depression 09/13/2014  . Tobacco use disorder 06/13/2014  . Flexor tendon laceration, finger, open wound 05/24/2014  . Essential hypertension 01/17/2014  . Diabetes type 2, uncontrolled (HCC) 01/17/2014    Past Surgical History:  Procedure Laterality Date  . NERVE, TENDON AND ARTERY REPAIR Right 05/24/2014   Procedure: RIGHT INDEX LONG RING SMALL/REPAIR TENDON, ARTERY NERVE;  Surgeon: Betha Loa, MD;  Location: [facility];  Service: Orthopedics;  Laterality: Right;  . ORBITAL FRACTURE SURGERY Left 2003       Family History  Problem Relation Age of Onset  . Arthritis Maternal Grandmother   . Diabetes Maternal Grandmother   . Heart  disease Maternal Grandmother   . Hypertension Father   . Diabetes Father   . Colon polyps Father   . Hypertension Mother   . Heart disease Mother   . COPD Mother   . Cancer Paternal Grandmother        spine cancer  . Hypertension Sister     Social History   Tobacco Use  . Smoking status: Current Every Day Smoker    Years: 5.00    Types: Cigars  . Smokeless tobacco: Never Used  . Tobacco comment: 5 cigars/day  Substance Use Topics  . Alcohol use: Yes    Alcohol/week: 0.0 standard drinks    Comment: occasionally  . Drug use: No    Home Medications Prior to Admission medications   Medication Sig Start Date End Date Taking? Authorizing Provider  amitriptyline (ELAVIL) 25 MG tablet TAKE 1 TABLET BY MOUTH AT  BEDTIME 02/16/20   Nafziger, Kandee Keen, NP  Continuous Blood Gluc Receiver (FREESTYLE LIBRE 14 DAY READER) DEVI USE ONE DEVICE EVERY 14 DAYS TO MONITOR BLOOD GLUCOSE 03/01/20   Nafziger, Kandee Keen, NP  Continuous Blood Gluc Sensor (FREESTYLE LIBRE 2 SENSOR) MISC Use one device every 14 days to monitor blood glucose. 03/01/20   Nafziger, Kandee Keen, NP  glipiZIDE (GLUCOTROL XL) 10 MG 24 hr tablet TAKE 1 TABLET BY MOUTH  DAILY WITH BREAKFAST  09/20/19   Nafziger, Kandee Keen, NP  glucose blood (ONETOUCH VERIO) test strip USE TO TEST BLOOD GLUCOSE 3 TIMES DAILY 01/25/20   Nafziger, Kandee Keen, NP  JARDIANCE 25 MG TABS tablet TAKE 1 TABLET BY MOUTH  DAILY 10/18/19   Nafziger, Kandee Keen, NP  Lancets (ONETOUCH DELICA PLUS LANCET30G) MISC USE 3 TIMES DAILY 02/22/20   Nafziger, Kandee Keen, NP  Magnesium 250 MG TABS Take 1 tablet by mouth daily.    [provider]  metFORMIN (GLUCOPHAGE) 500 MG tablet TAKE 1 TABLET BY MOUTH  TWICE DAILY WITH MEALS 09/20/19   Nafziger, Kandee Keen, NP  metroNIDAZOLE (FLAGYL) 500 MG tablet Take 1 tablet (500 mg total) by mouth 3 (three) times daily. 02/11/20   Nafziger, Kandee Keen, NP  POTASSIUM PO Take 250 mg by mouth daily.    [provider]  Probiotic Product (PROBIOTIC PO) Take by mouth.     [provider]  simvastatin (ZOCOR) 20 MG tablet TAKE 1 TABLET BY MOUTH AT  BEDTIME 02/16/20   Shirline Frees, NP    Allergies    Shellfish allergy  Review of Systems   Review of Systems  Constitutional: Negative for chills and fever.  HENT: Negative for congestion and rhinorrhea.   Respiratory: Negative for cough and shortness of breath.   Cardiovascular: Negative for chest pain and palpitations.  Gastrointestinal: Positive for abdominal pain. Negative for diarrhea, nausea and vomiting.  Genitourinary: Negative for difficulty urinating and dysuria.  Musculoskeletal: Negative for arthralgias and back pain.  Skin: Negative for color change and rash.  Neurological: Positive for light-headedness. Negative for headaches.    Physical Exam Updated Vital Signs BP (!) 125/91 (BP Location: Right Arm)   Pulse 92   Temp 98.5 F (36.9 C) (Oral)   Resp 20   Ht 5' 9.5" (1.765 m)   Wt 115.1 kg   SpO2 100%   BMI 36.94 kg/m   Physical Exam Vitals and nursing note reviewed.  Constitutional:      General: He is not in acute distress.    Appearance: Normal appearance.  HENT:     Head: Normocephalic and atraumatic.     Nose: No rhinorrhea.  Eyes:     General:        Right eye: No discharge.        Left eye: No discharge.     Conjunctiva/sclera: Conjunctivae normal.  Cardiovascular:     Rate and Rhythm: Normal rate and regular rhythm.  Pulmonary:     Effort: Pulmonary effort is normal.     Breath sounds: No stridor.  Abdominal:     General: Abdomen is flat. There is no distension.     Palpations: Abdomen is soft. There is no pulsatile mass.     Tenderness: There is no abdominal tenderness. There is no guarding or rebound.  Musculoskeletal:        General: No deformity or signs of injury.  Skin:    General: Skin is warm and dry.     Capillary Refill: Capillary refill takes less than 2 seconds.  Neurological:     General: No focal deficit present.     Mental Status: He  is alert. Mental status is at baseline.     Motor: No weakness.  Psychiatric:        Mood and Affect: Mood normal.        Behavior: Behavior normal.        Thought Content: Thought content normal.     ED Results / Procedures / Treatments  Labs (all labs ordered are listed, but only abnormal results are displayed) Labs Reviewed  CBC WITH DIFFERENTIAL/PLATELET - Abnormal; Notable for the following components:      Result Value   WBC 12.5 (*)    Platelets 448 (*)    Neutro Abs 10.4 (*)    All other components within normal limits  COMPREHENSIVE METABOLIC PANEL - Abnormal; Notable for the following components:   Glucose, Bld 169 (*)    Creatinine, Ser 1.31 (*)    Total Protein 8.2 (*)    All other components within normal limits  LIPASE, BLOOD  TROPONIN I (HIGH SENSITIVITY)    EKG EKG Interpretation  Date/Time:  Friday May 19 2020 12:51:10 EDT Ventricular Rate:  76 PR Interval:  138 QRS Duration: 85 QT Interval:  405 QTC Calculation: 456 R Axis:   -30 Text Interpretation: Sinus rhythm Left axis deviation Borderline T abnormalities, inferior leads Confirmed by Cherlynn Perches ([id number]) on 05/19/2020 2:16:07 PM   Radiology No results found.  Procedures Ultrasound ED Abd  Date/Time: 05/19/2020 1:38 PM Performed by: Sabino Donovan, MD Authorized by: Sabino Donovan, MD   Procedure details:    Indications: abdominal pain     Assessment for:  AAA   Aorta:  Visualized       Vascular findings:    Aorta: aorta normal (< 3cm)     Aorta diameter:  2   Intra-abdominal fluid: unidentified       Medications Ordered in ED Medications - No data to display  ED Course  I have reviewed the triage vital signs and the nursing notes.  Pertinent labs & imaging results that were available during my care of the patient were reviewed by me and considered in my medical decision making (see chart for details).    MDM Rules/Calculators/A&P                           Abdominal pain and  near syncope with similar history years ago.  Now feels fine and felt fine prior.  His abdominal exam is benign.  His labs will be drawn EKG was completed and shows sinus rhythm with no acute ischemic change interval abnormality or arrhythmia.  He will get a troponin.  This happened several hours ago and he has not had symptoms since I think a one-time troponin is fine.  He is PERC negative for PE.  He is overall doing well.  Low suspicion of finding any acute pathology likely vasovagal in the setting of abdominal pain that is passed.  Likely vasovagal syncope in the setting of abdominal pain.  No focal source of abdominal pain.  Labs after my review are unremarkable.  Ultrasound of the abdominal aorta is unremarkable she is consistent calorie previous imaging that I reviewed personally.  EKG is unremarkable for acute ischemic change interval abnormality arrhythmia.  Troponin is negative.  Patient is feeling at baseline.  He has had this happen several times in the past.  No focal cause for this today.  Unlikely to be any other emergent pathology.  He is asked to return with symptoms worsening or other concerns.  He understands and feels comfortable discharge   Final Clinical Impression(s) / ED Diagnoses Final diagnoses:  Near syncope  Undifferentiated abdominal pain    Rx / DC Orders ED Discharge Orders    None       Sabino Donovan, MD 05/19/20 1424

## 2020-05-19 NOTE — ED Triage Notes (Addendum)
Abdominal pain this am. He almost passed out while he was having the pain. His glucose was 146 afterward.

## 2020-05-23 ENCOUNTER — Other Ambulatory Visit: Payer: Self-pay

## 2020-05-24 ENCOUNTER — Ambulatory Visit: Payer: 59 | Admitting: Adult Health

## 2020-05-31 ENCOUNTER — Other Ambulatory Visit: Payer: Self-pay | Admitting: Adult Health

## 2020-05-31 DIAGNOSIS — E1165 Type 2 diabetes mellitus with hyperglycemia: Secondary | ICD-10-CM

## 2020-06-01 ENCOUNTER — Other Ambulatory Visit: Payer: Self-pay

## 2020-06-01 ENCOUNTER — Ambulatory Visit (INDEPENDENT_AMBULATORY_CARE_PROVIDER_SITE_OTHER): Payer: 59 | Admitting: Adult Health

## 2020-06-01 ENCOUNTER — Encounter: Payer: Self-pay | Admitting: Adult Health

## 2020-06-01 VITALS — BP 122/72 | HR 81 | Temp 98.7°F | Ht 69.5 in | Wt 259.0 lb

## 2020-06-01 DIAGNOSIS — E118 Type 2 diabetes mellitus with unspecified complications: Secondary | ICD-10-CM | POA: Diagnosis not present

## 2020-06-01 DIAGNOSIS — E1165 Type 2 diabetes mellitus with hyperglycemia: Secondary | ICD-10-CM

## 2020-06-01 DIAGNOSIS — R109 Unspecified abdominal pain: Secondary | ICD-10-CM

## 2020-06-01 DIAGNOSIS — D72829 Elevated white blood cell count, unspecified: Secondary | ICD-10-CM | POA: Diagnosis not present

## 2020-06-01 DIAGNOSIS — R55 Syncope and collapse: Secondary | ICD-10-CM | POA: Diagnosis not present

## 2020-06-01 LAB — CBC WITH DIFFERENTIAL/PLATELET
Basophils Absolute: 0 10*3/uL (ref 0.0–0.1)
Basophils Relative: 0.5 % (ref 0.0–3.0)
Eosinophils Absolute: 0.1 10*3/uL (ref 0.0–0.7)
Eosinophils Relative: 2.9 % (ref 0.0–5.0)
HCT: 46.4 % (ref 39.0–52.0)
Hemoglobin: 15.7 g/dL (ref 13.0–17.0)
Lymphocytes Relative: 31.9 % (ref 12.0–46.0)
Lymphs Abs: 1.6 10*3/uL (ref 0.7–4.0)
MCHC: 33.8 g/dL (ref 30.0–36.0)
MCV: 90.6 fl (ref 78.0–100.0)
Monocytes Absolute: 0.4 10*3/uL (ref 0.1–1.0)
Monocytes Relative: 6.8 % (ref 3.0–12.0)
Neutro Abs: 3 10*3/uL (ref 1.4–7.7)
Neutrophils Relative %: 57.9 % (ref 43.0–77.0)
Platelets: 424 10*3/uL — ABNORMAL HIGH (ref 150.0–400.0)
RBC: 5.12 Mil/uL (ref 4.22–5.81)
RDW: 13.9 % (ref 11.5–15.5)
WBC: 5.2 10*3/uL (ref 4.0–10.5)

## 2020-06-01 MED ORDER — FREESTYLE LIBRE 2 SENSOR MISC: 3 refills | Status: DC

## 2020-06-01 MED ORDER — TRIAMCINOLONE ACETONIDE 0.1 % EX CREA: 1.0000 "application " | TOPICAL_CREAM | Freq: Two times a day (BID) | CUTANEOUS | 2 refills | Status: DC

## 2020-06-01 NOTE — Progress Notes (Signed)
Subjective:    Patient ID: Joseph Barry, male    DOB: Feb 29, 1980, 40 y.o.   MRN: [id number]  HPI .39 year old male who  has a past medical history of Depression, Diabetes mellitus type II, uncontrolled (HCC), Eczema, Finger laceration involving tendon (05/20/2014), GERD (gastroesophageal reflux disease), History of cardiac murmur as a child, History of MRSA infection (~ 2007), Hypertension, Migraines, and Sleep apnea.  He presents to the office today for follow-up after being seen in the emergency room on 05/19/2020 with a near syncopal episode in the setting of abdominal pain.  In the ER he reported that he has had this issue in the past.  Does have a history of diverticulitis but this did not feel like typical diverticulitis pain.  His work-up in the emergency room including labs, ultrasound of the abdominal aorta, EKG was unremarkable ( except for elevation in WBC) and it was thought that he likely had a vasovagal episode in response to abdominal pain.  Today he reports that during this episode in question he up moving around and when he started to feel bad he went into his cubical and did up having a syncopal episode in his chair, his coworker found him slumped over. Does not report any eye twitching, incontinence, or gazing. He is unsure of how long he was out.    He has been experiencing some low blood sugars into the 50-60's as well. His last a1c earlier this month was 6.2 and this was a drop from 7.0   Today he reports that since being seen in the ER he feels " very tired, like I could go back to sleep when I get out of bed in the morning." Still feels a little lightheaded as well. Just does not feel like himself.    Review of Systems See HPI   Past Medical History:  Diagnosis Date  . Depression   . Diabetes mellitus type II, uncontrolled (HCC)   . Eczema   . Finger laceration involving tendon 05/20/2014   right index, long, ring, small fingers - tendon/artery/nerve lac.  Marland Kitchen GERD  (gastroesophageal reflux disease)   . History of cardiac murmur as a child   . History of MRSA infection ~ 2007   back  . Hypertension    states under control with med., has been on med. x 1 yr.  . Migraines   . Sleep apnea    uses CPAP nightly    Social History   Socioeconomic History  . Marital status: Married    Spouse name: Not on file  . Number of children: Not on file  . Years of education: Not on file  . Highest education level: Not on file  Occupational History  . Not on file  Tobacco Use  . Smoking status: Current Every Day Smoker    Years: 5.00    Types: Cigars  . Smokeless tobacco: Never Used  . Tobacco comment: 5 cigars/day  Substance and Sexual Activity  . Alcohol use: Yes    Alcohol/week: 0.0 standard drinks    Comment: occasionally  . Drug use: No  . Sexual activity: Not on file  Other Topics Concern  . 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    Married for five years   Has step daughter.    Has rabbit and dog.    Social Determinants of Health   Financial Resource Strain: Not on file  Food Insecurity:  Not on file  Transportation Needs: Not on file  Physical Activity: Not on file  Stress: Not on file  Social Connections: Not on file  Intimate Partner Violence: Not on file    Past Surgical History:  Procedure Laterality Date  . NERVE, TENDON AND ARTERY REPAIR Right 05/24/2014   Procedure: RIGHT INDEX LONG RING SMALL/REPAIR TENDON, ARTERY NERVE;  Surgeon: Betha Loa, MD;  Location: [facility];  Service: Orthopedics;  Laterality: Right;  . ORBITAL FRACTURE SURGERY Left 2003    Family History  Problem Relation Age of Onset  . Arthritis Maternal Grandmother   . Diabetes Maternal Grandmother   . Heart disease Maternal Grandmother   . Hypertension Father   . Diabetes Father   . Colon polyps Father   . Hypertension Mother   . Heart disease Mother   . COPD Mother   .  Cancer Paternal Grandmother        spine cancer  . Hypertension Sister     Allergies  Allergen Reactions  . Shellfish Allergy Shortness Of Breath and Swelling  . Tape     rash    Current Outpatient Medications on File Prior to Visit  Medication Sig Dispense Refill  . amitriptyline (ELAVIL) 25 MG tablet TAKE 1 TABLET BY MOUTH AT  BEDTIME 90 tablet 3  . Continuous Blood Gluc Receiver (FREESTYLE LIBRE 14 DAY READER) DEVI USE ONE DEVICE EVERY 14 DAYS TO MONITOR BLOOD GLUCOSE 6 each 3  . Continuous Blood Gluc Sensor (FREESTYLE LIBRE 2 SENSOR) MISC Use one device every 14 days to monitor blood glucose. 6 each 3  . gabapentin (NEURONTIN) 300 MG capsule 1 capsule    . glipiZIDE (GLUCOTROL XL) 10 MG 24 hr tablet TAKE 1 TABLET BY MOUTH  DAILY WITH BREAKFAST 90 tablet 3  . glucose blood (ONETOUCH VERIO) test strip USE TO TEST BLOOD GLUCOSE 3 TIMES DAILY 300 strip 3  . JARDIANCE 25 MG TABS tablet TAKE 1 TABLET BY MOUTH  DAILY 90 tablet 3  . Lancets (ONETOUCH DELICA PLUS LANCET30G) MISC USE 3 TIMES DAILY 300 each 3  . metFORMIN (GLUCOPHAGE) 500 MG tablet TAKE 1 TABLET BY MOUTH  TWICE DAILY WITH MEALS 180 tablet 3  . simvastatin (ZOCOR) 20 MG tablet TAKE 1 TABLET BY MOUTH AT  BEDTIME 90 tablet 3  . meloxicam (MOBIC) 15 MG tablet Take 1 tablet by mouth daily.     No current facility-administered medications on file prior to visit.    BP 122/72   Pulse 81   Temp 98.7 F (37.1 C) (Oral)   Ht 5' 9.5" (1.765 m)   Wt 259 lb (117.5 kg)   SpO2 97%   BMI 37.70 kg/m       Objective:   Physical Exam Vitals and nursing note reviewed.  Constitutional:      General: He is not in acute distress.    Appearance: Normal appearance. He is well-developed and normal weight.  HENT:     Head: Normocephalic and atraumatic.     Right Ear: Tympanic membrane, ear canal and external ear normal. There is no impacted cerumen.     Left Ear: Tympanic membrane, ear canal and external ear normal. There is no  impacted cerumen.     Nose: Nose normal. No congestion or rhinorrhea.     Mouth/Throat:     Mouth: Mucous membranes are moist.     Pharynx: Oropharynx is clear. No oropharyngeal exudate or posterior oropharyngeal erythema.  Eyes:  General:        Right eye: No discharge.        Left eye: No discharge.     Extraocular Movements: Extraocular movements intact.     Conjunctiva/sclera: Conjunctivae normal.     Pupils: Pupils are equal, round, and reactive to light.  Neck:     Vascular: No carotid bruit.     Trachea: No tracheal deviation.  Cardiovascular:     Rate and Rhythm: Normal rate and regular rhythm.     Pulses: Normal pulses.     Heart sounds: Normal heart sounds. No murmur heard. No friction rub. No gallop.   Pulmonary:     Effort: Pulmonary effort is normal. No respiratory distress.     Breath sounds: Normal breath sounds. No stridor. No wheezing, rhonchi or rales.  Chest:     Chest wall: No tenderness.  Abdominal:     General: Bowel sounds are normal. There is no distension.     Palpations: Abdomen is soft. There is no mass.     Tenderness: There is no abdominal tenderness. There is no right CVA tenderness, left CVA tenderness, guarding or rebound.     Hernia: No hernia is present.  Musculoskeletal:        General: No swelling, tenderness, deformity or signs of injury. Normal range of motion.     Right lower leg: No edema.     Left lower leg: No edema.  Lymphadenopathy:     Cervical: No cervical adenopathy.  Skin:    General: Skin is warm and dry.     Capillary Refill: Capillary refill takes less than 2 seconds.     Coloration: Skin is not jaundiced or pale.     Findings: No bruising, erythema, lesion or rash.  Neurological:     General: No focal deficit present.     Mental Status: He is alert and oriented to person, place, and time.     Cranial Nerves: No cranial nerve deficit.     Sensory: No sensory deficit.     Motor: No weakness.     Coordination:  Coordination normal.     Gait: Gait normal.     Deep Tendon Reflexes: Reflexes normal.  Psychiatric:        Mood and Affect: Mood normal.        Behavior: Behavior normal.        Thought Content: Thought content normal.        Judgment: Judgment normal.       Assessment & Plan:  1. Syncope, unspecified syncope type - Likely vasovagal but there is some concern for possible seizure like activity and concern due to recurrence of symptoms.  - Ambulatory referral to Neurology  2. Abdominal pain, unspecified abdominal location - Resolved   3. Leukocytosis, unspecified type  - CBC with Differential/Platelet; Future  4. Controlled type 2 diabetes mellitus with complication, without long-term current use of insulin (HCC) - Going to have him stop glipizide for a week and follow up with me via mychart d/t hypoglycemia.   Shirline Frees, NP

## 2020-06-03 ENCOUNTER — Other Ambulatory Visit: Payer: Self-pay | Admitting: Adult Health

## 2020-06-03 DIAGNOSIS — E1165 Type 2 diabetes mellitus with hyperglycemia: Secondary | ICD-10-CM

## 2020-06-06 ENCOUNTER — Other Ambulatory Visit: Payer: Self-pay | Admitting: Adult Health

## 2020-06-06 DIAGNOSIS — E1165 Type 2 diabetes mellitus with hyperglycemia: Secondary | ICD-10-CM

## 2020-06-08 ENCOUNTER — Other Ambulatory Visit: Payer: Self-pay | Admitting: Adult Health

## 2020-06-08 DIAGNOSIS — E1165 Type 2 diabetes mellitus with hyperglycemia: Secondary | ICD-10-CM

## 2020-06-08 MED ORDER — FREESTYLE LIBRE 2 SENSOR MISC: 3 refills | Status: DC

## 2020-06-08 NOTE — Addendum Note (Signed)
Addended by: Solon Augusta on: 06/08/2020 09:35 AM   Modules accepted: Orders

## 2020-06-14 ENCOUNTER — Telehealth: Payer: Self-pay

## 2020-06-14 NOTE — Telephone Encounter (Signed)
Joseph Barry Joseph Barry - PA Case ID: [id number] Need help? Call us at [phone] Outcome Approvedtoday Request Reference Number: [id number]. [place] KIT 2 SENSOR is approved through 06/14/2021. Your patient may now fill this prescription and it will be covered. Drug FreeStyle Libre 2 Sensor Form [facility] Electronic Prior Authorization Form [phone] NCPDP)

## 2020-06-14 NOTE — Telephone Encounter (Signed)
Ruffin Muston KeyHenderson [place] - PA Case ID: [id number] Need help? Call us at [phone] Status Sent to [employer] Drug FreeStyle Libre 2 Sensor Form [facility] Electronic Prior Authorization Form [id number] NCPDP)

## 2020-08-16 ENCOUNTER — Other Ambulatory Visit: Payer: Self-pay | Admitting: Adult Health

## 2020-08-16 DIAGNOSIS — E1165 Type 2 diabetes mellitus with hyperglycemia: Secondary | ICD-10-CM

## 2020-08-29 ENCOUNTER — Encounter: Payer: Self-pay | Admitting: Neurology

## 2020-08-29 ENCOUNTER — Ambulatory Visit (INDEPENDENT_AMBULATORY_CARE_PROVIDER_SITE_OTHER): Payer: 59

## 2020-08-29 ENCOUNTER — Ambulatory Visit (INDEPENDENT_AMBULATORY_CARE_PROVIDER_SITE_OTHER): Payer: 59 | Admitting: Neurology

## 2020-08-29 ENCOUNTER — Other Ambulatory Visit: Payer: Self-pay | Admitting: Neurology

## 2020-08-29 VITALS — BP 133/94 | HR 74 | Ht 69.0 in | Wt 256.0 lb

## 2020-08-29 DIAGNOSIS — I10 Essential (primary) hypertension: Secondary | ICD-10-CM

## 2020-08-29 DIAGNOSIS — R55 Syncope and collapse: Secondary | ICD-10-CM

## 2020-08-29 DIAGNOSIS — E1165 Type 2 diabetes mellitus with hyperglycemia: Secondary | ICD-10-CM | POA: Diagnosis not present

## 2020-08-29 NOTE — Progress Notes (Unsigned)
Patient enrolled for [employer] to mail a 3 day ZIO XT monitor to his home. 

## 2020-08-29 NOTE — Progress Notes (Signed)
[facility]  PATIENT: Joseph MontesMichael Barry DOB: 1980-04-19  REFERRING DOCTOR OR PCP: Duane Lopeorey Nafziger NP SOURCE: Patient, notes from primary care, ED notes  _________________________________   HISTORICAL  CHIEF COMPLAINT:  Chief Complaint  Patient presents with   New Patient (Initial Visit)    Rm 2, alone. Internal referral for near syncope. Seen at the ED on 5/13. Pt states his slight became blurry, and was unable to move or talk. Pt has had 4-5 episode in the last 3-4 years. Pt states his cognitive memory is not where it use to be.     HISTORY OF PRESENT ILLNESS:  I had the pleasure of [facility] your patient, Joseph Barry[facility] for neurologic consultation regarding his multiple episodes of syncope and presyncope.  He is a 40 year old man who has had multiple. episodes of presyncope and syncope  He feels lightheaded and weak.   Peripheral vision is reduced and sounds are muted.    He then has loss of consciousness.   This has happened about 8-10 times in the last few years.   He then comes to after a few minutes.    He feels he was just out for a second but others note he is unresponsive x 5-7 minutes.   He recalls when he cones to being groggy for a minute or two.  After a few minutes he is back to baseline.    Last time he had this happened 05/19/2020 he went to the ED.  EKG showed NSR and normal intervals.  No ischemic changes.   He does not think anything was unusual about the days before the event.    He is usually standing when one occurs though one occurred while sitting in a meeting.   Some occurred while doing physical activity - changing th tires, fixing the toilet.   One spell occurred while urinating in the middle of the night.  He has never had tonic clonic activity.   He sweats a lot but has never had incontinence with the episodes.    He has had a few episodes where he feels lightheaded and weak but not the other symptoms.      He has NIDDM, elevated cholesterol and is otherwise healthy.   REVIEW OF SYSTEMS: Constitutional: No fevers, chills, sweats, or change in appetite Eyes: No visual changes, double vision, eye pain Ear, nose and throat: No hearing loss, ear pain, nasal congestion, sore throat Cardiovascular: No chest pain, palpitations Respiratory:  No shortness of breath at rest or with exertion.   No wheezes GastrointestinaI: No nausea, vomiting, diarrhea, abdominal pain, fecal incontinence Genitourinary:  No dysuria, urinary retention or frequency.  No nocturia. Musculoskeletal:  No neck pain, back pain Integumentary: No rash, pruritus, skin lesions Neurological: as above Psychiatric: No depression at this time.  No anxiety Endocrine: No palpitations, diaphoresis, change in appetite, change in weigh or increased thirst Hematologic/Lymphatic:  No anemia, purpura, petechiae. Allergic/Immunologic: No itchy/runny eyes, nasal congestion, recent allergic reactions, rashes  ALLERGIES: Allergies  Allergen Reactions   Shellfish Allergy Shortness Of Breath and Swelling   Tape     rash    HOME MEDICATIONS:  Current Outpatient Medications:    amitriptyline (ELAVIL) 25 MG tablet, TAKE 1 TABLET BY MOUTH AT  BEDTIME, Disp: 90 tablet, Rfl: 3   Continuous Blood Gluc Receiver (FREESTYLE LIBRE 14 DAY READER) DEVI, USE ONE DEVICE EVERY 14 DAYS TO MONITOR BLOOD GLUCOSE, Disp: 6 each, Rfl: 3   Continuous Blood  Gluc Sensor (FREESTYLE LIBRE 2 SENSOR) MISC, Use one device every 14 days to monitor blood glucose., Disp: 6 each, Rfl: 3   glipiZIDE (GLUCOTROL XL) 10 MG 24 hr tablet, TAKE 1 TABLET BY MOUTH  DAILY WITH BREAKFAST, Disp: 90 tablet, Rfl: 3   glucose blood (ONETOUCH VERIO) test strip, USE TO TEST BLOOD GLUCOSE 3 TIMES DAILY, Disp: 300 strip, Rfl: 3   JARDIANCE 25 MG TABS tablet, TAKE 1 TABLET BY MOUTH  DAILY, Disp: 90 tablet, Rfl: 3   Lancets (ONETOUCH DELICA PLUS LANCET30G) MISC, USE 3 TIMES DAILY, Disp: 300  each, Rfl: 3   metFORMIN (GLUCOPHAGE) 500 MG tablet, TAKE 1 TABLET BY MOUTH  TWICE DAILY WITH MEALS, Disp: 180 tablet, Rfl: 3   simvastatin (ZOCOR) 20 MG tablet, TAKE 1 TABLET BY MOUTH AT  BEDTIME, Disp: 90 tablet, Rfl: 3   triamcinolone cream (KENALOG) 0.1 %, Apply 1 application topically 2 (two) times daily., Disp: 45 g, Rfl: 2  PAST MEDICAL HISTORY: Past Medical History:  Diagnosis Date   Depression    Diabetes mellitus type II, uncontrolled (HCC)    Eczema    Finger laceration involving tendon 05/20/2014   right index, long, ring, small fingers - tendon/artery/nerve lac.   GERD (gastroesophageal reflux disease)    History of cardiac murmur as a child    History of MRSA infection ~ 2007   back   Hypertension    states under control with med., has been on med. x 1 yr.   Migraines    Sleep apnea    uses CPAP nightly    PAST SURGICAL HISTORY: Past Surgical History:  Procedure Laterality Date   NERVE, TENDON AND ARTERY REPAIR Right 05/24/2014   Procedure: RIGHT INDEX LONG RING SMALL/REPAIR TENDON, ARTERY NERVE;  Surgeon: Betha Loa, MD;  Location: [facility];  Service: Orthopedics;  Laterality: Right;   ORBITAL FRACTURE SURGERY Left 2003    FAMILY HISTORY: Family History  Problem Relation Age of Onset   Arthritis Maternal Grandmother    Diabetes Maternal Grandmother    Heart disease Maternal Grandmother    Hypertension Father    Diabetes Father    Colon polyps Father    Hypertension Mother    Heart disease Mother    COPD Mother    Cancer Paternal Grandmother        spine cancer   Hypertension Sister     SOCIAL HISTORY:  Social History   Socioeconomic History   Marital status: Married    Spouse name: vanessa   Number of children: Not on file   Years of education: Not on file   Highest education level: Associate degree: occupational, Scientist, product/process development, or vocational program  Occupational History   Not on file  Tobacco Use   Smoking status: Every Day     Types: Cigars   Smokeless tobacco: Never   Tobacco comments:    5 cigars/day  Substance and Sexual Activity   Alcohol use: Yes    Alcohol/week: 0.0 standard drinks    Comment: occasionally   Drug use: No   Sexual activity: Not on file  Other Topics Concern   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    Married for five years   Has step daughter.    Has rabbit and dog.       Lives alone   Right handed   Caffeine: 1 cup of coffee every month   Social  Determinants of Health   Financial Resource Strain: Not on file  Food Insecurity: Not on file  Transportation Needs: Not on file  Physical Activity: Not on file  Stress: Not on file  Social Connections: Not on file  Intimate Partner Violence: Not on file     PHYSICAL EXAM  Vitals:   08/29/20 1409  BP: (!) 133/94  Pulse: 74  Weight: 256 lb (116.1 kg)  Height: 5\' 9"  (1.753 m)    Body mass index is 37.8 kg/m.  Orthostatic Vitls Supine  122/90   74 Sitting  134/101 78 Standin 129/97  83  General: The patient is well-developed and well-nourished and in no acute distress  HEENT:  Head is Vera/AT.  Sclera are anicteric.  Funduscopic exam shows normal optic discs and retinal vessels.  Neck: No carotid bruits are noted.  The neck is nontender.  Cardiovascular: The heart has a regular rate and rhythm with a normal S1 and S2. There were no murmurs, gallops or rubs.    Skin: Extremities are without rash or  edema.  Musculoskeletal:  Back is nontender  Neurologic Exam  Mental status: The patient is alert and oriented x 3 at the time of the examination. The patient has apparent normal recent and remote memory, with an apparently normal attention span and concentration ability.   Speech is normal.  Cranial nerves: Extraocular movements are full. Pupils are equal, round, and reactive to light and accomodation.  Visual fields are full.  Facial symmetry is present.  There is good facial sensation to soft touch bilaterally.Facial strength is normal.  Trapezius and sternocleidomastoid strength is normal. No dysarthria is noted.  The tongue is midline, and the patient has symmetric elevation of the soft palate. No obvious hearing deficits are noted.  Motor:  Muscle bulk is normal.   Tone is normal. Strength is  5 / 5 in all 4 extremities.   Sensory: Sensory testing is intact to pinprick, soft touch and vibration sensation in all 4 extremities.  Coordination: Cerebellar testing reveals good finger-nose-finger and heel-to-shin bilaterally.  Gait and station: Station is normal.   Gait is normal. Tandem gait is normal. Romberg is negative.   Reflexes: Deep tendon reflexes are symmetric and normal bilaterally.   Plantar responses are flexor.    DIAGNOSTIC DATA (LABS, IMAGING, TESTING) - I reviewed patient records, labs, notes, testing and imaging myself where available.  Lab Results  Component Value Date   WBC 5.2 06/01/2020   HGB 15.7 06/01/2020   HCT 46.4 06/01/2020   MCV 90.6 06/01/2020   PLT 424.0 (H) 06/01/2020      Component Value Date/Time   NA 136 05/19/2020 1312   K 4.5 05/19/2020 1312   CL 99 05/19/2020 1312   CO2 26 05/19/2020 1312   GLUCOSE 169 (H) 05/19/2020 1312   BUN 17 05/19/2020 1312   CREATININE 1.31 (H) 05/19/2020 1312   CREATININE 1.09 06/21/2015 1654   CALCIUM 9.5 05/19/2020 1312   PROT 8.2 (H) 05/19/2020 1312   ALBUMIN 4.2 05/19/2020 1312   AST 20 05/19/2020 1312   ALT 18 05/19/2020 1312   ALKPHOS 65 05/19/2020 1312   BILITOT 0.5 05/19/2020 1312   GFRNONAA >60 05/19/2020 1312   GFRAA >60 04/23/2017 2004   Lab Results  Component Value Date   CHOL 189 02/11/2020   HDL 37.30 (L) 02/11/2020   LDLCALC 134 (H) 02/11/2020   TRIG 88.0 02/11/2020   CHOLHDL 5 02/11/2020   Lab Results  Component Value Date  HGBA1C 6.2 05/17/2020   No results found for: VITAMINB12 Lab Results  Component Value Date   TSH 1.32  02/11/2020       ASSESSMENT AND PLAN  Syncope, unspecified syncope type - Plan: HOLTER MONITOR - 48 HOUR, EEG adult  Essential hypertension  Uncontrolled type 2 diabetes mellitus with hyperglycemia (HCC)   In summary, Mr. Skoczylas is a 40 year old man who has had multiple episodes of syncope and presyncope.  He did not have orthostatic hypotension today in the office.  From his description of the episodes, syncope, probably orthostatic in nature, is a most likely explanation.  However, it is unusual to have unresponsiveness for several minutes.  Once he returned to consciousness, he rapidly returned to baseline and he has never had witnessed generalized tonic-clonic seizure activity, thus syncope is still more likely than seizure.  We will need to check an EEG and also I will check a 48-hour Holter monitor.  Because he has had multiple episodes, and both of these are normal we could consider 0.1 mg fludrocortisone as a possible treatment that would likely help if this was orthostatic hypotension.  He will return to see me as needed based on the results of the studies.  Please let me know if I can be of further assistance with him or other patients in the future.  Johnathon Mittal A. Epimenio Foot, MD, [facility] 08/29/2020, 2:40 PM Certified in Neurology, Clinical Neurophysiology, Sleep Medicine and Neuroimaging  [facility] [address], [address] [place], [place] [address] [phone]

## 2020-08-30 ENCOUNTER — Telehealth: Payer: Self-pay | Admitting: Neurology

## 2020-08-30 NOTE — Telephone Encounter (Signed)
EEG order sent to [facility], they will reach out to pt to schedule. 

## 2020-09-01 DIAGNOSIS — R55 Syncope and collapse: Secondary | ICD-10-CM

## 2020-09-04 ENCOUNTER — Ambulatory Visit (INDEPENDENT_AMBULATORY_CARE_PROVIDER_SITE_OTHER): Payer: 59 | Admitting: Neurology

## 2020-09-04 ENCOUNTER — Other Ambulatory Visit: Payer: Self-pay

## 2020-09-04 DIAGNOSIS — R55 Syncope and collapse: Secondary | ICD-10-CM

## 2020-09-04 NOTE — Progress Notes (Signed)
Routine EEG completed at [facility] with photic stimulation without hyperventilation. Full report to follow by Dr. Alpine Nation, MD

## 2020-09-07 NOTE — Progress Notes (Signed)
   [facility]  EEG (ELECTROENCEPHALOGRAM) REPORT   STUDY DATE: 09/04/2020 PATIENT NAME: Joseph Barry DOB: Aug 06, 1980 MRN: [id number]  ORDERING CLINICIAN: Kashina Mecum A. Epimenio Foot, MD. PhD  TECHNOLOGIST: [facility] TECHNIQUE: Electroencephalogram was recorded utilizing standard 10-20 system of lead placement and reformatted into average and bipolar montages.  RECORDING TIME: 25 minutes  CLINICAL INFORMATION: 40 year old man with syncope  FINDINGS: A digital EEG was performed while the patient was awake and drowsy. While awake and most alert there was a 11 hz posterior dominant rhythm. Voltages and frequencies were symmetric.  There were no focal, lateralizing, epileptiform activity or seizures seen.  Photic stimulation had a normal driving response. EKG channel shows normal sinus rhythm.  The patient did not become drowsy.  No sleep was recorded.  IMPRESSION: This was a normal EEG while the patient was awake.   INTERPRETING PHYSICIAN:   Lorece Keach A. Epimenio Foot, MD, PhD, [facility] Certified in Neurology, Clinical Neurophysiology, Sleep Medicine, Pain Medicine and Neuroimaging  [facility] [address], [address] [place], [place] [address] [phone]

## 2020-11-17 ENCOUNTER — Encounter: Payer: Self-pay | Admitting: Adult Health

## 2020-11-17 ENCOUNTER — Ambulatory Visit (INDEPENDENT_AMBULATORY_CARE_PROVIDER_SITE_OTHER): Payer: 59 | Admitting: Adult Health

## 2020-11-17 VITALS — BP 120/86 | HR 80 | Temp 97.4°F | Ht 69.0 in | Wt 247.0 lb

## 2020-11-17 DIAGNOSIS — E119 Type 2 diabetes mellitus without complications: Secondary | ICD-10-CM

## 2020-11-17 LAB — POCT GLYCOSYLATED HEMOGLOBIN (HGB A1C): Hemoglobin A1C: 7 % — AB (ref 4.0–5.6)

## 2020-11-17 MED ORDER — FREESTYLE LIBRE 3 SENSOR MISC: 1.0000 [IU] | 6 refills | Status: DC

## 2020-11-17 NOTE — Progress Notes (Signed)
Subjective:    Patient ID: Joseph Barry, male    DOB: 02-Jul-1980, 39 y.o.   MRN: [id number]  HPI  40 year old male who  has a past medical history of Depression, Diabetes mellitus type II, uncontrolled, Eczema, Finger laceration involving tendon (05/20/2014), GERD (gastroesophageal reflux disease), History of cardiac murmur as a child, History of MRSA infection (~ 2007), Hypertension, Migraines, and Sleep apnea.  He presents to the office today for 30-month follow-up regarding diabetes.  He is currently prescribed metformin 500 mg  daily. , Jardiance 25 mg daily, and glipizide 10 mg extended release.  He has  not been using the [place] system to help manage his blood sugars as he has been out of sensors. During his last visit he was having hypoglycemic episodes so we decided to decrease his evening dose of metformin.  His A1c at this time was 6.2. Today he reports that he has gone back on his his normal regimen .He has not had any signs of hypoglycemia   He has been working on diet to lose weight. Has not had time to exercise much.   Wt Readings from Last 3 Encounters:  11/17/20 247 lb (112 kg)  08/29/20 256 lb (116.1 kg)  06/01/20 259 lb (117.5 kg)   Review of Systems See HPI   Past Medical History:  Diagnosis Date   Depression    Diabetes mellitus type II, uncontrolled    Eczema    Finger laceration involving tendon 05/20/2014   right index, long, ring, small fingers - tendon/artery/nerve lac.   GERD (gastroesophageal reflux disease)    History of cardiac murmur as a child    History of MRSA infection ~ 2007   back   Hypertension    states under control with med., has been on med. x 1 yr.   Migraines    Sleep apnea    uses CPAP nightly    Social History   Socioeconomic History   Marital status: Married    Spouse name: vanessa   Number of children: Not on file   Years of education: Not on file   Highest education level: Associate degree: occupational, Scientist, product/process development, or  vocational program  Occupational History   Not on file  Tobacco Use   Smoking status: Every Day    Types: Cigars   Smokeless tobacco: Never   Tobacco comments:    5 cigars/day  Substance and Sexual Activity   Alcohol use: Yes    Alcohol/week: 0.0 standard drinks    Comment: occasionally   Drug use: No   Sexual activity: Not on file  Other Topics Concern   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    Married for five years   Has step daughter.    Has rabbit and dog.       Lives alone   Right handed   Caffeine: 1 cup of coffee every month   Social Determinants of Health   Financial Resource Strain: Not on file  Food Insecurity: Not on file  Transportation Needs: Not on file  Physical Activity: Not on file  Stress: Not on file  Social Connections: Not on file  Intimate Partner Violence: Not on file    Past Surgical History:  Procedure Laterality Date   NERVE, TENDON AND ARTERY REPAIR Right 05/24/2014   Procedure: RIGHT INDEX LONG RING SMALL/REPAIR TENDON, ARTERY NERVE;  Surgeon: Betha Loa, MD;  Location: [facility];  Service: Orthopedics;  Laterality: Right;   ORBITAL FRACTURE SURGERY Left 2003    Family History  Problem Relation Age of Onset   Arthritis Maternal Grandmother    Diabetes Maternal Grandmother    Heart disease Maternal Grandmother    Hypertension Father    Diabetes Father    Colon polyps Father    Hypertension Mother    Heart disease Mother    COPD Mother    Cancer Paternal Grandmother        spine cancer   Hypertension Sister     Allergies  Allergen Reactions   Shellfish Allergy Shortness Of Breath and Swelling   Tape     rash    Current Outpatient Medications on File Prior to Visit  Medication Sig Dispense Refill   amitriptyline (ELAVIL) 25 MG tablet TAKE 1 TABLET BY MOUTH AT  BEDTIME 90 tablet 3   Continuous Blood Gluc Receiver (FREESTYLE LIBRE 14 DAY  READER) DEVI USE ONE DEVICE EVERY 14 DAYS TO MONITOR BLOOD GLUCOSE 6 each 3   Continuous Blood Gluc Sensor (FREESTYLE LIBRE 2 SENSOR) MISC Use one device every 14 days to monitor blood glucose. 6 each 3   glipiZIDE (GLUCOTROL XL) 10 MG 24 hr tablet TAKE 1 TABLET BY MOUTH  DAILY WITH BREAKFAST 90 tablet 3   glucose blood (ONETOUCH VERIO) test strip USE TO TEST BLOOD GLUCOSE 3 TIMES DAILY 300 strip 3   JARDIANCE 25 MG TABS tablet TAKE 1 TABLET BY MOUTH  DAILY 90 tablet 3   Lancets (ONETOUCH DELICA PLUS LANCET30G) MISC USE 3 TIMES DAILY 300 each 3   metFORMIN (GLUCOPHAGE) 500 MG tablet TAKE 1 TABLET BY MOUTH  TWICE DAILY WITH MEALS 180 tablet 3   simvastatin (ZOCOR) 20 MG tablet TAKE 1 TABLET BY MOUTH AT  BEDTIME 90 tablet 3   triamcinolone cream (KENALOG) 0.1 % Apply 1 application topically 2 (two) times daily. 45 g 2   No current facility-administered medications on file prior to visit.    BP 120/86   Pulse 80   Temp (!) 97.4 F (36.3 C) (Oral)   Ht 5\' 9"  (1.753 m)   Wt 247 lb (112 kg)   SpO2 96%   BMI 36.48 kg/m       Objective:   Physical Exam Vitals and nursing note reviewed.  Constitutional:      Appearance: Normal appearance.  Cardiovascular:     Rate and Rhythm: Normal rate and regular rhythm.     Pulses: Normal pulses.     Heart sounds: Normal heart sounds.  Pulmonary:     Effort: Pulmonary effort is normal.     Breath sounds: Normal breath sounds.  Musculoskeletal:        General: Normal range of motion.  Skin:    General: Skin is warm and dry.  Neurological:     General: No focal deficit present.     Mental Status: He is alert and oriented to person, place, and time.  Psychiatric:        Mood and Affect: Mood normal.        Behavior: Behavior normal.      Assessment & Plan:  1. Diabetes mellitus without complication (HCC)  - POC HgB A1c- 7.0  - We talked about switching to [facility]. He would like to try this medication. Sample of 2.5 mg dose given for one  month. Will have him stop Jardiance and Glipizide for the time being. Continue with Metformin.  - Follow up  in one month or sooner if needed - Continuous Blood Gluc Sensor (FREESTYLE LIBRE 3 SENSOR) MISC; 1 Units by Does not apply route every 14 (fourteen) days. Place 1 sensor on the skin every 14 days. Use to check glucose continuously  Dispense: 2 each; Refill: 6  Shirline Frees, NP

## 2020-11-21 ENCOUNTER — Encounter: Payer: Self-pay | Admitting: Adult Health

## 2020-11-21 MED ORDER — ONDANSETRON 4 MG PO TBDP: 4.0000 mg | ORAL_TABLET | Freq: Three times a day (TID) | ORAL | 0 refills | Status: DC | PRN

## 2020-11-21 NOTE — Telephone Encounter (Signed)
Called pt and pt stated it is ok to send in Rx to [facility]. No further action needed!

## 2020-11-21 NOTE — Telephone Encounter (Signed)
Called pt to get more information. Pt stated that he started mounjuro Sat.and started having side effects. Side effects include vomiting, diarrhea and severe abd. Pain. Pt stated that the vomit was very acidic where it was causing him to spit up blood. Pt also stated that he has a loss of appetite. This has been going on for 2 days now. I advised pt that these are the common side effects to taking this medication. Pt advised that I will send to [facility] for advise. Pt verbalized understanding.

## 2020-12-04 ENCOUNTER — Other Ambulatory Visit: Payer: Self-pay | Admitting: Adult Health

## 2020-12-04 DIAGNOSIS — E1165 Type 2 diabetes mellitus with hyperglycemia: Secondary | ICD-10-CM

## 2020-12-06 ENCOUNTER — Encounter: Payer: Self-pay | Admitting: Adult Health

## 2020-12-06 ENCOUNTER — Other Ambulatory Visit: Payer: Self-pay | Admitting: Adult Health

## 2020-12-08 ENCOUNTER — Other Ambulatory Visit: Payer: Self-pay | Admitting: Adult Health

## 2020-12-08 DIAGNOSIS — E119 Type 2 diabetes mellitus without complications: Secondary | ICD-10-CM

## 2020-12-08 MED ORDER — FREESTYLE LIBRE 3 SENSOR MISC: 1.0000 [IU] | 6 refills | Status: DC

## 2020-12-08 MED ORDER — TIRZEPATIDE 5 MG/0.5ML ~~LOC~~ SOAJ: 5.0000 mg | SUBCUTANEOUS | 0 refills | Status: DC

## 2020-12-12 ENCOUNTER — Other Ambulatory Visit: Payer: Self-pay | Admitting: Adult Health

## 2020-12-12 MED ORDER — FREESTYLE LIBRE 14 DAY READER DEVI: 2 refills | Status: DC

## 2020-12-19 ENCOUNTER — Telehealth (INDEPENDENT_AMBULATORY_CARE_PROVIDER_SITE_OTHER): Payer: 59 | Admitting: Adult Health

## 2020-12-19 ENCOUNTER — Encounter: Payer: Self-pay | Admitting: Adult Health

## 2020-12-19 VITALS — Ht 69.0 in | Wt 235.0 lb

## 2020-12-19 DIAGNOSIS — E119 Type 2 diabetes mellitus without complications: Secondary | ICD-10-CM

## 2020-12-19 DIAGNOSIS — E1165 Type 2 diabetes mellitus with hyperglycemia: Secondary | ICD-10-CM

## 2020-12-19 MED ORDER — TRIAMCINOLONE ACETONIDE 0.1 % EX CREA: 1.0000 "application " | TOPICAL_CREAM | Freq: Two times a day (BID) | CUTANEOUS | 2 refills | Status: DC

## 2020-12-19 NOTE — Progress Notes (Signed)
Virtual Visit via Video Note  I connected withNAME@ on 12/19/20 at  3:30 PM EST by a video enabled telemedicine application and verified that I am speaking with the correct person using two identifiers.  Location patient: home Location provider:work or home office Persons participating in the virtual visit: patient, provider  I discussed the limitations of evaluation and management by telemedicine and the availability of in person appointments. The patient expressed understanding and agreed to proceed.   HPI:  He presents to the office today for follow up regarding DM   He is currently maintained on metformin 500 mg daily and Mounjaro 5mg .  During his last visit months ago he was started on Mounjaro 2.5 mg and Jardiance and Glipizide were discontinued. He has had some diarrhea, GERD, and abdominal pain that lasts for the first 2-3 days after the injection. He is also feeling fuller a lot sooner and is not eating as much. He would like to continue with the injections.   His weight today was 235 lbs down from 247 lbs a month ago.   He has not been able to get [place] sensors as they have been on backorder. When he was able to last check his BS while taking Mounjaro he was in goal 97% of the time - he was not taking his Metformin every day at this time    ROS: See pertinent positives and negatives per HPI.  Past Medical History:  Diagnosis Date   Depression    Diabetes mellitus type II, uncontrolled    Eczema    Finger laceration involving tendon 05/20/2014   right index, long, ring, small fingers - tendon/artery/nerve lac.   GERD (gastroesophageal reflux disease)    History of cardiac murmur as a child    History of MRSA infection ~ 2007   back   Hypertension    states under control with med., has been on med. x 1 yr.   Migraines    Sleep apnea    uses CPAP nightly    Past Surgical History:  Procedure Laterality Date   NERVE, TENDON AND ARTERY REPAIR Right 05/24/2014    Procedure: RIGHT INDEX LONG RING SMALL/REPAIR TENDON, ARTERY NERVE;  Surgeon: 05/26/2014, MD;  Location: [facility];  Service: Orthopedics;  Laterality: Right;   ORBITAL FRACTURE SURGERY Left 2003    Family History  Problem Relation Age of Onset   Arthritis Maternal Grandmother    Diabetes Maternal Grandmother    Heart disease Maternal Grandmother    Hypertension Father    Diabetes Father    Colon polyps Father    Hypertension Mother    Heart disease Mother    COPD Mother    Cancer Paternal Grandmother        spine cancer   Hypertension Sister        Current Outpatient Medications:    amitriptyline (ELAVIL) 25 MG tablet, TAKE 1 TABLET BY MOUTH AT  BEDTIME, Disp: 90 tablet, Rfl: 3   Continuous Blood Gluc Receiver (FREESTYLE LIBRE 14 DAY READER) DEVI, USE ONE DEVICE EVERY 14 DAYS TO MONITOR BLOOD GLUCOSE, Disp: 6 each, Rfl: 2   Continuous Blood Gluc Sensor (FREESTYLE LIBRE 3 SENSOR) MISC, 1 Units by Does not apply route every 14 (fourteen) days. Place 1 sensor on the skin every 14 days. Use to check glucose continuously, Disp: 2 each, Rfl: 6   Lancets (ONETOUCH DELICA PLUS LANCET30G) MISC, USE 3 TIMES DAILY, Disp: 300 each, Rfl: 3   ondansetron (ZOFRAN ODT)  4 MG disintegrating tablet, Take 1 tablet (4 mg total) by mouth every 8 (eight) hours as needed for nausea or vomiting., Disp: 20 tablet, Rfl: 0   ONETOUCH VERIO test strip, USE TO TEST BLOOD GLUCOSE 3 TIMES DAILY, Disp: 150 strip, Rfl: 6   simvastatin (ZOCOR) 20 MG tablet, TAKE 1 TABLET BY MOUTH AT  BEDTIME, Disp: 90 tablet, Rfl: 3   tirzepatide (MOUNJARO) 5 MG/0.5ML Pen, Inject 5 mg into the skin once a week., Disp: 2 mL, Rfl: 0   triamcinolone cream (KENALOG) 0.1 %, Apply 1 application topically 2 (two) times daily., Disp: 45 g, Rfl: 2  EXAM:  VITALS per patient if applicable:  GENERAL: alert, oriented, appears well and in no acute distress  HEENT: atraumatic, conjunttiva clear, no obvious abnormalities on  inspection of external nose and ears  NECK: normal movements of the head and neck  LUNGS: on inspection no signs of respiratory distress, breathing rate appears normal, no obvious gross SOB, gasping or wheezing  CV: no obvious cyanosis  MS: moves all visible extremities without noticeable abnormality  PSYCH/NEURO: pleasant and cooperative, no obvious depression or anxiety, speech and thought processing grossly intact  ASSESSMENT AND PLAN:  Discussed the following assessment and plan:  1. Diabetes mellitus without complication (HCC) - Will d/c Metformin.  - When he is ready for 7.5 mg dose, will keep him on this for three months   Shirline Frees, NP   I discussed the assessment and treatment plan with the patient. The patient was provided an opportunity to ask questions and all were answered. The patient agreed with the plan and demonstrated an understanding of the instructions.   The patient was advised to call back or seek an in-person evaluation if the symptoms worsen or if the condition fails to improve as anticipated.   Shirline Frees, NP

## 2020-12-19 NOTE — Progress Notes (Deleted)
Subjective:    Patient ID: Joseph Barry, male    DOB: 1980/11/25, 40 y.o.   MRN: [id number]  HPI 40 year old male who  has a past medical history of Depression, Diabetes mellitus type II, uncontrolled, Eczema, Finger laceration involving tendon (05/20/2014), GERD (gastroesophageal reflux disease), History of cardiac murmur as a child, History of MRSA infection (~ 2007), Hypertension, Migraines, and Sleep apnea.  He presents to the office today for follow up regarding DM   He is currently maintained on metformin 500 mg daily and Mounjaro 5mg .  During his last visit months ago he was started on Mounjaro 2.5 mg and did well with the medication with no significant side effects.   Wt Readings from Last 3 Encounters:  11/17/20 247 lb (112 kg)  08/29/20 256 lb (116.1 kg)  06/01/20 259 lb (117.5 kg)    Review of Systems See HPI   Past Medical History:  Diagnosis Date   Depression    Diabetes mellitus type II, uncontrolled    Eczema    Finger laceration involving tendon 05/20/2014   right index, long, ring, small fingers - tendon/artery/nerve lac.   GERD (gastroesophageal reflux disease)    History of cardiac murmur as a child    History of MRSA infection ~ 2007   back   Hypertension    states under control with med., has been on med. x 1 yr.   Migraines    Sleep apnea    uses CPAP nightly    Social History   Socioeconomic History   Marital status: Married    Spouse name: vanessa   Number of children: Not on file   Years of education: Not on file   Highest education level: Associate degree: occupational, 2008, or vocational program  Occupational History   Not on file  Tobacco Use   Smoking status: Every Day    Types: Cigars   Smokeless tobacco: Never   Tobacco comments:    5 cigars/day  Substance and Sexual Activity   Alcohol use: Yes    Alcohol/week: 0.0 standard drinks    Comment: occasionally   Drug use: No   Sexual activity: Not on file  Other Topics  Concern   Not on file  Social History Narrative   Going to school for Scientist, 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    Married for five years   Has step daughter.    Has rabbit and dog.       Lives alone   Right handed   Caffeine: 1 cup of coffee every month   Social Determinants of Health   Financial Resource Strain: Not on file  Food Insecurity: Not on file  Transportation Needs: Not on file  Physical Activity: Not on file  Stress: Not on file  Social Connections: Not on file  Intimate Partner Violence: Not on file    Past Surgical History:  Procedure Laterality Date   NERVE, TENDON AND ARTERY REPAIR Right 05/24/2014   Procedure: RIGHT INDEX LONG RING SMALL/REPAIR TENDON, ARTERY NERVE;  Surgeon: 05/26/2014, MD;  Location: [facility];  Service: Orthopedics;  Laterality: Right;   ORBITAL FRACTURE SURGERY Left 2003    Family History  Problem Relation Age of Onset   Arthritis Maternal Grandmother    Diabetes Maternal Grandmother    Heart disease Maternal Grandmother    Hypertension Father    Diabetes Father    Colon polyps Father    Hypertension Mother    Heart disease Mother  COPD Mother    Cancer Paternal Grandmother        spine cancer   Hypertension Sister     Allergies  Allergen Reactions   Shellfish Allergy Shortness Of Breath and Swelling   Tape     rash    Current Outpatient Medications on File Prior to Visit  Medication Sig Dispense Refill   amitriptyline (ELAVIL) 25 MG tablet TAKE 1 TABLET BY MOUTH AT  BEDTIME 90 tablet 3   Continuous Blood Gluc Receiver (FREESTYLE LIBRE 14 DAY READER) DEVI USE ONE DEVICE EVERY 14 DAYS TO MONITOR BLOOD GLUCOSE 6 each 2   Continuous Blood Gluc Sensor (FREESTYLE LIBRE 3 SENSOR) MISC 1 Units by Does not apply route every 14 (fourteen) days. Place 1 sensor on the skin every 14 days. Use to check glucose continuously 2 each 6   glipiZIDE (GLUCOTROL XL) 10 MG 24 hr tablet TAKE 1 TABLET BY MOUTH  DAILY WITH  BREAKFAST (Patient not taking: Reported on 11/17/2020) 90 tablet 3   JARDIANCE 25 MG TABS tablet TAKE 1 TABLET BY MOUTH  DAILY (Patient not taking: Reported on 11/17/2020) 90 tablet 3   Lancets (ONETOUCH DELICA PLUS LANCET30G) MISC USE 3 TIMES DAILY 300 each 3   metFORMIN (GLUCOPHAGE) 500 MG tablet TAKE 1 TABLET BY MOUTH  TWICE DAILY WITH MEALS 180 tablet 3   ondansetron (ZOFRAN ODT) 4 MG disintegrating tablet Take 1 tablet (4 mg total) by mouth every 8 (eight) hours as needed for nausea or vomiting. 20 tablet 0   ONETOUCH VERIO test strip USE TO TEST BLOOD GLUCOSE 3 TIMES DAILY 150 strip 6   simvastatin (ZOCOR) 20 MG tablet TAKE 1 TABLET BY MOUTH AT  BEDTIME 90 tablet 3   tirzepatide (MOUNJARO) 5 MG/0.5ML Pen Inject 5 mg into the skin once a week. 2 mL 0   triamcinolone cream (KENALOG) 0.1 % Apply 1 application topically 2 (two) times daily. 45 g 2   No current facility-administered medications on file prior to visit.    There were no vitals taken for this visit.      Objective:   Physical Exam Vitals and nursing note reviewed.  Constitutional:      Appearance: Normal appearance.  Cardiovascular:     Rate and Rhythm: Normal rate and regular rhythm.     Pulses: Normal pulses.     Heart sounds: Normal heart sounds.  Pulmonary:     Effort: Pulmonary effort is normal.     Breath sounds: Normal breath sounds.  Musculoskeletal:        General: Normal Barry of motion.  Skin:    General: Skin is warm and dry.  Neurological:     General: No focal deficit present.     Mental Status: He is alert and oriented to person, place, and time.  Psychiatric:        Mood and Affect: Mood normal.        Behavior: Behavior normal.        Thought Content: Thought content normal.        Judgment: Judgment normal.      Assessment & Plan:

## 2020-12-26 ENCOUNTER — Other Ambulatory Visit: Payer: Self-pay | Admitting: Adult Health

## 2020-12-26 DIAGNOSIS — E119 Type 2 diabetes mellitus without complications: Secondary | ICD-10-CM

## 2020-12-26 MED ORDER — TIRZEPATIDE 7.5 MG/0.5ML ~~LOC~~ SOAJ: 7.5000 mg | SUBCUTANEOUS | 0 refills | Status: DC

## 2020-12-26 MED ORDER — FREESTYLE LIBRE 3 SENSOR MISC: 1.0000 [IU] | 6 refills | Status: DC

## 2020-12-29 ENCOUNTER — Other Ambulatory Visit: Payer: Self-pay | Admitting: Adult Health

## 2020-12-29 DIAGNOSIS — G43011 Migraine without aura, intractable, with status migrainosus: Secondary | ICD-10-CM

## 2021-01-02 ENCOUNTER — Other Ambulatory Visit: Payer: Self-pay | Admitting: Adult Health

## 2021-01-04 ENCOUNTER — Other Ambulatory Visit: Payer: Self-pay | Admitting: Adult Health

## 2021-01-04 DIAGNOSIS — G43011 Migraine without aura, intractable, with status migrainosus: Secondary | ICD-10-CM

## 2021-01-31 ENCOUNTER — Other Ambulatory Visit: Payer: Self-pay | Admitting: *Deleted

## 2021-01-31 DIAGNOSIS — E119 Type 2 diabetes mellitus without complications: Secondary | ICD-10-CM

## 2021-01-31 MED ORDER — FREESTYLE LIBRE 3 SENSOR MISC: 1.0000 [IU] | 1 refills | Status: DC

## 2021-01-31 MED ORDER — TIRZEPATIDE 7.5 MG/0.5ML ~~LOC~~ SOAJ: 7.5000 mg | SUBCUTANEOUS | 0 refills | Status: DC

## 2021-01-31 NOTE — Telephone Encounter (Signed)
Rx done. 

## 2021-02-05 ENCOUNTER — Other Ambulatory Visit: Payer: Self-pay | Admitting: Adult Health

## 2021-02-05 DIAGNOSIS — E119 Type 2 diabetes mellitus without complications: Secondary | ICD-10-CM

## 2021-02-13 ENCOUNTER — Other Ambulatory Visit: Payer: Self-pay | Admitting: Adult Health

## 2021-02-13 DIAGNOSIS — E119 Type 2 diabetes mellitus without complications: Secondary | ICD-10-CM

## 2021-02-14 ENCOUNTER — Ambulatory Visit (INDEPENDENT_AMBULATORY_CARE_PROVIDER_SITE_OTHER): Payer: 59 | Admitting: Adult Health

## 2021-02-14 ENCOUNTER — Encounter: Payer: Self-pay | Admitting: Adult Health

## 2021-02-14 VITALS — BP 110/82 | HR 81 | Temp 98.6°F | Ht 69.0 in | Wt 217.0 lb

## 2021-02-14 DIAGNOSIS — E119 Type 2 diabetes mellitus without complications: Secondary | ICD-10-CM | POA: Diagnosis not present

## 2021-02-14 LAB — POCT GLYCOSYLATED HEMOGLOBIN (HGB A1C): Hemoglobin A1C: 6 % — AB (ref 4.0–5.6)

## 2021-02-14 MED ORDER — TRIAMCINOLONE ACETONIDE 0.5 % EX OINT: 1.0000 "application " | TOPICAL_OINTMENT | Freq: Two times a day (BID) | CUTANEOUS | 0 refills | Status: DC

## 2021-02-14 NOTE — Progress Notes (Signed)
Subjective:    Patient ID: Joseph Barry, male    DOB: Nov 21, 1980, 41 y.o.   MRN: [id number]  HPI 41 year old male who  has a past medical history of Depression, Diabetes mellitus type II, uncontrolled, Eczema, Finger laceration involving tendon (05/20/2014), GERD (gastroesophageal reflux disease), History of cardiac murmur as a child, History of MRSA infection (~ 2007), Hypertension, Migraines, and Sleep apnea.  He presents to the office today for 92-month follow-up regarding diabetes mellitus.  He is currently maintained on metformin 500 mg daily and Mounjaro 7.5 mg weekly.  For the most part he does tolerate his medications well but can have some mild diarrhea/GERD/abdominal pain that last for the first 2 to 3 days after injection.  He does monitor his blood sugars with the libre system, has been having periodic lows into the 50s and 60s.  Will often forget to eat throughout the day due to not feeling hungry and when he does eat in the evening does not eat much.  Per his log book his average blood sugar over the last 90 days is 111.  Lab Results  Component Value Date   HGBA1C 7.0 (A) 11/17/2020   Wt Readings from Last 3 Encounters:  02/14/21 217 lb (98.4 kg)  12/19/20 235 lb (106.6 kg)  11/17/20 247 lb (112 kg)   Review of Systems See HPI   Past Medical History:  Diagnosis Date   Depression    Diabetes mellitus type II, uncontrolled    Eczema    Finger laceration involving tendon 05/20/2014   right index, long, ring, small fingers - tendon/artery/nerve lac.   GERD (gastroesophageal reflux disease)    History of cardiac murmur as a child    History of MRSA infection ~ 2007   back   Hypertension    states under control with med., has been on med. x 1 yr.   Migraines    Sleep apnea    uses CPAP nightly    Social History   Socioeconomic History   Marital status: Married    Spouse name: vanessa   Number of children: Not on file   Years of education: Not on file    Highest education level: Associate degree: occupational, Scientist, product/process development, or vocational program  Occupational History   Not on file  Tobacco Use   Smoking status: Every Day    Types: Cigars   Smokeless tobacco: Never   Tobacco comments:    5 cigars/day  Substance and Sexual Activity   Alcohol use: Yes    Alcohol/week: 0.0 standard drinks    Comment: occasionally   Drug use: No   Sexual activity: Not on file  Other Topics Concern   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    Married for five years   Has step daughter.    Has rabbit and dog.       Lives alone   Right handed   Caffeine: 1 cup of coffee every month   Social Determinants of Health   Financial Resource Strain: Not on file  Food Insecurity: Not on file  Transportation Needs: Not on file  Physical Activity: Not on file  Stress: Not on file  Social Connections: Not on file  Intimate Partner Violence: Not on file    Past Surgical History:  Procedure Laterality Date   NERVE, TENDON AND ARTERY REPAIR Right 05/24/2014   Procedure: RIGHT INDEX LONG RING SMALL/REPAIR TENDON, ARTERY NERVE;  Surgeon: Betha Loa, MD;  Location: [facility];  Service: Orthopedics;  Laterality: Right;   ORBITAL FRACTURE SURGERY Left 2003    Family History  Problem Relation Age of Onset   Arthritis Maternal Grandmother    Diabetes Maternal Grandmother    Heart disease Maternal Grandmother    Hypertension Father    Diabetes Father    Colon polyps Father    Hypertension Mother    Heart disease Mother    COPD Mother    Cancer Paternal Grandmother        spine cancer   Hypertension Sister     Allergies  Allergen Reactions   Shellfish Allergy Shortness Of Breath and Swelling   Tape     rash    Current Outpatient Medications on File Prior to Visit  Medication Sig Dispense Refill   amitriptyline (ELAVIL) 25 MG tablet TAKE 1 TABLET BY MOUTH AT  BEDTIME 90  tablet 0   Continuous Blood Gluc Receiver (FREESTYLE LIBRE 14 DAY READER) DEVI USE ONE DEVICE EVERY 14 DAYS TO MONITOR BLOOD GLUCOSE 6 each 2   Continuous Blood Gluc Sensor (FREESTYLE LIBRE 3 SENSOR) MISC PLACE 1 SENSOR ON THE SKIN AND  CHANGE EVERY 14 DAYS . USE TO  CHECK BLOOD GLUCOSE CONTINUOUSLY 7 each 3   Lancets (ONETOUCH DELICA PLUS LANCET30G) MISC USE 3 TIMES DAILY 300 each 3   MOUNJARO 7.5 MG/0.5ML Pen INJECT THE CONTENTS OF ONE PEN  SUBCUTANEOUSLY WEEKLY AS  DIRECTED 6 mL 3   ondansetron (ZOFRAN ODT) 4 MG disintegrating tablet Take 1 tablet (4 mg total) by mouth every 8 (eight) hours as needed for nausea or vomiting. 20 tablet 0   ONETOUCH VERIO test strip USE TO TEST BLOOD GLUCOSE 3 TIMES DAILY 150 strip 6   simvastatin (ZOCOR) 20 MG tablet TAKE 1 TABLET BY MOUTH AT  BEDTIME 90 tablet 3   triamcinolone cream (KENALOG) 0.1 % Apply 1 application topically 2 (two) times daily. 45 g 2   No current facility-administered medications on file prior to visit.    BP 110/82    Pulse 81    Temp 98.6 F (37 C) (Oral)    Ht 5\' 9"  (1.753 m)    Wt 217 lb (98.4 kg)    SpO2 100%    BMI 32.05 kg/m       Objective:   Physical Exam Vitals and nursing note reviewed.  Constitutional:      Appearance: Normal appearance.  Cardiovascular:     Rate and Rhythm: Normal rate and regular rhythm.     Pulses: Normal pulses.     Heart sounds: Normal heart sounds.  Pulmonary:     Effort: Pulmonary effort is normal.     Breath sounds: Normal breath sounds.  Musculoskeletal:        General: Normal range of motion.  Skin:    General: Skin is warm and dry.  Neurological:     General: No focal deficit present.     Mental Status: He is alert and oriented to person, place, and time.  Psychiatric:        Mood and Affect: Mood normal.        Behavior: Behavior normal.        Thought Content: Thought content normal.        Judgment: Judgment normal.       Assessment & Plan:  1. Diabetes mellitus without  complication (HCC)  - POC HgB A1c- 6.0  -His weight is down.  We discussed decreasing his dose to 5 mg, he would like to continue with the 7.5 mg since he just received the shipment.  Was encouraged to eat multiple small meals throughout the afternoon to help prevent low blood sugars.  Shirline Frees, NP  Time spent on chart review, time with patient; discussion of DM, home monitoring  treatment, follow up plan, and documentation 30 minutes

## 2021-02-20 ENCOUNTER — Other Ambulatory Visit: Payer: Self-pay

## 2021-02-20 MED ORDER — TRIAMCINOLONE ACETONIDE 0.1 % EX CREA: 1.0000 "application " | TOPICAL_CREAM | Freq: Two times a day (BID) | CUTANEOUS | 2 refills | Status: DC

## 2021-03-07 ENCOUNTER — Encounter: Payer: Self-pay | Admitting: Neurology

## 2021-03-07 ENCOUNTER — Ambulatory Visit (INDEPENDENT_AMBULATORY_CARE_PROVIDER_SITE_OTHER): Payer: 59 | Admitting: Neurology

## 2021-03-07 ENCOUNTER — Ambulatory Visit: Payer: 59 | Admitting: Neurology

## 2021-03-07 VITALS — BP 113/70 | HR 74 | Ht 69.0 in | Wt 209.5 lb

## 2021-03-07 DIAGNOSIS — R55 Syncope and collapse: Secondary | ICD-10-CM

## 2021-03-07 DIAGNOSIS — R42 Dizziness and giddiness: Secondary | ICD-10-CM | POA: Diagnosis not present

## 2021-03-07 NOTE — Progress Notes (Signed)
[facility]  PATIENT: Joseph Barry DOB: 1980/04/03  REFERRING DOCTOR OR PCP: Sallee Provencal NP SOURCE: Patient, notes from primary care, ED notes  _________________________________   HISTORICAL  CHIEF COMPLAINT:  Chief Complaint  Patient presents with   Follow-up    Rm 2, alone. Here for 6 month syncope f/u. Pt has not had any episodes since last ov.     HISTORY OF PRESENT ILLNESS:  Joseph Barry.age  is a 41 y.o. man with syncope and presyncope.  Since the last visit, he has not had any more episodes of syncope.  However, he continues to have  episodes of feeling lightheadedness and woozy.   These occur a couple times a week.  He gets a hot sensation and sometimes nauseous.   He believes he is up and moving around when an episode starts.   He is no dehydrated.     He has never had tonic clonic activity.   He sweats a lot but has never had incontinence with the episodes.    He has had a few episodes where he feels lightheaded and weak but not the other symptoms.     He has NIDDM, elevated cholesterol and is otherwise healthy.   An EEG 09/04/2021 was normal.  3-day Holter monitor 09/12/2020 was normal.  From 08/29/2020: He is a 41 year old man who has had multiple. episodes of presyncope and syncope  He feels lightheaded and weak.   Peripheral vision is reduced and sounds are muted.    He then has loss of consciousness.   This has happened about 8-10 times in the last few years.   He then comes to after a few minutes.    He feels he was just out for a second but others note he is unresponsive x 5-7 minutes.   He recalls when he cones to being groggy for a minute or two.  After a few minutes he is back to baseline.    Last time he had this happened 05/19/2020 he went to the ED.  EKG showed NSR and normal intervals.  No ischemic changes.   He does not think anything was unusual about the days before the event.    He is usually standing when one occurs though  one occurred while sitting in a meeting.   Some occurred while doing physical activity - changing th tires, fixing the toilet.   One spell occurred while urinating in the middle of the night.  REVIEW OF SYSTEMS: Constitutional: No fevers, chills, sweats, or change in appetite Eyes: No visual changes, double vision, eye pain Ear, nose and throat: No hearing loss, ear pain, nasal congestion, sore throat Cardiovascular: No chest pain, palpitations Respiratory:  No shortness of breath at rest or with exertion.   No wheezes GastrointestinaI: No nausea, vomiting, diarrhea, abdominal pain, fecal incontinence Genitourinary:  No dysuria, urinary retention or frequency.  No nocturia. Musculoskeletal:  No neck pain, back pain Integumentary: No rash, pruritus, skin lesions Neurological: as above Psychiatric: No depression at this time.  No anxiety Endocrine: No palpitations, diaphoresis, change in appetite, change in weigh or increased thirst Hematologic/Lymphatic:  No anemia, purpura, petechiae. Allergic/Immunologic: No itchy/runny eyes, nasal congestion, recent allergic reactions, rashes  ALLERGIES: Allergies  Allergen Reactions   Shellfish Allergy Shortness Of Breath and Swelling   Tape     rash    HOME MEDICATIONS:  Current Outpatient Medications:    amitriptyline (ELAVIL) 25 MG tablet, TAKE 1 TABLET BY MOUTH AT  BEDTIME,  Disp: 90 tablet, Rfl: 0   Continuous Blood Gluc Receiver (FREESTYLE LIBRE 14 DAY READER) DEVI, USE ONE DEVICE EVERY 14 DAYS TO MONITOR BLOOD GLUCOSE, Disp: 6 each, Rfl: 2   Continuous Blood Gluc Sensor (FREESTYLE LIBRE 3 SENSOR) MISC, PLACE 1 SENSOR ON THE SKIN AND  CHANGE EVERY 14 DAYS . USE TO  CHECK BLOOD GLUCOSE CONTINUOUSLY, Disp: 7 each, Rfl: 3   Lancets (ONETOUCH DELICA PLUS LANCET30G) MISC, USE 3 TIMES DAILY, Disp: 300 each, Rfl: 3   MOUNJARO 7.5 MG/0.5ML Pen, INJECT THE CONTENTS OF ONE PEN  SUBCUTANEOUSLY WEEKLY AS  DIRECTED, Disp: 6 mL, Rfl: 3   ondansetron  (ZOFRAN ODT) 4 MG disintegrating tablet, Take 1 tablet (4 mg total) by mouth every 8 (eight) hours as needed for nausea or vomiting., Disp: 20 tablet, Rfl: 0   ONETOUCH VERIO test strip, USE TO TEST BLOOD GLUCOSE 3 TIMES DAILY, Disp: 150 strip, Rfl: 6   simvastatin (ZOCOR) 20 MG tablet, TAKE 1 TABLET BY MOUTH AT  BEDTIME, Disp: 90 tablet, Rfl: 3   triamcinolone cream (KENALOG) 0.1 %, Apply 1 application topically 2 (two) times daily., Disp: 45 g, Rfl: 2   triamcinolone ointment (KENALOG) 0.5 %, Apply 1 application topically 2 (two) times daily., Disp: 30 g, Rfl: 0  PAST MEDICAL HISTORY: Past Medical History:  Diagnosis Date   Depression    Diabetes mellitus type II, uncontrolled    Eczema    Finger laceration involving tendon 05/20/2014   right index, long, ring, small fingers - tendon/artery/nerve lac.   GERD (gastroesophageal reflux disease)    History of cardiac murmur as a child    History of MRSA infection ~ 2007   back   Hypertension    states under control with med., has been on med. x 1 yr.   Migraines    Sleep apnea    uses CPAP nightly    PAST SURGICAL HISTORY: Past Surgical History:  Procedure Laterality Date   NERVE, TENDON AND ARTERY REPAIR Right 05/24/2014   Procedure: RIGHT INDEX LONG RING SMALL/REPAIR TENDON, ARTERY NERVE;  Surgeon: Betha Loa, MD;  Location: [facility];  Service: Orthopedics;  Laterality: Right;   ORBITAL FRACTURE SURGERY Left 2003    FAMILY HISTORY: Family History  Problem Relation Age of Onset   Arthritis Maternal Grandmother    Diabetes Maternal Grandmother    Heart disease Maternal Grandmother    Hypertension Father    Diabetes Father    Colon polyps Father    Hypertension Mother    Heart disease Mother    COPD Mother    Cancer Paternal Grandmother        spine cancer   Hypertension Sister     SOCIAL HISTORY:  Social History   Socioeconomic History   Marital status: Married    Spouse name: vanessa   Number of  children: Not on file   Years of education: Not on file   Highest education level: Associate degree: occupational, Scientist, product/process development, or vocational program  Occupational History   Not on file  Tobacco Use   Smoking status: Every Day    Types: Cigars   Smokeless tobacco: Never   Tobacco comments:    5 cigars/day  Substance and Sexual Activity   Alcohol use: Yes    Alcohol/week: 0.0 standard drinks    Comment: occasionally   Drug use: No   Sexual activity: Not on file  Other Topics Concern   Not on file  Social History Narrative  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    Married for five years   Has step daughter.    Has rabbit and dog.       Lives alone   Right handed   Caffeine: 1 cup of coffee every month   Social Determinants of Health   Financial Resource Strain: Not on file  Food Insecurity: Not on file  Transportation Needs: Not on file  Physical Activity: Not on file  Stress: Not on file  Social Connections: Not on file  Intimate Partner Violence: Not on file     PHYSICAL EXAM  Vitals:   03/07/21 1039  BP: 113/70  Pulse: 74  Weight: 209 lb 8 oz (95 kg)  Height: 5\' 9"  (1.753 m)    Body mass index is 30.94 kg/m.  Orthostatic Vitls Supine  122/90   74 Sitting  134/101 78 Standin 129/97  83  General: The patient is well-developed and well-nourished and in no acute distress  HEENT:  Head is [place]/AT.  Sclera are anicteric.    Neck: No carotid bruits are noted.   Cardiovascular: The heart has a regular rate and rhythm with a normal S1 and S2. There were no murmurs, gallops or rubs.    Skin: Extremities are without rash or  edema.  Neurologic Exam  Mental status: The patient is alert and oriented x 3 at the time of the examination. The patient has apparent normal recent and remote memory, with an apparently normal attention span and concentration ability.   Speech is normal.  Cranial nerves: Extraocular movements are full.  Facial  strength and sensation was normal.. No obvious hearing deficits are noted.  Motor:  Muscle bulk is normal.   Tone is normal. Strength is  5 / 5 in all 4 extremities.   Sensory: Sensory testing is intact to pinprick, soft touch and vibration sensation in all 4 extremities.  Coordination: Cerebellar testing reveals good finger-nose-finger and heel-to-shin bilaterally.  Gait and station: Station is normal.   Gait is normal. Tandem gait is normal. Romberg is negative.   Reflexes: Deep tendon reflexes are symmetric and normal bilaterally.       DIAGNOSTIC DATA (LABS, IMAGING, TESTING) - I reviewed patient records, labs, notes, testing and imaging myself where available.  Lab Results  Component Value Date   WBC 5.2 06/01/2020   HGB 15.7 06/01/2020   HCT 46.4 06/01/2020   MCV 90.6 06/01/2020   PLT 424.0 (H) 06/01/2020      Component Value Date/Time   NA 136 05/19/2020 1312   K 4.5 05/19/2020 1312   CL 99 05/19/2020 1312   CO2 26 05/19/2020 1312   GLUCOSE 169 (H) 05/19/2020 1312   BUN 17 05/19/2020 1312   CREATININE 1.31 (H) 05/19/2020 1312   CREATININE 1.09 06/21/2015 1654   CALCIUM 9.5 05/19/2020 1312   PROT 8.2 (H) 05/19/2020 1312   ALBUMIN 4.2 05/19/2020 1312   AST 20 05/19/2020 1312   ALT 18 05/19/2020 1312   ALKPHOS 65 05/19/2020 1312   BILITOT 0.5 05/19/2020 1312   GFRNONAA >60 05/19/2020 1312   GFRAA >60 04/23/2017 2004   Lab Results  Component Value Date   CHOL 189 02/11/2020   HDL 37.30 (L) 02/11/2020   LDLCALC 134 (H) 02/11/2020   TRIG 88.0 02/11/2020   CHOLHDL 5 02/11/2020   Lab Results  Component Value Date   HGBA1C 6.0 (A) 02/14/2021   No results found for: [id number] Lab Results  Component Value Date  TSH 1.32 02/11/2020       ASSESSMENT AND PLAN  Syncope, unspecified syncope type  Episodic lightheadedness   As spells or doing better will hold off on further tests/treatment. If actual syncope spells return will send for tilt table test  and consider fludrocortisone or pyridostigmine based on results or empirically.   Rtc prn  Levan Aloia A. Felecia Shelling, MD, [facility] [id number], [id number] AM Certified in Neurology, Clinical Neurophysiology, Sleep Medicine and Neuroimaging  [facility] [address] [place], [place] [address] [phone]

## 2021-04-25 ENCOUNTER — Encounter: Payer: Self-pay | Admitting: Adult Health

## 2021-04-27 ENCOUNTER — Other Ambulatory Visit: Payer: Self-pay | Admitting: Adult Health

## 2021-04-27 DIAGNOSIS — E1165 Type 2 diabetes mellitus with hyperglycemia: Secondary | ICD-10-CM

## 2021-04-27 NOTE — Telephone Encounter (Signed)
Please advise 

## 2021-04-30 NOTE — Telephone Encounter (Signed)
OV on 02/14/21 notes:  ?He is currently maintained on metformin 500 mg daily and Mounjaro 7.5 mg weekly. ? ?Prescription refill request for Metformin 500mg  BID.  ? ?Called pt to get clarification. Pt states he's not taking Mounjaro due to not wanting to locate a pharmacy that has it (see pt message encounter). Pt states he's going back to what he's taking before; states told him some time ago to (when he was taking Metformin) to start taking it once a day & he's going back to that. Medication refilled. ?

## 2021-05-08 ENCOUNTER — Ambulatory Visit (INDEPENDENT_AMBULATORY_CARE_PROVIDER_SITE_OTHER): Payer: 59 | Admitting: Adult Health

## 2021-05-08 ENCOUNTER — Encounter: Payer: Self-pay | Admitting: Adult Health

## 2021-05-08 VITALS — BP 102/80 | HR 85 | Temp 98.6°F | Ht 69.0 in | Wt 192.0 lb

## 2021-05-08 DIAGNOSIS — Z1159 Encounter for screening for other viral diseases: Secondary | ICD-10-CM

## 2021-05-08 DIAGNOSIS — F5101 Primary insomnia: Secondary | ICD-10-CM

## 2021-05-08 DIAGNOSIS — Z0000 Encounter for general adult medical examination without abnormal findings: Secondary | ICD-10-CM | POA: Diagnosis not present

## 2021-05-08 DIAGNOSIS — E782 Mixed hyperlipidemia: Secondary | ICD-10-CM

## 2021-05-08 DIAGNOSIS — E119 Type 2 diabetes mellitus without complications: Secondary | ICD-10-CM | POA: Diagnosis not present

## 2021-05-08 MED ORDER — TIRZEPATIDE 5 MG/0.5ML ~~LOC~~ SOAJ: 5.0000 mg | SUBCUTANEOUS | 1 refills | Status: DC

## 2021-05-08 MED ORDER — TRAZODONE HCL 50 MG PO TABS: 25.0000 mg | ORAL_TABLET | Freq: Every evening | ORAL | 0 refills | Status: DC | PRN

## 2021-05-08 NOTE — Progress Notes (Signed)
? ?Subjective:  ? ? Patient ID: Joseph Barry, male    DOB: [birth year], 41 y.o.   MRN: [id number] ? ?HPI ?Patient presents for yearly preventative medicine examination. He is a pleasant 41 year old male who  has a past medical history of Depression, Diabetes mellitus type II, uncontrolled, Eczema, Finger laceration involving tendon (05/20/2014), GERD (gastroesophageal reflux disease), History of cardiac murmur as a child, History of MRSA infection (~ 2007), Hypertension, Migraines, and Sleep apnea. ? ?DM Type 2 -he is currently maintained on metformin 500 mg daily.  He was also on Mounjaro 7.5 mg weekly he was tolerating this medication well.  Unfortunately, his mail order pharmacy did not have the prescription in. He has missed one day of mounjaro. He continues to eat healthy and exercise. He has lost about 67 pounds.  ? ? ?Lab Results  ?Component Value Date  ? HGBA1C 6.0 (A) 02/14/2021  ? ?Wt Readings from Last 10 Encounters:  ?05/08/21 192 lb (87.1 kg)  ?03/07/21 209 lb 8 oz (95 kg)  ?02/14/21 217 lb (98.4 kg)  ?12/19/20 235 lb (106.6 kg)  ?11/17/20 247 lb (112 kg)  ?08/29/20 256 lb (116.1 kg)  ?06/01/20 259 lb (117.5 kg)  ?05/19/20 253 lb 12 oz (115.1 kg)  ?05/17/20 255 lb 3.2 oz (115.8 kg)  ?02/11/20 252 lb (114.3 kg)  ? ?Hyperlipidemia -prescribed simvastatin 20 mg daily.  He denies myalgia or fatigue while taking it but has not been taking it - he forgot about this medications  ? ?Insomnia - this is his biggest issue. He is having trouble falling asleep and staying asleep. Some nights he has no issues other nights he will be up to 3 am. In the past Elavil has made him feel groggy the next day.  ? ?All immunizations and health maintenance protocols were reviewed with the patient and needed orders were placed. ? ?Appropriate screening laboratory values were ordered for the patient including screening of hyperlipidemia, renal function and hepatic function. ? ?Medication reconciliation,  past medical history,  social history, problem list and allergies were reviewed in detail with the patient ? ?Goals were established with regard to weight loss, exercise, and  diet in compliance with medications ? ?He has no acute complaints.  ?Review of Systems  ?Constitutional: Negative.   ?HENT: Negative.    ?Eyes: Negative.   ?Respiratory: Negative.    ?Cardiovascular: Negative.   ?Gastrointestinal: Negative.   ?Endocrine: Negative.   ?Genitourinary: Negative.   ?Musculoskeletal: Negative.   ?Skin: Negative.   ?Allergic/Immunologic: Negative.   ?Neurological: Negative.   ?Hematological: Negative.   ?Psychiatric/Behavioral:  Positive for sleep disturbance.   ?All other systems reviewed and are negative. ? ?Past Medical History:  ?Diagnosis Date  ? Depression   ? Diabetes mellitus type II, uncontrolled   ? Eczema   ? Finger laceration involving tendon 05/20/2014  ? right index, long, ring, small fingers - tendon/artery/nerve lac.  ? GERD (gastroesophageal reflux disease)   ? History of cardiac murmur as a child   ? History of MRSA infection ~ 2007  ? back  ? Hypertension   ? states under control with med., has been on med. x 1 yr.  ? Migraines   ? Sleep apnea   ? uses CPAP nightly  ? ? ?Social History  ? ?Socioeconomic History  ? Marital status: Married  ?  Spouse name: vanessa  ? Number of children: Not on file  ? Years of education: Not on file  ? Highest education level:  Associate degree: occupational, technical, or vocational program  ?Occupational History  ? Not on file  ?Tobacco Use  ? Smoking status: Every Day  ?  Types: Cigars  ? Smokeless tobacco: Never  ? Tobacco comments:  ?  5 cigars/day  ?Substance and Sexual Activity  ? Alcohol use: Yes  ?  Alcohol/week: 0.0 standard drinks  ?  Comment: occasionally  ? Drug use: No  ? Sexual activity: Not on file  ?Other Topics Concern  ? Not on file  ?Social History Narrative  ? 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   ? Married for five years  ? Has step daughter.    ? Has rabbit and dog.   ?   ? Lives alone  ? Right handed  ? Caffeine: 1 cup of coffee every month  ? ?Social Determinants of Health  ? ?Financial Resource Strain: Not on file  ?Food Insecurity: Not on file  ?Transportation Needs: Not on file  ?Physical Activity: Not on file  ?Stress: Not on file  ?Social Connections: Not on file  ?Intimate Partner Violence: Not on file  ? ? ?Past Surgical History:  ?Procedure Laterality Date  ? NERVE, TENDON AND ARTERY REPAIR Right 05/24/2014  ? Procedure: RIGHT INDEX LONG RING SMALL/REPAIR TENDON, ARTERY NERVE;  Surgeon: Betha Loa, MD;  Location: [facility];  Service: Orthopedics;  Laterality: Right;  ? ORBITAL FRACTURE SURGERY Left 2003  ? ? ?Family History  ?Problem Relation Age of Onset  ? Arthritis Maternal Grandmother   ? Diabetes Maternal Grandmother   ? Heart disease Maternal Grandmother   ? Hypertension Father   ? Diabetes Father   ? Colon polyps Father   ? Hypertension Mother   ? Heart disease Mother   ? COPD Mother   ? Cancer Paternal Grandmother   ?     spine cancer  ? Hypertension Sister   ? ? ?Allergies  ?Allergen Reactions  ? Shellfish Allergy Shortness Of Breath and Swelling  ? Tape   ?  rash  ? ? ?Current Outpatient Medications on File Prior to Visit  ?Medication Sig Dispense Refill  ? amitriptyline (ELAVIL) 25 MG tablet TAKE 1 TABLET BY MOUTH AT  BEDTIME 90 tablet 0  ? Continuous Blood Gluc Receiver (FREESTYLE LIBRE 14 DAY READER) DEVI USE ONE DEVICE EVERY 14 DAYS TO MONITOR BLOOD GLUCOSE 6 each 2  ? Continuous Blood Gluc Sensor (FREESTYLE LIBRE 3 SENSOR) MISC PLACE 1 SENSOR ON THE SKIN AND  CHANGE EVERY 14 DAYS . USE TO  CHECK BLOOD GLUCOSE CONTINUOUSLY 7 each 3  ? Lancets (ONETOUCH DELICA PLUS LANCET30G) MISC USE 3 TIMES DAILY 300 each 3  ? metFORMIN (GLUCOPHAGE) 500 MG tablet Take 1 tablet (500 mg total) by mouth daily with breakfast. 90 tablet 1  ? MOUNJARO 7.5 MG/0.5ML Pen INJECT THE CONTENTS OF ONE PEN  SUBCUTANEOUSLY WEEKLY AS  DIRECTED 6  mL 3  ? ondansetron (ZOFRAN ODT) 4 MG disintegrating tablet Take 1 tablet (4 mg total) by mouth every 8 (eight) hours as needed for nausea or vomiting. 20 tablet 0  ? ONETOUCH VERIO test strip USE TO TEST BLOOD GLUCOSE 3 TIMES DAILY 150 strip 6  ? simvastatin (ZOCOR) 20 MG tablet TAKE 1 TABLET BY MOUTH AT  BEDTIME 90 tablet 3  ? triamcinolone cream (KENALOG) 0.1 % Apply 1 application topically 2 (two) times daily. 45 g 2  ? triamcinolone ointment (KENALOG) 0.5 % Apply 1 application topically 2 (two) times daily.  30 g 0  ? glipiZIDE (GLUCOTROL XL) 10 MG 24 hr tablet Take 10 mg by mouth daily.    ? JARDIANCE 25 MG TABS tablet Take 25 mg by mouth daily.    ? ?No current facility-administered medications on file prior to visit.  ? ? ?BP 102/80   Pulse 85   Temp 98.6 ?F (37 ?C) (Oral)   Ht 5\' 9"  (1.753 m)   Wt 192 lb (87.1 kg)   SpO2 100%   BMI 28.35 kg/m?  ? ? ?   ?Objective:  ? Physical Exam ?Vitals and nursing note reviewed.  ?Constitutional:   ?   General: He is not in acute distress. ?   Appearance: Normal appearance. He is well-developed and normal weight.  ?HENT:  ?   Head: Normocephalic and atraumatic.  ?   Right Ear: Tympanic membrane, ear canal and external ear normal. There is no impacted cerumen.  ?   Left Ear: Tympanic membrane, ear canal and external ear normal. There is no impacted cerumen.  ?   Nose: Nose normal. No congestion or rhinorrhea.  ?   Mouth/Throat:  ?   Mouth: Mucous membranes are moist.  ?   Pharynx: Oropharynx is clear. No oropharyngeal exudate or posterior oropharyngeal erythema.  ?Eyes:  ?   General:     ?   Right eye: No discharge.     ?   Left eye: No discharge.  ?   Extraocular Movements: Extraocular movements intact.  ?   Conjunctiva/sclera: Conjunctivae normal.  ?   Pupils: Pupils are equal, round, and reactive to light.  ?Neck:  ?   Vascular: No carotid bruit.  ?   Trachea: No tracheal deviation.  ?Cardiovascular:  ?   Rate and Rhythm: Normal rate and regular rhythm.  ?    Pulses: Normal pulses.  ?   Heart sounds: Normal heart sounds. No murmur heard. ?  No friction rub. No gallop.  ?Pulmonary:  ?   Effort: Pulmonary effort is normal. No respiratory distress.  ?   Breath sounds: 

## 2021-05-09 LAB — HEPATITIS C ANTIBODY
Hepatitis C Ab: NONREACTIVE
SIGNAL TO CUT-OFF: 0.09 (ref <1.00)

## 2021-05-09 LAB — LIPID PANEL
Cholesterol: 167 mg/dL (ref 0–200)
HDL: 48.6 mg/dL (ref >39.00)
LDL Cholesterol: 103 mg/dL — ABNORMAL HIGH (ref 0–99)
NonHDL: 118.53
Total CHOL/HDL Ratio: 3
Triglycerides: 80 mg/dL (ref 0.0–149.0)
VLDL: 16 mg/dL (ref 0.0–40.0)

## 2021-05-09 LAB — CBC WITH DIFFERENTIAL/PLATELET
Basophils Absolute: 0.1 10*3/uL (ref 0.0–0.1)
Basophils Relative: 0.7 % (ref 0.0–3.0)
Eosinophils Absolute: 0.1 10*3/uL (ref 0.0–0.7)
Eosinophils Relative: 1.3 % (ref 0.0–5.0)
HCT: 44.4 % (ref 39.0–52.0)
Hemoglobin: 14.9 g/dL (ref 13.0–17.0)
Lymphocytes Relative: 19.9 % (ref 12.0–46.0)
Lymphs Abs: 1.7 10*3/uL (ref 0.7–4.0)
MCHC: 33.6 g/dL (ref 30.0–36.0)
MCV: 94.3 fl (ref 78.0–100.0)
Monocytes Absolute: 0.5 10*3/uL (ref 0.1–1.0)
Monocytes Relative: 5.2 % (ref 3.0–12.0)
Neutro Abs: 6.3 10*3/uL (ref 1.4–7.7)
Neutrophils Relative %: 72.9 % (ref 43.0–77.0)
Platelets: 430 10*3/uL — ABNORMAL HIGH (ref 150.0–400.0)
RBC: 4.71 Mil/uL (ref 4.22–5.81)
RDW: 13.8 % (ref 11.5–15.5)
WBC: 8.6 10*3/uL (ref 4.0–10.5)

## 2021-05-09 LAB — COMPREHENSIVE METABOLIC PANEL
ALT: 11 U/L (ref 0–53)
AST: 14 U/L (ref 0–37)
Albumin: 4.4 g/dL (ref 3.5–5.2)
Alkaline Phosphatase: 55 U/L (ref 39–117)
BUN: 14 mg/dL (ref 6–23)
CO2: 29 mEq/L (ref 19–32)
Calcium: 9.8 mg/dL (ref 8.4–10.5)
Chloride: 103 mEq/L (ref 96–112)
Creatinine, Ser: 1.1 mg/dL (ref 0.40–1.50)
GFR: 83.71 mL/min (ref >60.00)
Glucose, Bld: 94 mg/dL (ref 70–99)
Potassium: 4.1 mEq/L (ref 3.5–5.1)
Sodium: 140 mEq/L (ref 135–145)
Total Bilirubin: 0.5 mg/dL (ref 0.2–1.2)
Total Protein: 7.3 g/dL (ref 6.0–8.3)

## 2021-05-09 LAB — HEMOGLOBIN A1C: Hgb A1c MFr Bld: 5.5 % (ref 4.6–6.5)

## 2021-05-09 LAB — MICROALBUMIN / CREATININE URINE RATIO
Creatinine,U: 424.2 mg/dL
Microalb Creat Ratio: 0.4 mg/g (ref 0.0–30.0)
Microalb, Ur: 1.7 mg/dL (ref 0.0–1.9)

## 2021-05-09 LAB — TSH: TSH: 0.78 u[IU]/mL (ref 0.35–5.50)

## 2021-06-04 ENCOUNTER — Other Ambulatory Visit: Payer: Self-pay | Admitting: Adult Health

## 2021-06-04 DIAGNOSIS — F5101 Primary insomnia: Secondary | ICD-10-CM

## 2021-06-05 ENCOUNTER — Other Ambulatory Visit: Payer: Self-pay | Admitting: Adult Health

## 2021-06-05 DIAGNOSIS — E1165 Type 2 diabetes mellitus with hyperglycemia: Secondary | ICD-10-CM

## 2021-06-12 ENCOUNTER — Ambulatory Visit (INDEPENDENT_AMBULATORY_CARE_PROVIDER_SITE_OTHER): Payer: 59 | Admitting: Adult Health

## 2021-06-12 VITALS — BP 110/80 | HR 85 | Temp 98.4°F | Ht 69.0 in | Wt 196.0 lb

## 2021-06-12 DIAGNOSIS — R55 Syncope and collapse: Secondary | ICD-10-CM

## 2021-06-12 NOTE — Progress Notes (Signed)
Subjective:    Patient ID: Joseph Barry, male    DOB: 10/30/1980, 41 y.o.   MRN: [id number]  HPI 41 year old male who  has a past medical history of Depression, Diabetes mellitus type II, uncontrolled, Eczema, Finger laceration involving tendon (05/20/2014), GERD (gastroesophageal reflux disease), History of cardiac murmur as a child, History of MRSA infection (~ 2007), Hypertension, Migraines, and Sleep apnea.  He presents to the office today for  pre syncopal episdoe.   His symptoms presented 1 week ago.  He reports that he was at work and a Radio broadcast assistant dropped him in the leg, [name] has a hard time remembering his name, went through the conversation and then went back to his desk when he started to "feel funny" and checked his blood sugar and it was 120.  He was sitting at his desk and when he went to stand up he became warm, experienced blurred vision, and excessive sweating.  He sat down for a few moments and then stood back up and his symptoms returned.  He then sat down again and closed his eyes and waited for her symptoms to resolve.  Reports that it showed on his watch that his heart rate dropped to 40.  His symptoms lasted approximately 30 minutes.  Is not had any symptoms since  About this time last year he did have a syncopal episode in the setting of abdominal pain.  He was seen in the emergency room and was also evaluated by neurology at which time a TEE was done which was normal and he also had a Zio patch placed which showed  Predominant rhythm was normal sinus rhythm with average heart rate 88bpm and ranged from 49 to 136bpm. Rare PAC  He had not had any symptoms since until a week ago.  Review of Systems See HPI   Past Medical History:  Diagnosis Date   Depression    Diabetes mellitus type II, uncontrolled    Eczema    Finger laceration involving tendon 05/20/2014   right index, long, ring, small fingers - tendon/artery/nerve lac.   GERD (gastroesophageal reflux  disease)    History of cardiac murmur as a child    History of MRSA infection ~ 2007   back   Hypertension    states under control with med., has been on med. x 1 yr.   Migraines    Sleep apnea    uses CPAP nightly    Social History   Socioeconomic History   Marital status: Married    Spouse name: vanessa   Number of children: Not on file   Years of education: Not on file   Highest education level: Associate degree: occupational, Scientist, product/process development, or vocational program  Occupational History   Not on file  Tobacco Use   Smoking status: Every Day    Types: Cigars   Smokeless tobacco: Never   Tobacco comments:    5 cigars/day  Substance and Sexual Activity   Alcohol use: Yes    Alcohol/week: 0.0 standard drinks    Comment: occasionally   Drug use: No   Sexual activity: Not on file  Other Topics Concern   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    Married for five years   Has step daughter.    Has rabbit and dog.       Lives alone   Right handed   Caffeine: 1 cup of coffee every month  Social Determinants of Health   Financial Resource Strain: Not on file  Food Insecurity: Not on file  Transportation Needs: Not on file  Physical Activity: Not on file  Stress: Not on file  Social Connections: Not on file  Intimate Partner Violence: Not on file    Past Surgical History:  Procedure Laterality Date   NERVE, TENDON AND ARTERY REPAIR Right 05/24/2014   Procedure: RIGHT INDEX LONG RING SMALL/REPAIR TENDON, ARTERY NERVE;  Surgeon: Betha LoaKevin Kuzma, MD;  Location: [facility];  Service: Orthopedics;  Laterality: Right;   ORBITAL FRACTURE SURGERY Left 2003    Family History  Problem Relation Age of Onset   Arthritis Maternal Grandmother    Diabetes Maternal Grandmother    Heart disease Maternal Grandmother    Hypertension Father    Diabetes Father    Colon polyps Father    Hypertension Mother     Heart disease Mother    COPD Mother    Cancer Paternal Grandmother        spine cancer   Hypertension Sister     Allergies  Allergen Reactions   Shellfish Allergy Shortness Of Breath and Swelling   Tape     rash    Current Outpatient Medications on File Prior to Visit  Medication Sig Dispense Refill   Continuous Blood Gluc Receiver (FREESTYLE LIBRE 14 DAY READER) DEVI USE ONE DEVICE EVERY 14 DAYS TO MONITOR BLOOD GLUCOSE 6 each 2   Continuous Blood Gluc Sensor (FREESTYLE LIBRE 3 SENSOR) MISC PLACE 1 SENSOR ON THE SKIN AND  CHANGE EVERY 14 DAYS . USE TO  CHECK BLOOD GLUCOSE CONTINUOUSLY 7 each 3   Lancets (ONETOUCH DELICA PLUS LANCET30G) MISC USE 3 TIMES DAILY 300 each 3   ondansetron (ZOFRAN ODT) 4 MG disintegrating tablet Take 1 tablet (4 mg total) by mouth every 8 (eight) hours as needed for nausea or vomiting. 20 tablet 0   ONETOUCH VERIO test strip USE TO TEST BLOOD GLUCOSE 3 TIMES DAILY 150 strip 6   simvastatin (ZOCOR) 20 MG tablet TAKE 1 TABLET BY MOUTH AT  BEDTIME 90 tablet 3   tirzepatide (MOUNJARO) 5 MG/0.5ML Pen Inject 5 mg into the skin once a week. 6 mL 1   traZODone (DESYREL) 50 MG tablet TAKE 1/2 TO 1 TABLET(25 TO 50 MG) BY MOUTH AT BEDTIME AS NEEDED FOR SLEEP 30 tablet 0   No current facility-administered medications on file prior to visit.    BP 110/80   Pulse 85   Temp 98.4 F (36.9 C) (Oral)   Ht 5\' 9"  (1.753 m)   Wt 196 lb (88.9 kg)   SpO2 95%   BMI 28.94 kg/m       Objective:   Physical Exam Vitals and nursing note reviewed.  Constitutional:      Appearance: Normal appearance.  Cardiovascular:     Rate and Rhythm: Normal rate and regular rhythm. Occasional Extrasystoles (PAC) are present.    Pulses: Normal pulses.     Heart sounds: Normal heart sounds.  Pulmonary:     Effort: Pulmonary effort is normal.     Breath sounds: Normal breath sounds.  Musculoskeletal:        General: Normal range of motion.  Skin:    General: Skin is warm and dry.   Neurological:     General: No focal deficit present.     Mental Status: He is alert and oriented to person, place, and time.  Psychiatric:  Mood and Affect: Mood normal.        Behavior: Behavior normal.        Thought Content: Thought content normal.        Judgment: Judgment normal.       Assessment & Plan:  1. Pre-syncope -In the setting of recurrent presyncope, likely vasovagal.  Will refer to cardiology for further testing. - Ambulatory referral to Cardiology  Shirline Frees, NP   Time of total for care on the day of the encounter 30 minutes. This includes time spent in both face-to-face and non-face-to-face activities including preparing for the visit, reviewing the chart, time spent with patient, evaluation and counseling patient/family/caregiver, coordinating care, and time spent documenting in the chart which was performed on the date of service (06/12/2021). Note: this excludes any time spent performing billable procedures or separate charges (such as time spent counseling smoking cessation); these charges are billed separately.

## 2021-06-13 ENCOUNTER — Other Ambulatory Visit: Payer: Self-pay

## 2021-06-13 ENCOUNTER — Emergency Department (HOSPITAL_BASED_OUTPATIENT_CLINIC_OR_DEPARTMENT_OTHER): Payer: 59

## 2021-06-13 ENCOUNTER — Emergency Department (HOSPITAL_BASED_OUTPATIENT_CLINIC_OR_DEPARTMENT_OTHER)
Admission: EM | Admit: 2021-06-13 | Discharge: 2021-06-13 | Disposition: A | Discharge status: Home / Self Care | Payer: 59 | Attending: Emergency Medicine | Admitting: Emergency Medicine

## 2021-06-13 ENCOUNTER — Encounter (HOSPITAL_BASED_OUTPATIENT_CLINIC_OR_DEPARTMENT_OTHER): Payer: Self-pay | Admitting: Emergency Medicine

## 2021-06-13 DIAGNOSIS — E119 Type 2 diabetes mellitus without complications: Secondary | ICD-10-CM | POA: Diagnosis not present

## 2021-06-13 DIAGNOSIS — N4830 Priapism, unspecified: Secondary | ICD-10-CM | POA: Diagnosis not present

## 2021-06-13 DIAGNOSIS — I10 Essential (primary) hypertension: Secondary | ICD-10-CM | POA: Diagnosis not present

## 2021-06-13 DIAGNOSIS — R55 Syncope and collapse: Secondary | ICD-10-CM | POA: Insufficient documentation

## 2021-06-13 LAB — COMPREHENSIVE METABOLIC PANEL
ALT: 13 U/L (ref 0–44)
AST: 17 U/L (ref 15–41)
Albumin: 3.9 g/dL (ref 3.5–5.0)
Alkaline Phosphatase: 56 U/L (ref 38–126)
Anion gap: 5 (ref 5–15)
BUN: 12 mg/dL (ref 6–20)
CO2: 27 mmol/L (ref 22–32)
Calcium: 9 mg/dL (ref 8.9–10.3)
Chloride: 106 mmol/L (ref 98–111)
Creatinine, Ser: 1.07 mg/dL (ref 0.61–1.24)
GFR, Estimated: >60 mL/min (ref >60)
Glucose, Bld: 106 mg/dL — ABNORMAL HIGH (ref 70–99)
Potassium: 3.7 mmol/L (ref 3.5–5.1)
Sodium: 138 mmol/L (ref 135–145)
Total Bilirubin: 0.5 mg/dL (ref 0.3–1.2)
Total Protein: 7.2 g/dL (ref 6.5–8.1)

## 2021-06-13 LAB — CBC WITH DIFFERENTIAL/PLATELET
Abs Immature Granulocytes: 0.02 10*3/uL (ref 0.00–0.07)
Basophils Absolute: 0 10*3/uL (ref 0.0–0.1)
Basophils Relative: 0 %
Eosinophils Absolute: 0.1 10*3/uL (ref 0.0–0.5)
Eosinophils Relative: 1 %
HCT: 41.2 % (ref 39.0–52.0)
Hemoglobin: 14.3 g/dL (ref 13.0–17.0)
Immature Granulocytes: 0 %
Lymphocytes Relative: 10 %
Lymphs Abs: 0.8 10*3/uL (ref 0.7–4.0)
MCH: 31.5 pg (ref 26.0–34.0)
MCHC: 34.7 g/dL (ref 30.0–36.0)
MCV: 90.7 fL (ref 80.0–100.0)
Monocytes Absolute: 0.3 10*3/uL (ref 0.1–1.0)
Monocytes Relative: 4 %
Neutro Abs: 6.3 10*3/uL (ref 1.7–7.7)
Neutrophils Relative %: 85 %
Platelets: 409 10*3/uL — ABNORMAL HIGH (ref 150–400)
RBC: 4.54 MIL/uL (ref 4.22–5.81)
RDW: 13.1 % (ref 11.5–15.5)
WBC: 7.5 10*3/uL (ref 4.0–10.5)
nRBC: 0 % (ref 0.0–0.2)

## 2021-06-13 LAB — URINALYSIS, ROUTINE W REFLEX MICROSCOPIC
Bilirubin Urine: NEGATIVE
Glucose, UA: NEGATIVE mg/dL
Hgb urine dipstick: NEGATIVE
Ketones, ur: NEGATIVE mg/dL
Leukocytes,Ua: NEGATIVE
Nitrite: NEGATIVE
Protein, ur: NEGATIVE mg/dL
Specific Gravity, Urine: 1.02 (ref 1.005–1.030)
pH: 6 (ref 5.0–8.0)

## 2021-06-13 LAB — CBG MONITORING, ED: Glucose-Capillary: 100 mg/dL — ABNORMAL HIGH (ref 70–99)

## 2021-06-13 LAB — TROPONIN I (HIGH SENSITIVITY): Troponin I (High Sensitivity): 5 ng/L (ref <18)

## 2021-06-13 MED ORDER — ONDANSETRON HCL 4 MG/2ML IJ SOLN
4.0000 mg | Freq: Once | INTRAMUSCULAR | Status: AC
  Administered 2021-06-13: 4 mg via INTRAVENOUS
  Filled 2021-06-13: qty 2

## 2021-06-13 MED ORDER — PHENYLEPHRINE 200 MCG/ML FOR PRIAPISM / HYPOTENSION
200.0000 ug | INTRAMUSCULAR | Status: DC | PRN
  Administered 2021-06-13: 200 ug via INTRACAVERNOUS
  Filled 2021-06-13: qty 50

## 2021-06-13 MED ORDER — LIDOCAINE HCL 2 % IJ SOLN
10.0000 mL | Freq: Once | INTRAMUSCULAR | Status: AC
  Administered 2021-06-13: 200 mg
  Filled 2021-06-13: qty 20

## 2021-06-13 MED ORDER — SODIUM CHLORIDE 0.9 % IV BOLUS
1000.0000 mL | Freq: Once | INTRAVENOUS | Status: AC
  Administered 2021-06-13: 1000 mL via INTRAVENOUS

## 2021-06-13 MED ORDER — SODIUM CHLORIDE 0.9 % IV SOLN
INTRAVENOUS | Status: DC
Start: 2021-06-13 — End: 2021-06-13

## 2021-06-13 MED ORDER — MORPHINE SULFATE (PF) 4 MG/ML IV SOLN
4.0000 mg | Freq: Once | INTRAVENOUS | Status: AC
  Administered 2021-06-13: 4 mg via INTRAVENOUS
  Filled 2021-06-13: qty 1

## 2021-06-13 NOTE — ED Notes (Signed)
ED Provider at bedside. 

## 2021-06-13 NOTE — ED Provider Notes (Signed)
[facility] EMERGENCY DEPARTMENT Provider Note   CSN: [id number] Arrival date & time: 06/13/21  1119     History  Chief Complaint  Patient presents with   Loss of Consciousness    Joseph Barry is a 41 y.o. male.  Pt is a 41 yo male with a pmhx significant for gerd, migraines, htn, sleep apnea, dm2, depression, and syncope.  Pt has had syncopal events for several years.  He thinks it's been going on since 2014.  The pt has seen his pcp, cardiology and neurology.  No one has figured out why he is passing out.  Today, pt came inside after walking his dog.  He only walked for about 5 minutes.  He was on his front porch and started to feel dizzy and lightheaded.  He then passed out.  His wife showed me the video from their doorbell camera.  He is walking, starts to stagger, then goes down.  He denies pain from the fall.  Pt denies cp.  No sob.  He also mentions that he woke up this am with an erection.  His pcp put him on trazodone on 5/2 for his insomnia.  Pt said he could not sleep last night, so he took another trazodone.      Home Medications Prior to Admission medications   Medication Sig Start Date End Date Taking? Authorizing Provider  Continuous Blood Gluc Receiver (FREESTYLE LIBRE 14 DAY READER) DEVI USE ONE DEVICE EVERY 14 DAYS TO MONITOR BLOOD GLUCOSE 12/12/20   Nafziger, Tommi Rumps, NP  Continuous Blood Gluc Sensor (FREESTYLE LIBRE 3 SENSOR) MISC PLACE 1 SENSOR ON THE SKIN AND  CHANGE EVERY 14 DAYS . USE TO  CHECK BLOOD GLUCOSE CONTINUOUSLY 02/13/21   Nafziger, Tommi Rumps, NP  Lancets (ONETOUCH DELICA PLUS Q000111Q) [place] USE 3 TIMES DAILY 01/02/21   Nafziger, Tommi Rumps, NP  ondansetron (ZOFRAN ODT) 4 MG disintegrating tablet Take 1 tablet (4 mg total) by mouth every 8 (eight) hours as needed for nausea or vomiting. 11/21/20   Nafziger, Tommi Rumps, NP  ONETOUCH VERIO test strip USE TO TEST BLOOD GLUCOSE 3 TIMES DAILY 12/05/20   Nafziger, Tommi Rumps, NP  simvastatin (ZOCOR) 20 MG tablet TAKE 1 TABLET  BY MOUTH AT  BEDTIME 02/13/21   Nafziger, Tommi Rumps, NP  tirzepatide [facility]) 5 MG/0.5ML Pen Inject 5 mg into the skin once a week. 05/08/21   Dorothyann Peng, NP      Allergies    Shellfish allergy and Tape    Review of Systems   Review of Systems  Genitourinary:        Priapism  Neurological:  Positive for syncope.  All other systems reviewed and are negative.  Physical Exam Updated Vital Signs BP 117/80   Pulse (!) 58   Temp 98.2 F (36.8 C) (Oral)   Resp 14   Ht 5\' 9"  (1.753 m)   Wt 86.2 kg   SpO2 100%   BMI 28.06 kg/m  Physical Exam Vitals and nursing note reviewed.  Constitutional:      Appearance: Normal appearance.  HENT:     Head: Normocephalic and atraumatic.     Right Ear: External ear normal.     Left Ear: External ear normal.     Nose: Nose normal.     Mouth/Throat:     Mouth: Mucous membranes are moist.     Pharynx: Oropharynx is clear.  Eyes:     Extraocular Movements: Extraocular movements intact.     Conjunctiva/sclera: Conjunctivae normal.  Pupils: Pupils are equal, round, and reactive to light.  Cardiovascular:     Rate and Rhythm: Normal rate and regular rhythm.     Pulses: Normal pulses.     Heart sounds: Normal heart sounds.  Pulmonary:     Effort: Pulmonary effort is normal.     Breath sounds: Normal breath sounds.  Abdominal:     General: Abdomen is flat. Bowel sounds are normal.     Palpations: Abdomen is soft.  Genitourinary:    Comments: Evidence of priapism.  No engorgement of the glans penis. Musculoskeletal:        General: Normal range of motion.     Cervical back: Normal range of motion and neck supple.  Skin:    General: Skin is warm.     Capillary Refill: Capillary refill takes less than 2 seconds.  Neurological:     General: No focal deficit present.     Mental Status: He is alert and oriented to person, place, and time.  Psychiatric:        Mood and Affect: Mood normal.    ED Results / Procedures / Treatments    Labs (all labs ordered are listed, but only abnormal results are displayed) Labs Reviewed  CBC WITH DIFFERENTIAL/PLATELET - Abnormal; Notable for the following components:      Result Value   Platelets 409 (*)    All other components within normal limits  COMPREHENSIVE METABOLIC PANEL - Abnormal; Notable for the following components:   Glucose, Bld 106 (*)    All other components within normal limits  CBG MONITORING, ED - Abnormal; Notable for the following components:   Glucose-Capillary 100 (*)    All other components within normal limits  URINALYSIS, ROUTINE W REFLEX MICROSCOPIC  TROPONIN I (HIGH SENSITIVITY)    EKG EKG Interpretation  Date/Time:  Wednesday June 13 2021 11:34:05 EDT Ventricular Rate:  70 PR Interval:  147 QRS Duration: 80 QT Interval:  452 QTC Calculation: 488 R Axis:   34 Text Interpretation: Sinus rhythm Borderline prolonged QT interval QTc has lengthened Confirmed by Isla Pence [phone]) on 06/13/2021 1:45:44 PM  Radiology DG Chest 2 View  Result Date: 06/13/2021 CLINICAL DATA:  Syncope. EXAM: CHEST - 2 VIEW COMPARISON:  Chest radiograph April 23, 2017 FINDINGS: The heart size and mediastinal contours are within normal limits. No focal consolidation. No pleural effusion. No pneumothorax. The visualized skeletal structures are unremarkable. IMPRESSION: No acute cardiopulmonary disease. Electronically Signed   By: Dahlia Bailiff M.D.   On: 06/13/2021 12:39   CT Head Wo Contrast  Result Date: 06/13/2021 CLINICAL DATA:  Near syncopal episode. EXAM: CT HEAD WITHOUT CONTRAST TECHNIQUE: Contiguous axial images were obtained from the base of the skull through the vertex without intravenous contrast. RADIATION DOSE REDUCTION: This exam was performed according to the departmental dose-optimization program which includes automated exposure control, adjustment of the mA and/or kV according to patient size and/or use of iterative reconstruction technique. COMPARISON:   None Available. FINDINGS: Brain: The ventricles are normal in size and configuration. No extra-axial fluid collections are identified. The gray-white differentiation is maintained. No CT findings for acute hemispheric infarction or intracranial hemorrhage. No mass lesions. The brainstem and cerebellum are normal. Vascular: No hyperdense vessels or obvious aneurysm. Skull: No acute skull fracture.  No bone lesion. Sinuses/Orbits: The paranasal sinuses and mastoid air cells are clear. The globes are intact. Other: No scalp lacerations or hematoma. Evidence of prior left orbital floor fracture with repair. IMPRESSION: No acute  intracranial findings. Electronically Signed   By: Marijo Sanes M.D.   On: 06/13/2021 12:35    Procedures Irrigate corpus cavern, priapism  Date/Time: 06/13/2021 1:55 PM Performed by: Isla Pence, MD Authorized by: Isla Pence, MD  Consent: Verbal consent obtained. Risks and benefits: risks, benefits and alternatives were discussed Consent given by: patient Patient identity confirmed: verbally with patient Preparation: Patient was prepped and draped in the usual sterile fashion. Local anesthesia used: yes Anesthesia: digital block  Anesthesia: Local anesthesia used: yes Local Anesthetic: lidocaine 2% without epinephrine Anesthetic total: 5 mL  Sedation: Patient sedated: no  Patient tolerance: patient tolerated the procedure well with no immediate complications Comments: Initially 200 mcg phenylephrine.  That did not help, so blood was aspirated until detumescence occurred.      Medications Ordered in ED Medications  sodium chloride 0.9 % bolus 1,000 mL (0 mLs Intravenous Stopped 06/13/21 1347)    And  0.9 %  sodium chloride infusion ( Intravenous New Bag/Given 06/13/21 1349)  phenylephrine 200 mcg / ml CONC. DILUTION INJ (ED / Urology USE ONLY) (200 mcg Intracavernosal Given by Other 06/13/21 1234)  lidocaine (XYLOCAINE) 2 % (with pres) injection 200 mg (200  mg Infiltration Given by Other 06/13/21 1234)  morphine (PF) 4 MG/ML injection 4 mg (4 mg Intravenous Given 06/13/21 1249)  ondansetron (ZOFRAN) injection 4 mg (4 mg Intravenous Given 06/13/21 1249)    ED Course/ Medical Decision Making/ A&P                           Medical Decision Making Amount and/or Complexity of Data Reviewed Labs: ordered. Radiology: ordered. ECG/medicine tests: ordered.  Risk Prescription drug management.   This patient presents to the ED for concern of syncope, this involves an extensive number of treatment options, and is a complaint that carries with it a high risk of complications and morbidity.  The differential diagnosis includes cardiac/neurologic, orthostatic, vasovagal   Co morbidities that complicate the patient evaluation  erd, migraines, htn, sleep apnea, dm2, depression, and syncope   Additional history obtained:  Additional history obtained from epic chart review External records from outside source obtained and reviewed including wife   Lab Tests:  I Ordered, and personally interpreted labs.  The pertinent results include:  cbc nl, cmp nl, trop nl   Imaging Studies ordered:  I ordered imaging studies including cxr  I independently visualized and interpreted imaging which showed  CXR:   IMPRESSION:  No acute cardiopulmonary disease.  CT head:   IMPRESSION:  No acute intracranial findings.   I agree with the radiologist interpretation   Cardiac Monitoring:  The patient was maintained on a cardiac monitor.  I personally viewed and interpreted the cardiac monitored which showed an underlying rhythm of: nsr   Medicines ordered and prescription drug management:  I ordered medication including ivfs  for dehydration  Reevaluation of the patient after these medicines showed that the patient improved I have reviewed the patients home medicines and have made adjustments as needed   Critical Interventions:  Aspiration of blood  from corpus cavernosum   Problem List / ED Course:  Syncope:  No etiology found here.  Syncope has been intermittent for 9 years, so I don't think an admission would help.  Pt has been referred to cards by his pcp. Priapism:  Detumescence achieved after aspiration of blood.  Priapism is likely due to trazodone.  Pt told to stop this drug.  He is referred to urology.   Reevaluation:  After the interventions noted above, I reevaluated the patient and found that they have :improved   Social Determinants of Health:  Lives at home with his wife.   Dispostion:  After consideration of the diagnostic results and the patients response to treatment, I feel that the patent would benefit from discharge with outpatient f/u.          Final Clinical Impression(s) / ED Diagnoses Final diagnoses:  Syncope, unspecified syncope type  Priapism    Rx / DC Orders ED Discharge Orders     None         Isla Pence, MD 06/13/21 1550

## 2021-06-13 NOTE — ED Triage Notes (Signed)
Pt reports he passed out this morning; he has had multiple episodes like this since 2014 and no one can find a cause

## 2021-06-18 ENCOUNTER — Encounter: Payer: Self-pay | Admitting: Adult Health

## 2021-06-19 ENCOUNTER — Other Ambulatory Visit: Payer: Self-pay | Admitting: Adult Health

## 2021-06-20 ENCOUNTER — Other Ambulatory Visit: Payer: Self-pay | Admitting: Adult Health

## 2021-06-20 ENCOUNTER — Encounter: Payer: Self-pay | Admitting: Adult Health

## 2021-06-20 MED ORDER — ZALEPLON 5 MG PO CAPS: 5.0000 mg | ORAL_CAPSULE | Freq: Every evening | ORAL | 0 refills | Status: DC | PRN

## 2021-06-25 ENCOUNTER — Ambulatory Visit (INDEPENDENT_AMBULATORY_CARE_PROVIDER_SITE_OTHER): Payer: 59 | Admitting: Internal Medicine

## 2021-06-25 ENCOUNTER — Other Ambulatory Visit: Payer: Self-pay | Admitting: Adult Health

## 2021-06-25 ENCOUNTER — Encounter: Payer: Self-pay | Admitting: Internal Medicine

## 2021-06-25 VITALS — BP 110/80 | HR 74 | Ht 70.0 in | Wt 193.0 lb

## 2021-06-25 DIAGNOSIS — G43011 Migraine without aura, intractable, with status migrainosus: Secondary | ICD-10-CM

## 2021-06-25 DIAGNOSIS — R55 Syncope and collapse: Secondary | ICD-10-CM | POA: Diagnosis not present

## 2021-06-25 NOTE — Progress Notes (Signed)
Cardiology Office Note:    Date:  06/25/2021   ID:  Joseph Barry, DOB 08/26/1980, MRN [id number]  PCP:  Shirline Frees, NP   [facility] Providers Cardiologist:  None     Referring MD: Shirline Frees, NP   No chief complaint on file. Syncope  History of Present Illness:    Joseph Barry is a 41 y.o. male with a hx of HTN, OSA on CPAP, DM2, HLD referral for presyncope  He presented to the ED 6/7, with a syncopal event. He was walking his dog for 5 minutes, staggered and then passed out. He felt hot and got tunnel vision. He was taking trazodone the night prior.  He noted syncopal evens for several years. In September of 2022 he had a 2 day monitor ordered by neurology. It showed no significant brady or tachy arrhythmias. He had a normal EEG.  No further testing or treatment was recommended in March 2023.   He notes syncope 1-2 x per year since 2014. He notes he can gets warm, and he gets vision changes and then he losses consciousness. Notes he drinks up to 4 etoh drinks some nights  No chest pain or SOB. No LE edema   Past Medical History:  Diagnosis Date   Depression    Diabetes mellitus type II, uncontrolled    Eczema    Finger laceration involving tendon 05/20/2014   right index, long, ring, small fingers - tendon/artery/nerve lac.   GERD (gastroesophageal reflux disease)    History of cardiac murmur as a child    History of MRSA infection ~ 2007   back   Hypertension    states under control with med., has been on med. x 1 yr.   Migraines    Sleep apnea    uses CPAP nightly    Past Surgical History:  Procedure Laterality Date   NERVE, TENDON AND ARTERY REPAIR Right 05/24/2014   Procedure: RIGHT INDEX LONG RING SMALL/REPAIR TENDON, ARTERY NERVE;  Surgeon: Betha Loa, MD;  Location: [facility];  Service: Orthopedics;  Laterality: Right;   ORBITAL FRACTURE SURGERY Left 2003    Current Medications: Current Meds  Medication Sig   Continuous  Blood Gluc Receiver (FREESTYLE LIBRE 14 DAY READER) DEVI USE ONE DEVICE EVERY 14 DAYS TO MONITOR BLOOD GLUCOSE   Continuous Blood Gluc Sensor (FREESTYLE LIBRE 3 SENSOR) MISC PLACE 1 SENSOR ON THE SKIN AND  CHANGE EVERY 14 DAYS . USE TO  CHECK BLOOD GLUCOSE CONTINUOUSLY   JARDIANCE 25 MG TABS tablet Take 25 mg by mouth daily.   Lancets (ONETOUCH DELICA PLUS LANCET30G) MISC USE 3 TIMES DAILY   metFORMIN (GLUCOPHAGE) 500 MG tablet Take 500 mg by mouth as needed.   ondansetron (ZOFRAN ODT) 4 MG disintegrating tablet Take 1 tablet (4 mg total) by mouth every 8 (eight) hours as needed for nausea or vomiting.   ONETOUCH VERIO test strip USE TO TEST BLOOD GLUCOSE 3 TIMES DAILY   simvastatin (ZOCOR) 20 MG tablet TAKE 1 TABLET BY MOUTH AT  BEDTIME   tirzepatide (MOUNJARO) 5 MG/0.5ML Pen Inject 5 mg into the skin once a week.   zaleplon (SONATA) 5 MG capsule Take 1 capsule (5 mg total) by mouth at bedtime as needed for sleep.     Allergies:   Shellfish allergy, Tape, and Trazodone and nefazodone   Social History   Socioeconomic History   Marital status: Married    Spouse name: vanessa   Number of children: Not on file  Years of education: Not on file   Highest education level: Associate degree: occupational, Scientist, product/process development, or vocational program  Occupational History   Not on file  Tobacco Use   Smoking status: Every Day    Types: Cigars   Smokeless tobacco: Never   Tobacco comments:    5 cigars/day  Substance and Sexual Activity   Alcohol use: Yes    Alcohol/week: 0.0 standard drinks of alcohol    Comment: occasionally   Drug use: No   Sexual activity: Not on file  Other Topics Concern   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    Married for five years   Has step daughter.    Has rabbit and dog.       Lives alone   Right handed   Caffeine: 1 cup of coffee every month   Social Determinants of Health   Financial Resource  Strain: Not on file  Food Insecurity: Not on file  Transportation Needs: Not on file  Physical Activity: Not on file  Stress: Not on file  Social Connections: Not on file     Family History: The patient's family history includes Arthritis in his maternal grandmother; COPD in his mother; Cancer in his paternal grandmother; Colon polyps in his father; Diabetes in his father and maternal grandmother; Heart disease in his maternal grandmother and mother; Hypertension in his father, mother, and sister. Mother has "leaky valve".  Father has ?coronary dx.  ROS:   Please see the history of present illness.     All other systems reviewed and are negative.  EKGs/Labs/Other Studies Reviewed:    The following studies were reviewed today:   EKG:  EKG is  ordered today.  The ekg ordered today demonstrates   06/25/2021-NSR, prolonged QTc  Recent Labs: 05/08/2021: TSH 0.78 06/13/2021: ALT 13; BUN 12; Creatinine, Ser 1.07; Hemoglobin 14.3; Platelets 409; Potassium 3.7; Sodium 138   Recent Lipid Panel    Component Value Date/Time   CHOL 167 05/08/2021 1534   TRIG 80.0 05/08/2021 1534   HDL 48.60 05/08/2021 1534   CHOLHDL 3 05/08/2021 1534   VLDL 16.0 05/08/2021 1534   LDLCALC 103 (H) 05/08/2021 1534     Risk Assessment/Calculations:           Physical Exam:    VS:   Vitals:   06/25/21 1401  BP: 110/80  Pulse: 74  SpO2: 98%     Wt Readings from Last 3 Encounters:  06/25/21 193 lb (87.5 kg)  06/13/21 190 lb (86.2 kg)  06/12/21 196 lb (88.9 kg)     GEN:  Well nourished, well developed in no acute distress HEENT: Normal NECK: No JVD; No carotid bruits LYMPHATICS: No lymphadenopathy CARDIAC: RRR, no murmurs, rubs, gallops RESPIRATORY:  Clear to auscultation without rales, wheezing or rhonchi  ABDOMEN: Soft, non-tender, non-distended MUSCULOSKELETAL:  No edema; No deformity  SKIN: Warm and dry NEUROLOGIC:  Alert and oriented x 3 PSYCHIATRIC:  Normal affect   ASSESSMENT:     Vasovagal Syncope:  QTc 483 , no too prolonged. He describes typical features including tunnel vision and feeling warm prior to his syncopal episodes.  This is more consistent with vasovagal syncope. They are infrequent. Zio did not show any significant brady or tachy arrhythmia. He is low risk for CVD.  No significant murmur to suggest HOCM or valve dx. We discussed increasing hydration with electrolytes, identifying triggers, and cutting back on etoh.  PLAN:    In order of problems listed above:    1. Follow up 6 months     Medication Adjustments/Labs and Tests Ordered: Current medicines are reviewed at length with the patient today.  Concerns regarding medicines are outlined above.  Orders Placed This Encounter  Procedures   EKG 12-Lead   No orders of the defined types were placed in this encounter.   Patient Instructions  Medication Instructions:  Your physician recommends that you continue on your current medications as directed. Please refer to the Current Medication list given to you today.  *If you need a refill on your cardiac medications before your next appointment, please call your pharmacy*  Follow-Up: At [facility], you and your health needs are our priority.  As part of our continuing mission to provide you with exceptional heart care, we have created designated Provider Care Teams.  These Care Teams include your primary Cardiologist (physician) and Advanced Practice Providers (APPs -  Physician Assistants and Nurse Practitioners) who all work together to provide you with the care you need, when you need it.  We recommend signing up for the patient portal called "MyChart".  Sign up information is provided on this After Visit Summary.  MyChart is used to connect with patients for Virtual Visits (Telemedicine).  Patients are able to view lab/test results, encounter notes, upcoming appointments, etc.  Non-urgent messages can be sent to your provider as well.   To learn  more about what you can do with MyChart, go to [email or web address].    Your next appointment:   6 month(s)  The format for your next appointment:   In Person  Provider:   Dr. Wyline Mood   Important Information About Sugar         Signed, Maisie Fus, MD  06/25/2021 2:26 PM    [facility]

## 2021-06-25 NOTE — Patient Instructions (Signed)
Medication Instructions:  ?Your physician recommends that you continue on your current medications as directed. Please refer to the Current Medication list given to you today. ? ?*If you need a refill on your cardiac medications before your next appointment, please call your pharmacy* ? ?Follow-Up: ?At [facility], you and your health needs are our priority.  As part of our continuing mission to provide you with exceptional heart care, we have created designated Provider Care Teams.  These Care Teams include your primary Cardiologist (physician) and Advanced Practice Providers (APPs -  Physician Assistants and Nurse Practitioners) who all work together to provide you with the care you need, when you need it. ? ?We recommend signing up for the patient portal called "MyChart".  Sign up information is provided on this After Visit Summary.  MyChart is used to connect with patients for Virtual Visits (Telemedicine).  Patients are able to view lab/test results, encounter notes, upcoming appointments, etc.  Non-urgent messages can be sent to your provider as well.   ?To learn more about what you can do with MyChart, go to [email or web address].   ? ?Your next appointment:   ?6 month(s) ? ?The format for your next appointment:   ?In Person ? ?Provider:   ?Dr. Branch ? ?Important Information About Sugar ? ? ? ? ? ? ?

## 2021-06-27 ENCOUNTER — Other Ambulatory Visit: Payer: Self-pay | Admitting: Adult Health

## 2021-06-27 DIAGNOSIS — E119 Type 2 diabetes mellitus without complications: Secondary | ICD-10-CM

## 2021-07-02 ENCOUNTER — Other Ambulatory Visit: Payer: Self-pay | Admitting: Adult Health

## 2021-07-05 ENCOUNTER — Other Ambulatory Visit: Payer: Self-pay | Admitting: Adult Health

## 2021-07-05 DIAGNOSIS — F5101 Primary insomnia: Secondary | ICD-10-CM

## 2021-07-18 ENCOUNTER — Other Ambulatory Visit: Payer: Self-pay | Admitting: Adult Health

## 2021-08-10 ENCOUNTER — Other Ambulatory Visit: Payer: Self-pay

## 2021-08-10 MED ORDER — ZALEPLON 5 MG PO CAPS
5.0000 mg | ORAL_CAPSULE | Freq: Every evening | ORAL | 2 refills | Status: DC | PRN
Start: 2021-08-10 — End: 2021-12-14

## 2021-08-15 ENCOUNTER — Other Ambulatory Visit: Payer: Self-pay | Admitting: Adult Health

## 2021-09-15 ENCOUNTER — Other Ambulatory Visit: Payer: Self-pay | Admitting: Adult Health

## 2021-09-15 DIAGNOSIS — E1165 Type 2 diabetes mellitus with hyperglycemia: Secondary | ICD-10-CM

## 2021-11-03 ENCOUNTER — Other Ambulatory Visit: Payer: Self-pay | Admitting: Adult Health

## 2021-11-03 DIAGNOSIS — E119 Type 2 diabetes mellitus without complications: Secondary | ICD-10-CM

## 2021-11-08 ENCOUNTER — Encounter: Payer: Self-pay | Admitting: Adult Health

## 2021-11-08 ENCOUNTER — Ambulatory Visit (INDEPENDENT_AMBULATORY_CARE_PROVIDER_SITE_OTHER): Payer: 59 | Admitting: Adult Health

## 2021-11-08 VITALS — BP 100/88 | HR 76 | Temp 98.4°F | Ht 70.0 in | Wt 193.0 lb

## 2021-11-08 DIAGNOSIS — F329 Major depressive disorder, single episode, unspecified: Secondary | ICD-10-CM | POA: Diagnosis not present

## 2021-11-08 DIAGNOSIS — Z832 Family history of diseases of the blood and blood-forming organs and certain disorders involving the immune mechanism: Secondary | ICD-10-CM

## 2021-11-08 DIAGNOSIS — E119 Type 2 diabetes mellitus without complications: Secondary | ICD-10-CM

## 2021-11-08 DIAGNOSIS — A630 Anogenital (venereal) warts: Secondary | ICD-10-CM

## 2021-11-08 DIAGNOSIS — F17200 Nicotine dependence, unspecified, uncomplicated: Secondary | ICD-10-CM

## 2021-11-08 LAB — POCT GLYCOSYLATED HEMOGLOBIN (HGB A1C): Hemoglobin A1C: 5.5 % (ref 4.0–5.6)

## 2021-11-08 MED ORDER — BUPROPION HCL ER (XL) 150 MG PO TB24: 150.0000 mg | ORAL_TABLET | Freq: Every day | ORAL | 0 refills | Status: DC

## 2021-11-08 NOTE — Patient Instructions (Signed)
I am going to sent in Wellbutrin 150 mg XR daily   Follow up in one month

## 2021-11-08 NOTE — Progress Notes (Addendum)
Subjective:    Patient ID: Joseph Barry, male    DOB: [birth year], 41 y.o.   MRN: [id number]  HPI  41 year old male who  has a past medical history of Depression, Diabetes mellitus type II, uncontrolled, Eczema, Finger laceration involving tendon (05/20/2014), GERD (gastroesophageal reflux disease), History of cardiac murmur as a child, History of MRSA infection (~ 2007), Hypertension, Migraines, and Sleep apnea.  He presents to the office today for six month follow up regarding DM. He is currently managed with Mounjaro 5 mg weekly. His blood sugars have been well controlled and he has lost about 67 pounds total.  He does report that over the last few months he has had multiple hypoglycemic episodes which his blood sugars dropped into the 60s.  He did try going a month without taking medication to see what would happen and reports that his blood sugars stayed pretty stable in the 110s to 120s.  He is still eating healthy and is exercising to some degree but has cut back on his gym time.  The more he would like to discuss going back on some type of depression medication.  He feels as though he has lost pleasure in doing things on most days and does feel depressed.  He denies suicidal ideation but feels as though his depression is bad enough that he wants to restart medication.  In the past he was on trazodone but this caused priapism.  Additionally, he would like help with quitting smoking, he reports playing basketball a few days ago and noticed that it was much harder for him to participate in cardio exercise without feeling extremely short of breath.  Furthermore he has some genital warts that at home has been using over-the-counter wart removal but feels as though this made his genital warts worse.  He is wondering what we can do to help get rid of his genital warts.  Finally, he would like to be checked for autoimmune diseases, he reports a family history of autoimmune disease.   Review of  Systems See HPI   Past Medical History:  Diagnosis Date   Depression    Diabetes mellitus type II, uncontrolled    Eczema    Finger laceration involving tendon 05/20/2014   right index, long, ring, small fingers - tendon/artery/nerve lac.   GERD (gastroesophageal reflux disease)    History of cardiac murmur as a child    History of MRSA infection ~ 2007   back   Hypertension    states under control with med., has been on med. x 1 yr.   Migraines    Sleep apnea    uses CPAP nightly    Social History   Socioeconomic History   Marital status: Married    Spouse name: vanessa   Number of children: Not on file   Years of education: Not on file   Highest education level: Associate degree: occupational, Scientist, product/process development, or vocational program  Occupational History   Not on file  Tobacco Use   Smoking status: Every Day    Types: Cigars   Smokeless tobacco: Never   Tobacco comments:    5 cigars/day  Substance and Sexual Activity   Alcohol use: Yes    Alcohol/week: 0.0 standard drinks of alcohol    Comment: occasionally   Drug use: No   Sexual activity: Not on file  Other Topics Concern   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  Married for five years   Has step daughter.    Has rabbit and dog.       Lives alone   Right handed   Caffeine: 1 cup of coffee every month   Social Determinants of Health   Financial Resource Strain: Low Risk  (11/07/2021)   Overall Financial Resource Strain (CARDIA)    Difficulty of Paying Living Expenses: Not very hard  Food Insecurity: No Food Insecurity (11/07/2021)   Hunger Vital Sign    Worried About Running Out of Food in the Last Year: Never true    Ran Out of Food in the Last Year: Never true  Transportation Needs: No Transportation Needs (11/07/2021)   PRAPARE - Administrator, Civil Service (Medical): No    Lack of Transportation (Non-Medical): No  Physical  Activity: Insufficiently Active (11/07/2021)   Exercise Vital Sign    Days of Exercise per Week: 1 day    Minutes of Exercise per Session: 60 min  Stress: Stress Concern Present (11/07/2021)   [employer] of Occupational Health - Occupational Stress Questionnaire    Feeling of Stress : Very much  Social Connections: Socially Isolated (11/07/2021)   Social Connection and Isolation Panel [NHANES]    Frequency of Communication with Friends and Family: Three times a week    Frequency of Social Gatherings with Friends and Family: Twice a week    Attends Religious Services: Never    Database administrator or Organizations: No    Attends Engineer, structural: Not on file    Marital Status: Separated  Intimate Partner Violence: Not on file    Past Surgical History:  Procedure Laterality Date   NERVE, TENDON AND ARTERY REPAIR Right 05/24/2014   Procedure: RIGHT INDEX LONG RING SMALL/REPAIR TENDON, ARTERY NERVE;  Surgeon: Betha Loa, MD;  Location: [facility];  Service: Orthopedics;  Laterality: Right;   ORBITAL FRACTURE SURGERY Left 2003    Family History  Problem Relation Age of Onset   Arthritis Maternal Grandmother    Diabetes Maternal Grandmother    Heart disease Maternal Grandmother    Hypertension Father    Diabetes Father    Colon polyps Father    Hypertension Mother    Heart disease Mother    COPD Mother    Cancer Paternal Grandmother        spine cancer   Hypertension Sister     Allergies  Allergen Reactions   Shellfish Allergy Shortness Of Breath and Swelling   Tape     rash   Trazodone And Nefazodone     Priapism     Current Outpatient Medications on File Prior to Visit  Medication Sig Dispense Refill   Continuous Blood Gluc Receiver (FREESTYLE LIBRE 14 DAY READER) DEVI USE ONE DEVICE EVERY 14 DAYS TO MONITOR BLOOD GLUCOSE 6 each 2   Continuous Blood Gluc Sensor (FREESTYLE LIBRE 3 SENSOR) MISC PLACE 1 SENSOR ON THE SKIN EVERY 14 DAYS.  USE TO CHECK GLUCOSE CONTINOUSLY 2 each 0   Lancets (ONETOUCH DELICA PLUS LANCET30G) MISC USE 3 TIMES DAILY 300 each 3   ondansetron (ZOFRAN ODT) 4 MG disintegrating tablet Take 1 tablet (4 mg total) by mouth every 8 (eight) hours as needed for nausea or vomiting. 20 tablet 0   ONETOUCH VERIO test strip USE TO TEST BLOOD GLUCOSE 3  TIMES DAILY 150 strip 6   simvastatin (ZOCOR) 20 MG tablet TAKE 1 TABLET BY MOUTH AT  BEDTIME 90 tablet 3  tirzepatide [facility]) 5 MG/0.5ML Pen Inject 5 mg into the skin once a week. 6 mL 1   zaleplon (SONATA) 5 MG capsule Take 1 capsule (5 mg total) by mouth at bedtime as needed for sleep. 30 capsule 2   No current facility-administered medications on file prior to visit.    BP 100/88   Pulse 76   Temp 98.4 F (36.9 C) (Oral)   Ht 5\' 10"  (1.778 m)   Wt 193 lb (87.5 kg)   SpO2 99%   BMI 27.69 kg/m       Objective:   Physical Exam Vitals and nursing note reviewed.  Constitutional:      Appearance: Normal appearance.  Cardiovascular:     Rate and Rhythm: Normal rate and regular rhythm.     Pulses: Normal pulses.     Heart sounds: Normal heart sounds.  Pulmonary:     Effort: Pulmonary effort is normal.     Breath sounds: Normal breath sounds.  Abdominal:     General: Abdomen is flat. Bowel sounds are normal.     Palpations: Abdomen is soft.  Genitourinary:   Skin:    General: Skin is warm and dry.     Capillary Refill: Capillary refill takes less than 2 seconds.  Neurological:     General: No focal deficit present.     Mental Status: He is alert and oriented to person, place, and time.  Psychiatric:        Mood and Affect: Mood normal.        Behavior: Behavior normal.       Assessment & Plan:  1. Diabetes mellitus without complication ([place])  - POC HgB A1c- 5.5 - no change  - Will have him stop Mounjaro for the next three months .  Think he has lost enough weight that this can likely be managed through lifestyle modifications.  He  was encouraged to restart if his blood sugars raise above 150 consistently.  He will follow-up in 3 months  2. Family history of autoimmune disorder  - ANA 3. Reactive depression Flowsheet Row Office Visit from 11/08/2021 in [facility] [place]  PHQ-9 Total Score 15       - Will start on wellbutrin 150 mg XR and have him follow up in 30 days  - buPROPion (WELLBUTRIN XL) 150 MG 24 hr tablet; Take 1 tablet (150 mg total) by mouth daily.  Dispense: 90 tablet; Refill: 0  4. Genital warts Verbal consent obtained.  Using cryotherapy, five warts were frozen using 2 freeze thaw cycles.  He tolerated the procedure well.  Advised of aftercare instructions.  5. Tobacco use disorder  - buPROPion (WELLBUTRIN XL) 150 MG 24 hr tablet; Take 1 tablet (150 mg total) by mouth daily.  Dispense: 90 tablet; Refill: 0  Dorothyann Peng, NP

## 2021-11-11 LAB — ANA: Anti Nuclear Antibody (ANA): NEGATIVE

## 2021-12-07 ENCOUNTER — Encounter: Payer: Self-pay | Admitting: Adult Health

## 2021-12-07 ENCOUNTER — Telehealth (INDEPENDENT_AMBULATORY_CARE_PROVIDER_SITE_OTHER): Payer: 59 | Admitting: Adult Health

## 2021-12-07 VITALS — HR 70 | Ht 70.0 in | Wt 195.0 lb

## 2021-12-07 DIAGNOSIS — F329 Major depressive disorder, single episode, unspecified: Secondary | ICD-10-CM

## 2021-12-07 DIAGNOSIS — F5101 Primary insomnia: Secondary | ICD-10-CM

## 2021-12-07 NOTE — Progress Notes (Signed)
Virtual Visit via Video Note  I connected with Pryor Montes  on 12/07/21 at  3:00 PM EST by a video enabled telemedicine application and verified that I am speaking with the correct person using two identifiers.  Location patient: home Location provider:work or home office Persons participating in the virtual visit: patient, provider  I discussed the limitations of evaluation and management by telemedicine and the availability of in person appointments. The patient expressed understanding and agreed to proceed.   HPI:  The 41-year-old male who is being evaluated today for 1 month follow-up regarding depression.  During the last visit he felt as though his depression had increased and he was interested in restarting medication.  On the past he was taking trazodone but just because priapism.  He was placed on Wellbutrin 150 mg.  He does report that he is feeling better but is not exactly at the level that he would like.  He feels a lot of the depression stems from work stress.  He is hoping that after the first of the year the work stress, calms  down for a while.  He is also having trouble sleeping.  He does take Sonata 5 mg as needed, not taking every night.  When he does take the medication he does sleep better but not throughout the night and it takes a while for him to fall asleep.  He is interested in increasing the medication but does not want to feel drowsy in the morning.  ROS: See pertinent positives and negatives per HPI.  Past Medical History:  Diagnosis Date   Depression    Diabetes mellitus type II, uncontrolled    Eczema    Finger laceration involving tendon 05/20/2014   right index, long, ring, small fingers - tendon/artery/nerve lac.   GERD (gastroesophageal reflux disease)    History of cardiac murmur as a child    History of MRSA infection ~ 2007   back   Hypertension    states under control with med., has been on med. x 1 yr.   Migraines    Sleep apnea    uses CPAP  nightly    Past Surgical History:  Procedure Laterality Date   NERVE, TENDON AND ARTERY REPAIR Right 05/24/2014   Procedure: RIGHT INDEX LONG RING SMALL/REPAIR TENDON, ARTERY NERVE;  Surgeon: Betha Loa, MD;  Location: [facility];  Service: Orthopedics;  Laterality: Right;   ORBITAL FRACTURE SURGERY Left 2003    Family History  Problem Relation Age of Onset   Arthritis Maternal Grandmother    Diabetes Maternal Grandmother    Heart disease Maternal Grandmother    Hypertension Father    Diabetes Father    Colon polyps Father    Hypertension Mother    Heart disease Mother    COPD Mother    Cancer Paternal Grandmother        spine cancer   Hypertension Sister        Current Outpatient Medications:    buPROPion (WELLBUTRIN XL) 150 MG 24 hr tablet, Take 1 tablet (150 mg total) by mouth daily., Disp: 90 tablet, Rfl: 0   Continuous Blood Gluc Receiver (FREESTYLE LIBRE 14 DAY READER) DEVI, USE ONE DEVICE EVERY 14 DAYS TO MONITOR BLOOD GLUCOSE, Disp: 6 each, Rfl: 2   Continuous Blood Gluc Sensor (FREESTYLE LIBRE 3 SENSOR) MISC, PLACE 1 SENSOR ON THE SKIN EVERY 14 DAYS. USE TO CHECK GLUCOSE CONTINOUSLY, Disp: 2 each, Rfl: 0   Lancets (ONETOUCH DELICA PLUS LANCET30G) MISC,  USE 3 TIMES DAILY, Disp: 300 each, Rfl: 3   ondansetron (ZOFRAN ODT) 4 MG disintegrating tablet, Take 1 tablet (4 mg total) by mouth every 8 (eight) hours as needed for nausea or vomiting., Disp: 20 tablet, Rfl: 0   ONETOUCH VERIO test strip, USE TO TEST BLOOD GLUCOSE 3  TIMES DAILY, Disp: 150 strip, Rfl: 6   simvastatin (ZOCOR) 20 MG tablet, TAKE 1 TABLET BY MOUTH AT  BEDTIME, Disp: 90 tablet, Rfl: 3   tirzepatide (MOUNJARO) 5 MG/0.5ML Pen, Inject 5 mg into the skin once a week., Disp: 6 mL, Rfl: 1   zaleplon (SONATA) 5 MG capsule, Take 1 capsule (5 mg total) by mouth at bedtime as needed for sleep., Disp: 30 capsule, Rfl: 2  EXAM:  VITALS per patient if applicable:  GENERAL: alert, oriented, appears  well and in no acute distress  HEENT: atraumatic, conjunttiva clear, no obvious abnormalities on inspection of external nose and ears  NECK: normal movements of the head and neck  LUNGS: on inspection no signs of respiratory distress, breathing rate appears normal, no obvious gross SOB, gasping or wheezing  CV: no obvious cyanosis  MS: moves all visible extremities without noticeable abnormality  PSYCH/NEURO: pleasant and cooperative, no obvious depression or anxiety, speech and thought processing grossly intact  ASSESSMENT AND PLAN:  Discussed the following assessment and plan:  1. Reactive depression -We will keep him on Wellbutrin 150 mg daily until after the first of the year and then we will reevaluate.  2. Primary insomnia -Have him increase his dose to 10 mg over the weekend to see if this makes any difference.  He will send me a MyChart message early next week to let me know how he did on the increased dose of Sonata      I discussed the assessment and treatment plan with the patient. The patient was provided an opportunity to ask questions and all were answered. The patient agreed with the plan and demonstrated an understanding of the instructions.   The patient was advised to call back or seek an in-person evaluation if the symptoms worsen or if the condition fails to improve as anticipated.   Dorothyann Peng, NP

## 2021-12-13 ENCOUNTER — Encounter: Payer: Self-pay | Admitting: Adult Health

## 2021-12-14 ENCOUNTER — Other Ambulatory Visit: Payer: Self-pay | Admitting: Adult Health

## 2021-12-14 MED ORDER — ZALEPLON 10 MG PO CAPS: 10.0000 mg | ORAL_CAPSULE | Freq: Every evening | ORAL | 2 refills | Status: DC | PRN

## 2021-12-15 ENCOUNTER — Other Ambulatory Visit: Payer: Self-pay | Admitting: Adult Health

## 2021-12-25 ENCOUNTER — Ambulatory Visit: Payer: 59 | Attending: Internal Medicine | Admitting: Internal Medicine

## 2021-12-25 NOTE — Progress Notes (Deleted)
Cardiology Office Note:    Date:  12/25/2021   ID:  Joseph Barry, DOB [birth year], MRN [id number]  PCP:  Shirline Frees, NP   [facility] Providers Cardiologist:  None     Referring MD: Shirline Frees, NP   No chief complaint on file. Syncope  History of Present Illness:    Joseph Barry is a 41 y.o. male with a hx of HTN, OSA on CPAP, DM2, HLD referral for presyncope  He presented to the ED 6/7, with a syncopal event. He was walking his dog for 5 minutes, staggered and then passed out. He felt hot and got tunnel vision. He was taking trazodone the night prior.  He noted syncopal evens for several years. In September of 2022 he had a 2 day monitor ordered by neurology. It showed no significant brady or tachy arrhythmias. He had a normal EEG.  No further testing or treatment was recommended in March 2023.   He notes syncope 1-2 x per year since 2014. He notes he can gets warm, and he gets vision changes and then he losses consciousness. Notes he drinks up to 4 etoh drinks some nights  No chest pain or SOB. No LE edema  Interim hx 12/25/2021 No issues today ***   Past Medical History:  Diagnosis Date   Depression    Diabetes mellitus type II, uncontrolled    Eczema    Finger laceration involving tendon 05/20/2014   right index, long, ring, small fingers - tendon/artery/nerve lac.   GERD (gastroesophageal reflux disease)    History of cardiac murmur as a child    History of MRSA infection ~ 2007   back   Hypertension    states under control with med., has been on med. x 1 yr.   Migraines    Sleep apnea    uses CPAP nightly    Past Surgical History:  Procedure Laterality Date   NERVE, TENDON AND ARTERY REPAIR Right 05/24/2014   Procedure: RIGHT INDEX LONG RING SMALL/REPAIR TENDON, ARTERY NERVE;  Surgeon: Betha Loa, MD;  Location: [facility];  Service: Orthopedics;  Laterality: Right;   ORBITAL FRACTURE SURGERY Left 2003    Current  Medications: No outpatient medications have been marked as taking for the 12/25/21 encounter (Appointment) with Maisie Fus, MD.     Allergies:   Shellfish allergy, Tape, and Trazodone and nefazodone   Social History   Socioeconomic History   Marital status: Married    Spouse name: vanessa   Number of children: Not on file   Years of education: Not on file   Highest education level: Associate degree: occupational, Scientist, product/process development, or vocational program  Occupational History   Not on file  Tobacco Use   Smoking status: Every Day    Types: Cigars   Smokeless tobacco: Never   Tobacco comments:    5 cigars/day  Substance and Sexual Activity   Alcohol use: Yes    Alcohol/week: 0.0 standard drinks of alcohol    Comment: occasionally   Drug use: No   Sexual activity: Not on file  Other Topics Concern   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    Married for five years   Has step daughter.    Has rabbit and dog.       Lives alone   Right handed   Caffeine: 1 cup of coffee every month   Social Determinants of Health  Financial Resource Strain: Low Risk  (11/07/2021)   Overall Financial Resource Strain (CARDIA)    Difficulty of Paying Living Expenses: Not very hard  Food Insecurity: No Food Insecurity (11/07/2021)   Hunger Vital Sign    Worried About Running Out of Food in the Last Year: Never true    Ran Out of Food in the Last Year: Never true  Transportation Needs: No Transportation Needs (11/07/2021)   PRAPARE - Administrator, Civil Service (Medical): No    Lack of Transportation (Non-Medical): No  Physical Activity: Insufficiently Active (11/07/2021)   Exercise Vital Sign    Days of Exercise per Week: 1 day    Minutes of Exercise per Session: 60 min  Stress: Stress Concern Present (11/07/2021)   [employer] of Occupational Health - Occupational Stress Questionnaire    Feeling of Stress : Very  much  Social Connections: Socially Isolated (11/07/2021)   Social Connection and Isolation Panel [NHANES]    Frequency of Communication with Friends and Family: Three times a week    Frequency of Social Gatherings with Friends and Family: Twice a week    Attends Religious Services: Never    Database administrator or Organizations: No    Attends Engineer, structural: Not on file    Marital Status: Separated     Family History: The patient's family history includes Arthritis in his maternal grandmother; COPD in his mother; Cancer in his paternal grandmother; Colon polyps in his father; Diabetes in his father and maternal grandmother; Heart disease in his maternal grandmother and mother; Hypertension in his father, mother, and sister. Mother has "leaky valve".  Father has ?coronary dx.  ROS:   Please see the history of present illness.     All other systems reviewed and are negative.  EKGs/Labs/Other Studies Reviewed:    The following studies were reviewed today:   EKG:  EKG is  ordered today.  The ekg ordered today demonstrates   06/25/2021-NSR, prolonged QTc  Recent Labs: 05/08/2021: TSH 0.78 06/13/2021: ALT 13; BUN 12; Creatinine, Ser 1.07; Hemoglobin 14.3; Platelets 409; Potassium 3.7; Sodium 138   Recent Lipid Panel    Component Value Date/Time   CHOL 167 05/08/2021 1534   TRIG 80.0 05/08/2021 1534   HDL 48.60 05/08/2021 1534   CHOLHDL 3 05/08/2021 1534   VLDL 16.0 05/08/2021 1534   LDLCALC 103 (H) 05/08/2021 1534     Risk Assessment/Calculations:           Physical Exam:    VS:   There were no vitals filed for this visit.    Wt Readings from Last 3 Encounters:  12/07/21 195 lb (88.5 kg)  11/08/21 193 lb (87.5 kg)  06/25/21 193 lb (87.5 kg)     GEN:  Well nourished, well developed in no acute distress HEENT: Normal NECK: No JVD; No carotid bruits LYMPHATICS: No lymphadenopathy CARDIAC: RRR, no murmurs, rubs, gallops RESPIRATORY:  Clear to  auscultation without rales, wheezing or rhonchi  ABDOMEN: Soft, non-tender, non-distended MUSCULOSKELETAL:  No edema; No deformity  SKIN: Warm and dry NEUROLOGIC:  Alert and oriented x 3 PSYCHIATRIC:  Normal affect   ASSESSMENT:    Vasovagal Syncope:  QTc 483 , no too prolonged. He describes typical features including tunnel vision and feeling warm prior to his syncopal episodes.  This is more consistent with vasovagal syncope. They are infrequent. Zio did not show any significant brady or tachy arrhythmia. He is low risk for CVD.  No  significant murmur to suggest HOCM or valve dx. We discussed increasing hydration with electrolytes, identifying triggers, and cutting back on etoh.  PLAN:    In order of problems listed above:    1. Follow up PRN ***     Medication Adjustments/Labs and Tests Ordered: Current medicines are reviewed at length with the patient today.  Concerns regarding medicines are outlined above.  No orders of the defined types were placed in this encounter.  No orders of the defined types were placed in this encounter.   There are no Patient Instructions on file for this visit.   Signed, Maisie Fus, MD  12/25/2021 2:35 PM    [facility]

## 2022-01-03 ENCOUNTER — Other Ambulatory Visit: Payer: Self-pay | Admitting: Adult Health

## 2022-01-03 ENCOUNTER — Encounter: Payer: Self-pay | Admitting: Internal Medicine

## 2022-01-03 DIAGNOSIS — F17200 Nicotine dependence, unspecified, uncomplicated: Secondary | ICD-10-CM

## 2022-02-02 ENCOUNTER — Other Ambulatory Visit: Payer: Self-pay

## 2022-02-02 ENCOUNTER — Encounter (HOSPITAL_BASED_OUTPATIENT_CLINIC_OR_DEPARTMENT_OTHER): Payer: Self-pay

## 2022-02-02 ENCOUNTER — Emergency Department (HOSPITAL_BASED_OUTPATIENT_CLINIC_OR_DEPARTMENT_OTHER)
Admission: EM | Admit: 2022-02-02 | Discharge: 2022-02-03 | Disposition: A | Discharge status: Home / Self Care | Payer: 59 | Attending: Emergency Medicine | Admitting: Emergency Medicine

## 2022-02-02 DIAGNOSIS — N4830 Priapism, unspecified: Secondary | ICD-10-CM

## 2022-02-02 DIAGNOSIS — Z79899 Other long term (current) drug therapy: Secondary | ICD-10-CM | POA: Diagnosis not present

## 2022-02-02 DIAGNOSIS — E119 Type 2 diabetes mellitus without complications: Secondary | ICD-10-CM | POA: Insufficient documentation

## 2022-02-02 DIAGNOSIS — F17210 Nicotine dependence, cigarettes, uncomplicated: Secondary | ICD-10-CM | POA: Diagnosis not present

## 2022-02-02 DIAGNOSIS — I10 Essential (primary) hypertension: Secondary | ICD-10-CM | POA: Insufficient documentation

## 2022-02-02 LAB — BASIC METABOLIC PANEL
Anion gap: 7 (ref 5–15)
BUN: 13 mg/dL (ref 6–20)
CO2: 26 mmol/L (ref 22–32)
Calcium: 9 mg/dL (ref 8.9–10.3)
Chloride: 103 mmol/L (ref 98–111)
Creatinine, Ser: 1.12 mg/dL (ref 0.61–1.24)
GFR, Estimated: >60 mL/min (ref >60)
Glucose, Bld: 91 mg/dL (ref 70–99)
Potassium: 3.6 mmol/L (ref 3.5–5.1)
Sodium: 136 mmol/L (ref 135–145)

## 2022-02-02 LAB — CBC WITH DIFFERENTIAL/PLATELET
Abs Immature Granulocytes: 0.03 10*3/uL (ref 0.00–0.07)
Basophils Absolute: 0 10*3/uL (ref 0.0–0.1)
Basophils Relative: 0 %
Eosinophils Absolute: 0 10*3/uL (ref 0.0–0.5)
Eosinophils Relative: 0 %
HCT: 45.6 % (ref 39.0–52.0)
Hemoglobin: 15.8 g/dL (ref 13.0–17.0)
Immature Granulocytes: 0 %
Lymphocytes Relative: 19 %
Lymphs Abs: 1.7 10*3/uL (ref 0.7–4.0)
MCH: 31.3 pg (ref 26.0–34.0)
MCHC: 34.6 g/dL (ref 30.0–36.0)
MCV: 90.5 fL (ref 80.0–100.0)
Monocytes Absolute: 0.6 10*3/uL (ref 0.1–1.0)
Monocytes Relative: 7 %
Neutro Abs: 6.6 10*3/uL (ref 1.7–7.7)
Neutrophils Relative %: 74 %
Platelets: 342 10*3/uL (ref 150–400)
RBC: 5.04 MIL/uL (ref 4.22–5.81)
RDW: 12.5 % (ref 11.5–15.5)
WBC: 9.1 10*3/uL (ref 4.0–10.5)
nRBC: 0 % (ref 0.0–0.2)

## 2022-02-02 LAB — PROTIME-INR
INR: 1 (ref 0.8–1.2)
Prothrombin Time: 13.4 seconds (ref 11.4–15.2)

## 2022-02-02 LAB — APTT: aPTT: 30 seconds (ref 24–36)

## 2022-02-02 MED ORDER — ONDANSETRON HCL 4 MG/2ML IJ SOLN
4.0000 mg | Freq: Once | INTRAMUSCULAR | Status: AC
  Administered 2022-02-02: 4 mg via INTRAVENOUS
  Filled 2022-02-02: qty 2

## 2022-02-02 MED ORDER — LIDOCAINE HCL (PF) 1 % IJ SOLN
5.0000 mL | Freq: Once | INTRAMUSCULAR | Status: AC
  Administered 2022-02-02: 5 mL
  Filled 2022-02-02: qty 5

## 2022-02-02 MED ORDER — TERBUTALINE SULFATE 1 MG/ML IJ SOLN
0.2500 mg | Freq: Once | INTRAMUSCULAR | Status: AC
  Administered 2022-02-02: 0.25 mg via SUBCUTANEOUS
  Filled 2022-02-02: qty 1

## 2022-02-02 MED ORDER — MORPHINE SULFATE (PF) 2 MG/ML IV SOLN
2.0000 mg | Freq: Once | INTRAVENOUS | Status: AC
  Administered 2022-02-02: 2 mg via INTRAVENOUS
  Filled 2022-02-02: qty 1

## 2022-02-02 MED ORDER — PHENYLEPHRINE 200 MCG/ML FOR PRIAPISM / HYPOTENSION
200.0000 ug | INTRAMUSCULAR | Status: DC | PRN
  Administered 2022-02-03: 200 ug via INTRACAVERNOUS
  Filled 2022-02-02: qty 50

## 2022-02-02 NOTE — ED Notes (Signed)
Water given to patient 

## 2022-02-02 NOTE — ED Provider Notes (Signed)
[place] [facility] [facility] Provider Note   CSN: [id number] Arrival date & time: 02/02/22  1301     History  Chief Complaint  Patient presents with   Priapism    Joseph Barry is a 42 y.o. male.  Patient went to bed 0 500 awoke at 0 600 with erection.  That has persisted.  Patient had something similar happen in June 2023.  Needed to have a blood drained and had to be injected with phenylephrine.  At that time patient was on an antidepressive medicine that could result in a problem.  Did follow-up with urology.  He is got his medicine switch.  Currently he is just on Wellbutrin XL.  Patient denies any cocaine use.  Patient denies any ED medication.  Past medical history significant for gastroesophageal reflux disease migraines hypertension and type 2 diabetes and depression.  Patient smokes cigars every day.       Home Medications Prior to Admission medications   Medication Sig Start Date End Date Taking? Authorizing Provider  buPROPion (WELLBUTRIN XL) 150 MG 24 hr tablet TAKE 1 TABLET BY MOUTH DAILY 01/08/22   Nafziger, Tommi Rumps, NP  Continuous Blood Gluc Receiver (FREESTYLE LIBRE 14 DAY READER) DEVI USE ONE DEVICE EVERY 14 DAYS TO MONITOR BLOOD GLUCOSE 12/12/20   Nafziger, Tommi Rumps, NP  Continuous Blood Gluc Sensor (FREESTYLE LIBRE 3 SENSOR) MISC PLACE 1 SENSOR ON THE SKIN EVERY 14 DAYS. USE TO CHECK GLUCOSE CONTINOUSLY 06/27/21   Nafziger, Musella, NP  Lancets (ONETOUCH DELICA PLUS OVFIEP32R) MISC USE TO CHECK BLOOD SUGAR 3 TIMES DAILY 12/18/21   Nafziger, Tommi Rumps, NP  ondansetron (ZOFRAN ODT) 4 MG disintegrating tablet Take 1 tablet (4 mg total) by mouth every 8 (eight) hours as needed for nausea or vomiting. 11/21/20   Nafziger, Tommi Rumps, NP  ONETOUCH VERIO test strip USE TO TEST BLOOD GLUCOSE 3  TIMES DAILY 09/18/21   Nafziger, Tommi Rumps, NP  simvastatin (ZOCOR) 20 MG tablet TAKE 1 TABLET BY MOUTH AT  BEDTIME 02/13/21   Nafziger, Tommi Rumps, NP  tirzepatide [facility]) 5 MG/0.5ML  Pen Inject 5 mg into the skin once a week. 05/08/21   Nafziger, Tommi Rumps, NP  zaleplon (SONATA) 10 MG capsule Take 1 capsule (10 mg total) by mouth at bedtime as needed for sleep. 12/14/21 01/13/22  Nafziger, Tommi Rumps, NP      Allergies    Shellfish allergy, Tape, and Trazodone and nefazodone    Review of Systems   Review of Systems  Constitutional:  Negative for chills and fever.  HENT:  Negative for rhinorrhea and sore throat.   Eyes:  Negative for visual disturbance.  Respiratory:  Negative for cough and shortness of breath.   Cardiovascular:  Negative for chest pain and leg swelling.  Gastrointestinal:  Negative for abdominal pain, diarrhea, nausea and vomiting.  Genitourinary:  Positive for penile pain and penile swelling. Negative for dysuria.  Musculoskeletal:  Negative for back pain and neck pain.  Skin:  Negative for rash.  Neurological:  Negative for dizziness, light-headedness and headaches.  Hematological:  Does not bruise/bleed easily.  Psychiatric/Behavioral:  Negative for confusion.     Physical Exam Updated Vital Signs BP 139/71 (BP Location: Right Arm)   Pulse 67   Temp 98.4 F (36.9 C) (Oral)   Resp 18   Ht 1.803 m (5\' 11" )   Wt 90.7 kg   SpO2 99%   BMI 27.89 kg/m  Physical Exam Vitals and nursing note reviewed.  Constitutional:  General: He is not in acute distress.    Appearance: Normal appearance. He is well-developed.  HENT:     Head: Normocephalic and atraumatic.  Eyes:     Extraocular Movements: Extraocular movements intact.     Conjunctiva/sclera: Conjunctivae normal.     Pupils: Pupils are equal, round, and reactive to light.  Cardiovascular:     Rate and Rhythm: Normal rate and regular rhythm.     Heart sounds: No murmur heard. Pulmonary:     Effort: Pulmonary effort is normal. No respiratory distress.     Breath sounds: Normal breath sounds.  Abdominal:     Palpations: Abdomen is soft.     Tenderness: There is no abdominal tenderness.   Genitourinary:    Comments: Erection.  Glands not super tense.  Stickles normal. Musculoskeletal:        General: No swelling.     Cervical back: Neck supple.  Skin:    General: Skin is warm and dry.     Capillary Refill: Capillary refill takes less than 2 seconds.  Neurological:     General: No focal deficit present.     Mental Status: He is alert and oriented to person, place, and time.  Psychiatric:        Mood and Affect: Mood normal.     ED Results / Procedures / Treatments   Labs (all labs ordered are listed, but only abnormal results are displayed) Labs Reviewed  CBC WITH DIFFERENTIAL/PLATELET  BASIC METABOLIC PANEL  PROTIME-INR  APTT    EKG None  Radiology No results found.  Procedures Irrigate corpus cavern, priapism  Date/Time: 02/03/2022 12:31 AM  Performed by: Vanetta Mulders, MD Authorized by: Vanetta Mulders, MD  Consent: Verbal consent obtained. Risks and benefits: risks, benefits and alternatives were discussed Consent given by: patient Patient understanding: patient states understanding of the procedure being performed Patient consent: the patient's understanding of the procedure matches consent given Relevant documents: relevant documents present and verified Test results: test results available and properly labeled Required items: required blood products, implants, devices, and special equipment available Patient identity confirmed: verbally with patient Time out: Immediately prior to procedure a "time out" was called to verify the correct patient, procedure, equipment, support staff and site/side marked as required. Preparation: Patient was prepped and draped in the usual sterile fashion. Local anesthesia used: yes Anesthesia: local infiltration  Anesthesia: Local anesthesia used: yes Local Anesthetic: lidocaine 1% without epinephrine Anesthetic total: 3 mL  Sedation: Patient sedated: no  Patient tolerance: patient tolerated the  procedure well with no immediate complications Comments: Patient tolerated procedure well.  After numbing up the the skin on both sides of the corpus cavernosum.  Aspirated a total of 30 cc blood.  Aspirated from both sides.  Got excellent improvement with that.  Went ahead and injected phenylephrine concentration of 100 mics per mL.  Injected a total of 400 mcg 200 each side.  Then repeated another 200 total 5 minutes later.       Medications Ordered in ED Medications  phenylephrine 200 mcg / ml CONC. DILUTION INJ (ED / Urology USE ONLY) (has no administration in time range)  lidocaine (PF) (XYLOCAINE) 1 % injection 5 mL (has no administration in time range)  ondansetron (ZOFRAN) injection 4 mg (4 mg Intravenous Given 02/02/22 1635)  morphine (PF) 2 MG/ML injection 2 mg (2 mg Intravenous Given 02/02/22 1636)  terbutaline (BRETHINE) injection 0.25 mg (0.25 mg Subcutaneous Given by Other 02/02/22 1626)  morphine (PF) 2 MG/ML  injection 2 mg (2 mg Intravenous Given 02/02/22 1730)  terbutaline (BRETHINE) injection 0.25 mg (0.25 mg Subcutaneous Given 02/02/22 1728)  morphine (PF) 2 MG/ML injection 2 mg (2 mg Intravenous Given 02/02/22 2116)    ED Course/ Medical Decision Making/ A&P                             Medical Decision Making Amount and/or Complexity of Data Reviewed Labs: ordered.  Risk Prescription drug management.   Patient last normal at 0 500.  Will go ahead and treat with IV morphine some terbutaline maybe have him do some squats.  See if we get this under control without having to drain and inject phenylephrine.  But it did require that the last time.  This has been going on for a long period of time so unlikely to be easily resolvable at this point.  Originally ordered morphine.  May give another 2.  Originally ordered the terbutaline at 0.25.  May give a second dose.  This was subcu.  Following the 30 cc removal of blood from the corpus cavernosum through aspiration.  And  then injecting the phenylephrine.  When head and applied pressure and was able to milk a lot of the blood out good improvement but not completely flaccid.  But did not expect him to be completely flaccid because this has been in the erected state definitely since 6:00 this morning.  Patient will go home and if it gets worse he will follow-up with urology at [facility].  Patient will also follow-up outpatient with with urology information provided.   Final Clinical Impression(s) / ED Diagnoses Final diagnoses:  Priapism    Rx / DC Orders ED Discharge Orders     None         Fredia Sorrow, MD 02/02/22 1648    Fredia Sorrow, MD 02/03/22 [phone]

## 2022-02-02 NOTE — ED Notes (Signed)
Ice pack for pain relief

## 2022-02-02 NOTE — ED Triage Notes (Signed)
Patient presents to ED via POV from home. Here with erection since 0600 this morning. Denies it being drug induced.

## 2022-02-03 NOTE — Discharge Instructions (Addendum)
Make an appointment to follow-up with [facility].  Information provided above on Monday call them.  Go home tonight and relax.  If the erection reoccurs and gets really hard and has not resolved by morning would recommend going to [facility] emergency department because urology will need to be seen you.

## 2022-02-16 ENCOUNTER — Other Ambulatory Visit: Payer: Self-pay | Admitting: Adult Health

## 2022-02-16 DIAGNOSIS — E119 Type 2 diabetes mellitus without complications: Secondary | ICD-10-CM

## 2022-02-25 ENCOUNTER — Other Ambulatory Visit: Payer: Self-pay | Admitting: Adult Health

## 2022-02-26 NOTE — Telephone Encounter (Signed)
Name from pharmacy: TRIAMCINOLONE  0.1%  CRE       Will file in chart as: triamcinolone cream (KENALOG) 0.1 %   The original prescription was discontinued on 05/08/2021 by Dorothyann Peng, NP. Renewing this prescription may not be appropriate.

## 2022-03-07 ENCOUNTER — Other Ambulatory Visit: Payer: Self-pay | Admitting: Adult Health

## 2022-03-07 ENCOUNTER — Encounter: Payer: Self-pay | Admitting: Adult Health

## 2022-03-07 NOTE — Telephone Encounter (Signed)
Pt has been scheduled.  °

## 2022-03-08 ENCOUNTER — Other Ambulatory Visit (HOSPITAL_COMMUNITY): Payer: Self-pay

## 2022-03-08 ENCOUNTER — Encounter (HOSPITAL_COMMUNITY): Payer: Self-pay

## 2022-03-08 MED ORDER — ZALEPLON 10 MG PO CAPS
10.0000 mg | ORAL_CAPSULE | Freq: Every evening | ORAL | 2 refills | Status: DC | PRN
  Filled 2022-03-08: qty 30, 30d supply, fill #0

## 2022-03-08 NOTE — Telephone Encounter (Signed)
Okay for refill?  

## 2022-03-13 ENCOUNTER — Other Ambulatory Visit: Payer: Self-pay

## 2022-03-15 ENCOUNTER — Ambulatory Visit (INDEPENDENT_AMBULATORY_CARE_PROVIDER_SITE_OTHER): Payer: 59 | Admitting: Adult Health

## 2022-03-15 ENCOUNTER — Encounter: Payer: Self-pay | Admitting: Adult Health

## 2022-03-15 ENCOUNTER — Other Ambulatory Visit (HOSPITAL_COMMUNITY)
Admission: RE | Admit: 2022-03-15 | Discharge: 2022-03-15 | Disposition: A | Discharge status: Home / Self Care | Payer: 59 | Source: Ambulatory Visit | Attending: Adult Health | Admitting: Adult Health

## 2022-03-15 VITALS — BP 132/84 | HR 64 | Temp 98.1°F | Ht 71.0 in | Wt 224.0 lb

## 2022-03-15 DIAGNOSIS — F17200 Nicotine dependence, unspecified, uncomplicated: Secondary | ICD-10-CM

## 2022-03-15 DIAGNOSIS — Z113 Encounter for screening for infections with a predominantly sexual mode of transmission: Secondary | ICD-10-CM

## 2022-03-15 DIAGNOSIS — N4830 Priapism, unspecified: Secondary | ICD-10-CM | POA: Diagnosis not present

## 2022-03-15 DIAGNOSIS — F5101 Primary insomnia: Secondary | ICD-10-CM | POA: Diagnosis not present

## 2022-03-15 DIAGNOSIS — E119 Type 2 diabetes mellitus without complications: Secondary | ICD-10-CM | POA: Diagnosis not present

## 2022-03-15 LAB — POCT GLYCOSYLATED HEMOGLOBIN (HGB A1C): Hemoglobin A1C: 6.3 % — AB (ref 4.0–5.6)

## 2022-03-15 MED ORDER — ZALEPLON 10 MG PO CAPS: 20.0000 mg | ORAL_CAPSULE | Freq: Every evening | ORAL | 2 refills | Status: DC | PRN

## 2022-03-15 NOTE — Patient Instructions (Signed)
Health Maintenance Due  Topic Date Due   COVID-19 Vaccine (1) Never done   OPHTHALMOLOGY EXAM  Never done   FOOT EXAM  02/10/2021   INFLUENZA VACCINE  08/07/2021      Row Labels 03/15/2022    9:38 AM 11/08/2021    4:10 PM 02/14/2021    2:38 PM  Depression screen PHQ 2/9   Section Header. No data exists in this row.     Decreased Interest   '1 3 2  '$ Down, Depressed, Hopeless   '1 2 1  '$ PHQ - 2 Score   '2 5 3  '$ Altered sleeping   '3 1 1  '$ Tired, decreased energy   '3 2 1  '$ Change in appetite   '1 3 3  '$ Feeling bad or failure about yourself    '1 2 3  '$ Trouble concentrating   0 2 1  Moving slowly or fidgety/restless   0 0 0  Suicidal thoughts   0 0 1  PHQ-9 Score   '10 15 13  '$ Difficult doing work/chores   Not difficult at all Not difficult at all Not difficult at all

## 2022-03-15 NOTE — Progress Notes (Signed)
Subjective:    Patient ID: Joseph Barry, male    DOB: 05/02/1980, 42 y.o.   MRN: [id number]  HPI 42 year old male who  has a past medical history of Depression, Diabetes mellitus type II, uncontrolled, Eczema, Finger laceration involving tendon (05/20/2014), GERD (gastroesophageal reflux disease), History of cardiac murmur as a child, History of MRSA infection (~ 2007), Hypertension, Migraines, and Sleep apnea.  He presents to the office today for follow up regarding multiple issues  DM Type 2 -when he was seen 3 months ago he was on Mounjaro 5 mg weekly, he had lost about 67 pounds total with he is in [place] and actually had to back down on the dosing due to hypoglycemic episodes.  During his last visit he continued to complain of hypoglycemic episodes, at home he stopped using Mounjaro for about a month to see what his blood sugars would be and he reported they were stable in the 110s to 120s.  Since he was eating healthy and exercising we decided to cut [facility] out completely.  Since coming off Mounjaro his blood sugars have been in goal 97% of the time. He has been stress eating more sugars and salty foods. Is weight lifting more and running less. His average glucose is 135 over the last 90 days.   Wt Readings from Last 3 Encounters:  03/15/22 224 lb (101.6 kg)  02/02/22 200 lb (90.7 kg)  12/07/21 195 lb (88.5 kg)   Tobacco Use - was started on Wellbutrin 150 mg and was able to stop smoking for a couple of months but over the last two weeks he has smoked 10 cigarettes.   Priapism -he has had 2 episodes of priapism over the last year.  Both times he went to the emergency room.  During the last time in January 2024 he was advised to follow-up with urology but never did so.  He would like a referral to urology  STD screening -he is in a new relationship and would like to be checked for STDs.  Not currently having any signs or symptoms.  Insomnia -currently maintained on Sonata 10 mg.   He reports that this does help him sleep longer but he still has trouble falling asleep. He does practice good sleep hygiene   Review of Systems See HPI   Past Medical History:  Diagnosis Date   Depression    Diabetes mellitus type II, uncontrolled    Eczema    Finger laceration involving tendon 05/20/2014   right index, long, ring, small fingers - tendon/artery/nerve lac.   GERD (gastroesophageal reflux disease)    History of cardiac murmur as a child    History of MRSA infection ~ 2007   back   Hypertension    states under control with med., has been on med. x 1 yr.   Migraines    Sleep apnea    uses CPAP nightly    Social History   Socioeconomic History   Marital status: Married    Spouse name: vanessa   Number of children: Not on file   Years of education: Not on file   Highest education level: Associate degree: occupational, Hotel manager, or vocational program  Occupational History   Not on file  Tobacco Use   Smoking status: Every Day    Types: Cigars   Smokeless tobacco: Never   Tobacco comments:    5 cigars/day  Substance and Sexual Activity   Alcohol use: Yes    Alcohol/week: 0.0 standard drinks  of alcohol    Comment: occasionally   Drug use: No   Sexual activity: Not on file  Other Topics Concern   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    Married for five years   Has step daughter.    Has rabbit and dog.       Lives alone   Right handed   Caffeine: 1 cup of coffee every month   Social Determinants of Health   Financial Resource Strain: Low Risk  (11/07/2021)   Overall Financial Resource Strain (CARDIA)    Difficulty of Paying Living Expenses: Not very hard  Food Insecurity: No Food Insecurity (11/07/2021)   Hunger Vital Sign    Worried About Running Out of Food in the Last Year: Never true    Ran Out of Food in the Last Year: Never true  Transportation Needs: No Transportation Needs  (11/07/2021)   PRAPARE - Hydrologist (Medical): No    Lack of Transportation (Non-Medical): No  Physical Activity: Insufficiently Active (11/07/2021)   Exercise Vital Sign    Days of Exercise per Week: 1 day    Minutes of Exercise per Session: 60 min  Stress: Stress Concern Present (11/07/2021)   [place]    Feeling of Stress : Very much  Social Connections: Socially Isolated (11/07/2021)   Social Connection and Isolation Panel [NHANES]    Frequency of Communication with Friends and Family: Three times a week    Frequency of Social Gatherings with Friends and Family: Twice a week    Attends Religious Services: Never    Marine scientist or Organizations: No    Attends Music therapist: Not on file    Marital Status: Separated  Intimate Partner Violence: Not on file    Past Surgical History:  Procedure Laterality Date   NERVE, TENDON AND ARTERY REPAIR Right 05/24/2014   Procedure: RIGHT INDEX LONG RING SMALL/REPAIR TENDON, ARTERY NERVE;  Surgeon: Leanora Cover, MD;  Location: [facility];  Service: Orthopedics;  Laterality: Right;   ORBITAL FRACTURE SURGERY Left 2003    Family History  Problem Relation Age of Onset   Arthritis Maternal Grandmother    Diabetes Maternal Grandmother    Heart disease Maternal Grandmother    Hypertension Father    Diabetes Father    Colon polyps Father    Hypertension Mother    Heart disease Mother    COPD Mother    Cancer Paternal Grandmother        spine cancer   Hypertension Sister     Allergies  Allergen Reactions   Shellfish Allergy Shortness Of Breath and Swelling   Tape     rash   Trazodone And Nefazodone     Priapism     Current Outpatient Medications on File Prior to Visit  Medication Sig Dispense Refill   buPROPion (WELLBUTRIN XL) 150 MG 24 hr tablet TAKE 1 TABLET BY MOUTH DAILY 90 tablet 3   Continuous  Blood Gluc Receiver (FREESTYLE LIBRE 14 DAY READER) DEVI USE ONE DEVICE EVERY 14 DAYS TO MONITOR BLOOD GLUCOSE 6 each 2   Continuous Blood Gluc Sensor (FREESTYLE LIBRE 3 SENSOR) MISC PLACE 1 SENSOR ON THE SKIN AND  CHANGE EVERY 14 DAYS USE TO  CHECK BLOOD GLUCOSE CONTINUOUSLY 6 each 3   Lancets (ONETOUCH DELICA PLUS Q000111Q) MISC USE TO CHECK  BLOOD SUGAR 3 TIMES DAILY 300 each 3   ONETOUCH VERIO test strip USE TO TEST BLOOD GLUCOSE 3  TIMES DAILY 150 strip 6   simvastatin (ZOCOR) 20 MG tablet TAKE 1 TABLET BY MOUTH AT  BEDTIME 90 tablet 3   triamcinolone cream (KENALOG) 0.1 % APPLY TOPICALLY TO AFFECTED  AREA(S) TWICE DAILY 45 g 2   tirzepatide (MOUNJARO) 5 MG/0.5ML Pen Inject 5 mg into the skin once a week. (Patient not taking: Reported on 03/15/2022) 6 mL 1   No current facility-administered medications on file prior to visit.    BP 132/84   Pulse 64   Temp 98.1 F (36.7 C) (Oral)   Ht '5\' 11"'$  (1.803 m)   Wt 224 lb (101.6 kg)   SpO2 99%   BMI 31.24 kg/m       Objective:   Physical Exam Vitals and nursing note reviewed.  Constitutional:      Appearance: Normal appearance.  Cardiovascular:     Rate and Rhythm: Normal rate and regular rhythm.     Pulses: Normal pulses.     Heart sounds: Normal heart sounds.  Pulmonary:     Effort: Pulmonary effort is normal.     Breath sounds: Normal breath sounds.  Musculoskeletal:        General: Normal range of motion.  Skin:    General: Skin is warm and dry.  Neurological:     General: No focal deficit present.     Mental Status: He is alert and oriented to person, place, and time.  Psychiatric:        Mood and Affect: Mood normal.        Behavior: Behavior normal.        Thought Content: Thought content normal.        Judgment: Judgment normal.        Assessment & Plan:  1. Diabetes mellitus without complication ([place])  - POC HgB A1c- 6.3 -has increased and so his his weight, his weight is up roughly 24 pounds.  He was  encouraged to work on reducing sugars and sweets.  Will keep him off Mounjaro for another 3 months to see how he does and turning his diet around.  Will have him follow-up in 3 months for repeat testing.   2. Priapism  - Ambulatory referral to Urology  3. Screening examination for STD (sexually transmitted disease)  - Urine cytology ancillary only - HIV Antibody (routine testing w rflx); Future - RPR; Future - RPR - HIV Antibody (routine testing w rflx)  4. Primary insomnia - Will increase Sonata to 20 mg nightly.  - Follow up if this does not resolve issue  - zaleplon (SONATA) 10 MG capsule; Take 2 capsules (20 mg total) by mouth at bedtime as needed for sleep.  Dispense: 60 capsule; Refill: 2  5. Tobacco use disorder - Encouraged to quit completely   Time spent with patient today was 42 minutes which consisted of chart review, discussing DM, insomnia, priapism, and smoking, work up, treatment answering questions and documentation.

## 2022-03-16 LAB — RPR: RPR Ser Ql: NONREACTIVE

## 2022-03-17 LAB — HIV ANTIBODY (ROUTINE TESTING W REFLEX): HIV 1&2 Ab, 4th Generation: NONREACTIVE

## 2022-03-18 LAB — URINE CYTOLOGY ANCILLARY ONLY
Chlamydia: NEGATIVE
Comment: NEGATIVE
Comment: NEGATIVE
Comment: NORMAL
Neisseria Gonorrhea: NEGATIVE
Trichomonas: NEGATIVE

## 2022-04-03 ENCOUNTER — Other Ambulatory Visit: Payer: Self-pay | Admitting: *Deleted

## 2022-04-03 DIAGNOSIS — F5101 Primary insomnia: Secondary | ICD-10-CM

## 2022-04-03 MED ORDER — ZALEPLON 10 MG PO CAPS: 20.0000 mg | ORAL_CAPSULE | Freq: Every evening | ORAL | 2 refills | Status: DC | PRN

## 2022-04-03 NOTE — Telephone Encounter (Signed)
Spoke with patient and he would like to "stick with the 20 mg".

## 2022-04-03 NOTE — Telephone Encounter (Signed)
Patient is requesting a refill of  zaleplon (SONATA) 10 MG capsule. Last refill went to [facility]. Patient is requesting refills go to [facility]

## 2022-06-18 ENCOUNTER — Encounter: Payer: Self-pay | Admitting: Adult Health

## 2022-06-18 ENCOUNTER — Ambulatory Visit (INDEPENDENT_AMBULATORY_CARE_PROVIDER_SITE_OTHER): Payer: 59 | Admitting: Adult Health

## 2022-06-18 VITALS — BP 134/86 | HR 75 | Temp 98.3°F | Ht 71.0 in | Wt 237.0 lb

## 2022-06-18 DIAGNOSIS — Z7985 Long-term (current) use of injectable non-insulin antidiabetic drugs: Secondary | ICD-10-CM

## 2022-06-18 DIAGNOSIS — E119 Type 2 diabetes mellitus without complications: Secondary | ICD-10-CM | POA: Diagnosis not present

## 2022-06-18 LAB — POCT GLYCOSYLATED HEMOGLOBIN (HGB A1C): Hemoglobin A1C: 6.6 % — AB (ref 4.0–5.6)

## 2022-06-18 NOTE — Patient Instructions (Signed)
Health Maintenance Due  Topic Date Due   COVID-19 Vaccine (1) Never done   OPHTHALMOLOGY EXAM  Never done   FOOT EXAM  02/10/2021   Diabetic kidney evaluation - Urine ACR  05/09/2022      Row Labels 03/15/2022    9:38 AM 11/08/2021    4:10 PM 02/14/2021    2:38 PM  Depression screen PHQ 2/9   Section Header. No data exists in this row.     Decreased Interest   1 3 2   Down, Depressed, Hopeless   1 2 1   PHQ - 2 Score   2 5 3   Altered sleeping   3 1 1   Tired, decreased energy   3 2 1   Change in appetite   1 3 3   Feeling bad or failure about yourself    1 2 3   Trouble concentrating   0 2 1  Moving slowly or fidgety/restless   0 0 0  Suicidal thoughts   0 0 1  PHQ-9 Score   10 15 13   Difficult doing work/chores   Not difficult at all Not difficult at all Not difficult at all

## 2022-06-18 NOTE — Progress Notes (Signed)
Subjective:    Patient ID: Joseph Barry, male    DOB: 1980/08/17, 42 y.o.   MRN: [id number]  HPI 42 year old male who  has a past medical history of Depression, Diabetes mellitus type II, uncontrolled, Eczema, Finger laceration involving tendon (05/20/2014), GERD (gastroesophageal reflux disease), History of cardiac murmur as a child, History of MRSA infection (~ 2007), Hypertension, Migraines, and Sleep apnea.  DM Type 2 -when he was seen 3 monthshe had been off Mounjaro for about 3 months. His weight had gone up 24 pounds. He came off mounjaro due to hypoglycemic events.When he was last seen his blood sugars were in goal 97% of the time but he was stress eating and consuming more sugars and salty foods. He was lifting weights more and doing less aerobic exercise.   Today he reports that he is eating a lot of quick meals - more processed foods. He continues to exercise though.   His blood sugars continues to be well controlled with his 90 day average of being 125. He is in goal 96% of the time and 4% elevated.   He does report that he recently changed jobs and has less stress. He is now able to work out more and cook his own food  Wt Readings from Last 3 Encounters:  06/18/22 237 lb (107.5 kg)  03/15/22 224 lb (101.6 kg)  02/02/22 200 lb (90.7 kg)   Lab Results  Component Value Date   HGBA1C 6.6 (A) 06/18/2022   Review of Systems See HPI   Past Medical History:  Diagnosis Date   Depression    Diabetes mellitus type II, uncontrolled    Eczema    Finger laceration involving tendon 05/20/2014   right index, long, ring, small fingers - tendon/artery/nerve lac.   GERD (gastroesophageal reflux disease)    History of cardiac murmur as a child    History of MRSA infection ~ 2007   back   Hypertension    states under control with med., has been on med. x 1 yr.   Migraines    Sleep apnea    uses CPAP nightly    Social History   Socioeconomic History   Marital status: Married     Spouse name: vanessa   Number of children: Not on file   Years of education: Not on file   Highest education level: Associate degree: occupational, Scientist, product/process development, or vocational program  Occupational History   Not on file  Tobacco Use   Smoking status: Every Day    Types: Cigars   Smokeless tobacco: Never   Tobacco comments:    5 cigars/day  Substance and Sexual Activity   Alcohol use: Yes    Alcohol/week: 0.0 standard drinks of alcohol    Comment: occasionally   Drug use: No   Sexual activity: Not on file  Other Topics Concern   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    Married for five years   Has step daughter.    Has rabbit and dog.       Lives alone   Right handed   Caffeine: 1 cup of coffee every month   Social Determinants of Health   Financial Resource Strain: Low Risk  (11/07/2021)   Overall Financial Resource Strain (CARDIA)    Difficulty of Paying Living Expenses: Not very hard  Food Insecurity: No Food Insecurity (11/07/2021)   Hunger Vital Sign    Worried About  Running Out of Food in the Last Year: Never true    Ran Out of Food in the Last Year: Never true  Transportation Needs: No Transportation Needs (11/07/2021)   PRAPARE - Administrator, Civil Service (Medical): No    Lack of Transportation (Non-Medical): No  Physical Activity: Insufficiently Active (11/07/2021)   Exercise Vital Sign    Days of Exercise per Week: 1 day    Minutes of Exercise per Session: 60 min  Stress: Stress Concern Present (11/07/2021)   [employer] of Occupational Health - Occupational Stress Questionnaire    Feeling of Stress : Very much  Social Connections: Socially Isolated (11/07/2021)   Social Connection and Isolation Panel [NHANES]    Frequency of Communication with Friends and Family: Three times a week    Frequency of Social Gatherings with Friends and Family: Twice a week    Attends Religious  Services: Never    Database administrator or Organizations: No    Attends Engineer, structural: Not on file    Marital Status: Separated  Intimate Partner Violence: Not on file    Past Surgical History:  Procedure Laterality Date   NERVE, TENDON AND ARTERY REPAIR Right 05/24/2014   Procedure: RIGHT INDEX LONG RING SMALL/REPAIR TENDON, ARTERY NERVE;  Surgeon: Betha Loa, MD;  Location: [facility];  Service: Orthopedics;  Laterality: Right;   ORBITAL FRACTURE SURGERY Left 2003    Family History  Problem Relation Age of Onset   Arthritis Maternal Grandmother    Diabetes Maternal Grandmother    Heart disease Maternal Grandmother    Hypertension Father    Diabetes Father    Colon polyps Father    Hypertension Mother    Heart disease Mother    COPD Mother    Cancer Paternal Grandmother        spine cancer   Hypertension Sister     Allergies  Allergen Reactions   Shellfish Allergy Shortness Of Breath and Swelling   Tape     rash   Trazodone And Nefazodone     Priapism     Current Outpatient Medications on File Prior to Visit  Medication Sig Dispense Refill   buPROPion (WELLBUTRIN XL) 150 MG 24 hr tablet TAKE 1 TABLET BY MOUTH DAILY 90 tablet 3   Continuous Blood Gluc Receiver (FREESTYLE LIBRE 14 DAY READER) DEVI USE ONE DEVICE EVERY 14 DAYS TO MONITOR BLOOD GLUCOSE 6 each 2   Continuous Blood Gluc Sensor (FREESTYLE LIBRE 3 SENSOR) MISC PLACE 1 SENSOR ON THE SKIN AND  CHANGE EVERY 14 DAYS USE TO  CHECK BLOOD GLUCOSE CONTINUOUSLY 6 each 3   Lancets (ONETOUCH DELICA PLUS LANCET30G) MISC USE TO CHECK BLOOD SUGAR 3 TIMES DAILY 300 each 3   ONETOUCH VERIO test strip USE TO TEST BLOOD GLUCOSE 3  TIMES DAILY 150 strip 6   simvastatin (ZOCOR) 20 MG tablet TAKE 1 TABLET BY MOUTH AT  BEDTIME 90 tablet 3   triamcinolone cream (KENALOG) 0.1 % APPLY TOPICALLY TO AFFECTED  AREA(S) TWICE DAILY 45 g 2   zaleplon (SONATA) 10 MG capsule Take 2 capsules (20 mg total) by  mouth at bedtime as needed for sleep. 60 capsule 2   No current facility-administered medications on file prior to visit.    BP 134/86   Pulse 75   Temp 98.3 F (36.8 C) (Oral)   Ht 5\' 11"  (1.803 m)   Wt 237 lb (107.5 kg)   SpO2 97%  BMI 33.05 kg/m       Objective:   Physical Exam Vitals and nursing note reviewed.  Constitutional:      Appearance: Normal appearance.  Cardiovascular:     Rate and Rhythm: Normal rate and regular rhythm.     Pulses: Normal pulses.     Heart sounds: Normal heart sounds.  Pulmonary:     Effort: Pulmonary effort is normal.     Breath sounds: Normal breath sounds.  Skin:    General: Skin is warm and dry.  Neurological:     General: No focal deficit present.     Mental Status: He is alert and oriented to person, place, and time.  Psychiatric:        Mood and Affect: Mood normal.        Behavior: Behavior normal.        Thought Content: Thought content normal.        Judgment: Judgment normal.        Assessment & Plan:  1. Diabetes mellitus without complication ([facility])  - POC HgB A1c- 6.6  - His A1c has increased  - Weight is up 37 lbs since stopping mounjaro.  - He wants three months to get back into working out more often and cooking healthier - If A1c continues to increase with weight then will place back on Mounjaro 2.5 mg weekly. I do not think he needs high dose of Mounjaro   Shirline Frees, NP  Time spent with patient today was 35 minutes which consisted of chart review, discussing  DM type2, work up, treatment answering questions and documentation.

## 2022-06-22 ENCOUNTER — Other Ambulatory Visit: Payer: Self-pay | Admitting: Adult Health

## 2022-06-22 DIAGNOSIS — E1165 Type 2 diabetes mellitus with hyperglycemia: Secondary | ICD-10-CM

## 2022-08-22 ENCOUNTER — Encounter (INDEPENDENT_AMBULATORY_CARE_PROVIDER_SITE_OTHER): Payer: Self-pay

## 2022-09-18 ENCOUNTER — Ambulatory Visit (INDEPENDENT_AMBULATORY_CARE_PROVIDER_SITE_OTHER): Payer: 59 | Admitting: Adult Health

## 2022-09-18 ENCOUNTER — Encounter: Payer: Self-pay | Admitting: Adult Health

## 2022-09-18 ENCOUNTER — Other Ambulatory Visit: Payer: Self-pay | Admitting: Adult Health

## 2022-09-18 VITALS — BP 120/84 | HR 81 | Temp 98.4°F | Ht 71.0 in | Wt 242.0 lb

## 2022-09-18 DIAGNOSIS — F329 Major depressive disorder, single episode, unspecified: Secondary | ICD-10-CM

## 2022-09-18 DIAGNOSIS — E119 Type 2 diabetes mellitus without complications: Secondary | ICD-10-CM

## 2022-09-18 DIAGNOSIS — Z7985 Long-term (current) use of injectable non-insulin antidiabetic drugs: Secondary | ICD-10-CM

## 2022-09-18 DIAGNOSIS — E782 Mixed hyperlipidemia: Secondary | ICD-10-CM

## 2022-09-18 LAB — POCT GLYCOSYLATED HEMOGLOBIN (HGB A1C): Hemoglobin A1C: 7.2 % — AB (ref 4.0–5.6)

## 2022-09-18 MED ORDER — FREESTYLE LIBRE 3 PLUS SENSOR MISC: 1.0000 | 6 refills | Status: DC

## 2022-09-18 MED ORDER — BUPROPION HCL ER (XL) 150 MG PO TB24: 150.0000 mg | ORAL_TABLET | Freq: Every day | ORAL | 0 refills | Status: DC

## 2022-09-18 MED ORDER — SIMVASTATIN 20 MG PO TABS: 20.0000 mg | ORAL_TABLET | Freq: Every day | ORAL | 0 refills | Status: DC

## 2022-09-18 MED ORDER — TIRZEPATIDE 2.5 MG/0.5ML ~~LOC~~ SOAJ: 2.5000 mg | SUBCUTANEOUS | 2 refills | Status: DC

## 2022-09-18 NOTE — Progress Notes (Addendum)
Subjective:    Patient ID: Joseph Barry, male    DOB: December 29, 1980, 42 y.o.   MRN: [id number]  HPI  He presents to the office today for follow up regarding DM Type 2   He was seen 3 months ago he had been off Mounjaro for roughly 6 months.  He stopped Mounjaro due to significant weight loss and hypoglycemic events.  Back in June 2024 his weight was up roughly 37 pounds since stopping Mounjaro.  His A1c was 6.6 but sugars were in goal 97% of the time.  He reported that he was stressed eating and consuming more sugars and salty foods.  We discussed going back on low-dose Mounjaro but he wanted to hold off for another 3 months to see if he could "get his act together" and get back into the gym and eating healthy. He reports that he has not really been going to the gym and is eating too much.   Wt Readings from Last 10 Encounters:  09/18/22 242 lb (109.8 kg)  06/18/22 237 lb (107.5 kg)  03/15/22 224 lb (101.6 kg)  02/02/22 200 lb (90.7 kg)  12/07/21 195 lb (88.5 kg)  11/08/21 193 lb (87.5 kg)  06/25/21 193 lb (87.5 kg)  06/13/21 190 lb (86.2 kg)  06/12/21 196 lb (88.9 kg)  05/08/21 192 lb (87.1 kg)   He also needs refills of Wellbutrin and Simvastatin as he is no longer with [facility]   Review of Systems See HPI   Past Medical History:  Diagnosis Date   Depression    Diabetes mellitus type II, uncontrolled    Eczema    Finger laceration involving tendon 05/20/2014   right index, long, ring, small fingers - tendon/artery/nerve lac.   GERD (gastroesophageal reflux disease)    History of cardiac murmur as a child    History of MRSA infection ~ 2007   back   Hypertension    states under control with med., has been on med. x 1 yr.   Migraines    Sleep apnea    uses CPAP nightly    Social History   Socioeconomic History   Marital status: Divorced    Spouse name: vanessa   Number of children: Not on file   Years of education: Not on file   Highest education level: Associate  degree: occupational, Scientist, product/process development, or vocational program  Occupational History   Not on file  Tobacco Use   Smoking status: Every Day    Types: Cigars   Smokeless tobacco: Never   Tobacco comments:    5 cigars/day  Substance and Sexual Activity   Alcohol use: Yes    Alcohol/week: 0.0 standard drinks of alcohol    Comment: occasionally   Drug use: No   Sexual activity: Not on file  Other Topics Concern   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    Married for five years   Has step daughter.    Has rabbit and dog.       Lives alone   Right handed   Caffeine: 1 cup of coffee every month   Social Determinants of Health   Financial Resource Strain: Low Risk  (11/07/2021)   Overall Financial Resource Strain (CARDIA)    Difficulty of Paying Living Expenses: Not very hard  Food Insecurity: No Food Insecurity (11/07/2021)   Hunger Vital Sign    Worried About Running Out of Food in the Last  Year: Never true    Ran Out of Food in the Last Year: Never true  Transportation Needs: No Transportation Needs (11/07/2021)   PRAPARE - Administrator, Civil Service (Medical): No    Lack of Transportation (Non-Medical): No  Physical Activity: Insufficiently Active (11/07/2021)   Exercise Vital Sign    Days of Exercise per Week: 1 day    Minutes of Exercise per Session: 60 min  Stress: Stress Concern Present (11/07/2021)   [employer] of Occupational Health - Occupational Stress Questionnaire    Feeling of Stress : Very much  Social Connections: Socially Isolated (11/07/2021)   Social Connection and Isolation Panel [NHANES]    Frequency of Communication with Friends and Family: Three times a week    Frequency of Social Gatherings with Friends and Family: Twice a week    Attends Religious Services: Never    Database administrator or Organizations: No    Attends Engineer, structural: Not on file    Marital  Status: Separated  Intimate Partner Violence: Unknown (04/10/2021)   Received from [facility], [facility]   HITS    Physically Hurt: Not on file    Insult or Talk Down To: Not on file    Threaten Physical Harm: Not on file    Scream or Curse: Not on file    Past Surgical History:  Procedure Laterality Date   NERVE, TENDON AND ARTERY REPAIR Right 05/24/2014   Procedure: RIGHT INDEX LONG RING SMALL/REPAIR TENDON, ARTERY NERVE;  Surgeon: Betha Loa, MD;  Location: [facility];  Service: Orthopedics;  Laterality: Right;   ORBITAL FRACTURE SURGERY Left 2003    Family History  Problem Relation Age of Onset   Arthritis Maternal Grandmother    Diabetes Maternal Grandmother    Heart disease Maternal Grandmother    Hypertension Father    Diabetes Father    Colon polyps Father    Hypertension Mother    Heart disease Mother    COPD Mother    Cancer Paternal Grandmother        spine cancer   Hypertension Sister     Allergies  Allergen Reactions   Shellfish Allergy Shortness Of Breath and Swelling   Tape     rash   Trazodone And Nefazodone     Priapism     Current Outpatient Medications on File Prior to Visit  Medication Sig Dispense Refill   Lancets (ONETOUCH DELICA PLUS LANCET30G) MISC USE TO CHECK BLOOD SUGAR 3 TIMES DAILY 300 each 3   ONETOUCH VERIO test strip USE TO TEST BLOOD GLUCOSE 3  TIMES DAILY 150 strip 6   triamcinolone cream (KENALOG) 0.1 % APPLY TOPICALLY TO AFFECTED  AREA(S) TWICE DAILY 45 g 2   zaleplon (SONATA) 10 MG capsule Take 2 capsules (20 mg total) by mouth at bedtime as needed for sleep. 60 capsule 2   No current facility-administered medications on file prior to visit.    BP 120/84   Pulse 81   Temp 98.4 F (36.9 C) (Oral)   Ht 5\' 11"  (1.803 m)   Wt 242 lb (109.8 kg)   SpO2 98%   BMI 33.75 kg/m       Objective:   Physical Exam Vitals and nursing note reviewed.  Constitutional:      Appearance: Normal appearance.   Cardiovascular:     Rate and Rhythm: Normal rate and regular rhythm.     Pulses: Normal pulses.  Heart sounds: Normal heart sounds.  Pulmonary:     Breath sounds: Normal breath sounds.  Musculoskeletal:        General: Normal range of motion.  Skin:    General: Skin is warm and dry.  Neurological:     General: No focal deficit present.     Mental Status: He is alert and oriented to person, place, and time.  Psychiatric:        Mood and Affect: Mood normal.        Behavior: Behavior normal.        Thought Content: Thought content normal.        Judgment: Judgment normal.        Assessment & Plan:  1. Diabetes mellitus without complication (HCC)  - POC HgB A1c- 7.2  - Continuous Glucose Sensor (FREESTYLE LIBRE 3 PLUS SENSOR) MISC; 1 Device by Does not apply route every 14 (fourteen) days.  Dispense: 2 each; Refill: 6  2. Long-term current use of injectable noninsulin antidiabetic medication - Will restart on Mounjaro 2.5 mg weekly since he does well with this medication  - Follow up in 3 months for CPE  - tirzepatide [facility]) 2.5 MG/0.5ML Pen; Inject 2.5 mg into the skin once a week.  Dispense: 2 mL; Refill: 2  3. Mixed hyperlipidemia  - simvastatin (ZOCOR) 20 MG tablet; Take 1 tablet (20 mg total) by mouth at bedtime.  Dispense: 90 tablet; Refill: 0  4. Reactive depression  - buPROPion (WELLBUTRIN XL) 150 MG 24 hr tablet; Take 1 tablet (150 mg total) by mouth daily.  Dispense: 90 tablet; Refill: 0  Shirline Frees, NP

## 2022-09-18 NOTE — Patient Instructions (Signed)
Health Maintenance Due  Topic Date Due   OPHTHALMOLOGY EXAM  Never done   FOOT EXAM  02/10/2021   Diabetic kidney evaluation - Urine ACR  05/09/2022   INFLUENZA VACCINE  08/08/2022   COVID-19 Vaccine (1 - 2023-24 season) Never done       06/18/2022    5:04 PM 03/15/2022    9:38 AM 11/08/2021    4:10 PM  Depression screen PHQ 2/9  Decreased Interest 2 1 3   Down, Depressed, Hopeless 2 1 2   PHQ - 2 Score 4 2 5   Altered sleeping 1 3 1   Tired, decreased energy 1 3 2   Change in appetite 3 1 3   Feeling bad or failure about yourself  2 1 2   Trouble concentrating 2 0 2  Moving slowly or fidgety/restless 0 0 0  Suicidal thoughts 2 0 0  PHQ-9 Score 15 10 15   Difficult doing work/chores  Not difficult at all Not difficult at all

## 2022-10-15 ENCOUNTER — Encounter: Payer: Self-pay | Admitting: Adult Health

## 2022-10-15 ENCOUNTER — Ambulatory Visit: Payer: 59 | Admitting: Adult Health

## 2022-10-15 VITALS — BP 140/100 | HR 87 | Temp 98.2°F | Ht 71.0 in | Wt 239.0 lb

## 2022-10-15 DIAGNOSIS — A630 Anogenital (venereal) warts: Secondary | ICD-10-CM | POA: Diagnosis not present

## 2022-10-15 DIAGNOSIS — Z7985 Long-term (current) use of injectable non-insulin antidiabetic drugs: Secondary | ICD-10-CM

## 2022-10-15 DIAGNOSIS — E119 Type 2 diabetes mellitus without complications: Secondary | ICD-10-CM | POA: Diagnosis not present

## 2022-10-15 NOTE — Progress Notes (Signed)
Subjective:    Patient ID: Joseph Barry, male    DOB: [birth year], 42 y.o.   MRN: [id number]  HPI  He presents to the office today for follow-up regarding multiple issues.  Month we started him back on Mounjaro 2.5 mg for management of diabetes mellitus and obesity.  In the past we had to pull him off Mounjaro due to significant weight loss.  Since going back on Mounjaro 2.5 mg he has been tolerating the medication well with slight fatigue and gas which is better than it was when he previously started the medication.  He has also noticed that it has decreased his appetite and his portion size has diminished.  He is happy with the 2.5 mg dose for the time being.  Has a history of genital warts and has had one pop up on the right side of his penis.  He would like for me to burn this off today.   Review of Systems See HPI   Past Medical History:  Diagnosis Date   Depression    Diabetes mellitus type II, uncontrolled    Eczema    Finger laceration involving tendon 05/20/2014   right index, long, ring, small fingers - tendon/artery/nerve lac.   GERD (gastroesophageal reflux disease)    History of cardiac murmur as a child    History of MRSA infection ~ 2007   back   Hypertension    states under control with med., has been on med. x 1 yr.   Migraines    Sleep apnea    uses CPAP nightly    Social History   Socioeconomic History   Marital status: Divorced    Spouse name: vanessa   Number of children: Not on file   Years of education: Not on file   Highest education level: Associate degree: occupational, Scientist, product/process development, or vocational program  Occupational History   Not on file  Tobacco Use   Smoking status: Every Day    Types: Cigars   Smokeless tobacco: Never   Tobacco comments:    5 cigars/day  Substance and Sexual Activity   Alcohol use: Yes    Alcohol/week: 0.0 standard drinks of alcohol    Comment: occasionally   Drug use: No   Sexual activity: Not on file  Other  Topics Concern   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    Married for five years   Has step daughter.    Has rabbit and dog.       Lives alone   Right handed   Caffeine: 1 cup of coffee every month   Social Determinants of Health   Financial Resource Strain: Low Risk  (11/07/2021)   Overall Financial Resource Strain (CARDIA)    Difficulty of Paying Living Expenses: Not very hard  Food Insecurity: No Food Insecurity (11/07/2021)   Hunger Vital Sign    Worried About Running Out of Food in the Last Year: Never true    Ran Out of Food in the Last Year: Never true  Transportation Needs: No Transportation Needs (11/07/2021)   PRAPARE - Administrator, Civil Service (Medical): No    Lack of Transportation (Non-Medical): No  Physical Activity: Insufficiently Active (11/07/2021)   Exercise Vital Sign    Days of Exercise per Week: 1 day    Minutes of Exercise per Session: 60 min  Stress: Stress Concern Present (11/07/2021)   [employer] of Occupational Health -  Occupational Stress Questionnaire    Feeling of Stress : Very much  Social Connections: Socially Isolated (11/07/2021)   Social Connection and Isolation Panel [NHANES]    Frequency of Communication with Friends and Family: Three times a week    Frequency of Social Gatherings with Friends and Family: Twice a week    Attends Religious Services: Never    Database administrator or Organizations: No    Attends Engineer, structural: Not on file    Marital Status: Separated  Intimate Partner Violence: Unknown (04/10/2021)   Received from [facility], [facility]   HITS    Physically Hurt: Not on file    Insult or Talk Down To: Not on file    Threaten Physical Harm: Not on file    Scream or Curse: Not on file    Past Surgical History:  Procedure Laterality Date   NERVE, TENDON AND ARTERY REPAIR Right 05/24/2014   Procedure: RIGHT INDEX LONG  RING SMALL/REPAIR TENDON, ARTERY NERVE;  Surgeon: Betha Loa, MD;  Location: [facility];  Service: Orthopedics;  Laterality: Right;   ORBITAL FRACTURE SURGERY Left 2003    Family History  Problem Relation Age of Onset   Arthritis Maternal Grandmother    Diabetes Maternal Grandmother    Heart disease Maternal Grandmother    Hypertension Father    Diabetes Father    Colon polyps Father    Hypertension Mother    Heart disease Mother    COPD Mother    Cancer Paternal Grandmother        spine cancer   Hypertension Sister     Allergies  Allergen Reactions   Shellfish Allergy Shortness Of Breath and Swelling   Tape     rash   Trazodone And Nefazodone     Priapism     Current Outpatient Medications on File Prior to Visit  Medication Sig Dispense Refill   buPROPion (WELLBUTRIN XL) 150 MG 24 hr tablet Take 1 tablet (150 mg total) by mouth daily. 90 tablet 0   Continuous Glucose Sensor (FREESTYLE LIBRE 3 PLUS SENSOR) MISC APPLY 1 DEVICE EVERY 14 DAYS 2 each 6   Lancets (ONETOUCH DELICA PLUS LANCET30G) MISC USE TO CHECK BLOOD SUGAR 3 TIMES DAILY 300 each 3   ONETOUCH VERIO test strip USE TO TEST BLOOD GLUCOSE 3  TIMES DAILY 150 strip 6   simvastatin (ZOCOR) 20 MG tablet Take 1 tablet (20 mg total) by mouth at bedtime. 90 tablet 0   tirzepatide (MOUNJARO) 2.5 MG/0.5ML Pen Inject 2.5 mg into the skin once a week. 2 mL 2   triamcinolone cream (KENALOG) 0.1 % APPLY TOPICALLY TO AFFECTED  AREA(S) TWICE DAILY 45 g 2   zaleplon (SONATA) 10 MG capsule Take 2 capsules (20 mg total) by mouth at bedtime as needed for sleep. (Patient not taking: Reported on 10/15/2022) 60 capsule 2   No current facility-administered medications on file prior to visit.    BP (!) 140/100   Pulse 87   Temp 98.2 F (36.8 C) (Oral)   Ht 5\' 11"  (1.803 m)   Wt 239 lb (108.4 kg)   SpO2 98%   BMI 33.33 kg/m       Objective:   Physical Exam Vitals and nursing note reviewed.  Constitutional:       Appearance: Normal appearance.  Cardiovascular:     Pulses: Normal pulses.  Genitourinary:   Musculoskeletal:        General: Normal range of motion.  Skin:    General: Skin is warm and dry.  Neurological:     General: No focal deficit present.     Mental Status: He is alert and oriented to person, place, and time.  Psychiatric:        Mood and Affect: Mood normal.        Behavior: Behavior normal.        Thought Content: Thought content normal.        Judgment: Judgment normal.        Assessment & Plan:  1. Diabetes mellitus without complication (HCC) - Continue with mounjaro 2.5 mg weekly. Will see him back in 2 months for CPE   2. Long-term current use of injectable noninsulin antidiabetic medication   3. Genital warts -Verbal consent obtained.  Using cryotherapy 1 genital wart was frozen using 3 freeze thaw cycles.  Patient tolerated procedure well  Shirline Frees, NP

## 2022-10-31 ENCOUNTER — Encounter: Payer: Self-pay | Admitting: Adult Health

## 2022-10-31 ENCOUNTER — Other Ambulatory Visit: Payer: Self-pay | Admitting: Adult Health

## 2022-10-31 DIAGNOSIS — Z7985 Long-term (current) use of injectable non-insulin antidiabetic drugs: Secondary | ICD-10-CM

## 2022-10-31 DIAGNOSIS — E119 Type 2 diabetes mellitus without complications: Secondary | ICD-10-CM

## 2022-10-31 MED ORDER — FREESTYLE LIBRE 3 PLUS SENSOR MISC: 6 refills | Status: DC

## 2022-10-31 MED ORDER — TIRZEPATIDE 2.5 MG/0.5ML ~~LOC~~ SOAJ: 2.5000 mg | SUBCUTANEOUS | 2 refills | Status: DC

## 2022-11-05 ENCOUNTER — Telehealth: Payer: Self-pay

## 2022-11-05 ENCOUNTER — Other Ambulatory Visit: Payer: Self-pay | Admitting: Adult Health

## 2022-11-05 ENCOUNTER — Other Ambulatory Visit (HOSPITAL_COMMUNITY): Payer: Self-pay

## 2022-11-05 DIAGNOSIS — E119 Type 2 diabetes mellitus without complications: Secondary | ICD-10-CM

## 2022-11-05 NOTE — Telephone Encounter (Signed)
Pharmacy Patient Advocate Encounter   Received notification from Physician's Office that prior authorization for Joseph Barry is required/requested.   Insurance verification completed.   The patient is insured through [facility] .   Per test claim: PA required; PA submitted to above mentioned insurance via CoverMyMeds Key/confirmation #/EOC Key: Hezzie Bump Status is pending

## 2022-11-08 ENCOUNTER — Other Ambulatory Visit (HOSPITAL_COMMUNITY): Payer: Self-pay

## 2022-11-08 NOTE — Telephone Encounter (Signed)
Currently the Prior Authorization is Under an Appeal. The original P/A was initially denied because ''attachments/chartnotes'' werent able to be attached. The Appeal has been marked as Urgent. The status is pending

## 2022-11-08 NOTE — Telephone Encounter (Signed)
Noted  

## 2022-11-12 NOTE — Telephone Encounter (Signed)
Pt stated that he was unable to get his sensor. Pt claimed that the pharmacy had not received the Rx that was sent in 10/245/2024. Tried to call pharmacy but unable to get in contact with a staff member. Will try again later.

## 2022-11-12 NOTE — Telephone Encounter (Signed)
Patient noted of update.

## 2022-11-13 MED ORDER — FREESTYLE LIBRE 3 SENSOR MISC: 6 refills | Status: DC

## 2022-11-13 NOTE — Telephone Encounter (Signed)
Unable to contact pt but a new rx was sent in.

## 2022-11-21 ENCOUNTER — Other Ambulatory Visit (HOSPITAL_COMMUNITY): Payer: Self-pay

## 2022-12-20 ENCOUNTER — Encounter: Payer: Self-pay | Admitting: Adult Health

## 2022-12-20 ENCOUNTER — Other Ambulatory Visit: Payer: Self-pay | Admitting: Adult Health

## 2022-12-20 ENCOUNTER — Ambulatory Visit (INDEPENDENT_AMBULATORY_CARE_PROVIDER_SITE_OTHER): Payer: 59 | Admitting: Adult Health

## 2022-12-20 ENCOUNTER — Other Ambulatory Visit (HOSPITAL_COMMUNITY): Payer: Self-pay

## 2022-12-20 VITALS — BP 130/88 | HR 75 | Temp 98.0°F | Wt 236.0 lb

## 2022-12-20 DIAGNOSIS — Z7985 Long-term (current) use of injectable non-insulin antidiabetic drugs: Secondary | ICD-10-CM

## 2022-12-20 DIAGNOSIS — F5101 Primary insomnia: Secondary | ICD-10-CM

## 2022-12-20 DIAGNOSIS — E119 Type 2 diabetes mellitus without complications: Secondary | ICD-10-CM | POA: Diagnosis not present

## 2022-12-20 DIAGNOSIS — Z0001 Encounter for general adult medical examination with abnormal findings: Secondary | ICD-10-CM | POA: Diagnosis not present

## 2022-12-20 DIAGNOSIS — F329 Major depressive disorder, single episode, unspecified: Secondary | ICD-10-CM

## 2022-12-20 DIAGNOSIS — E782 Mixed hyperlipidemia: Secondary | ICD-10-CM | POA: Diagnosis not present

## 2022-12-20 DIAGNOSIS — Z0000 Encounter for general adult medical examination without abnormal findings: Secondary | ICD-10-CM

## 2022-12-20 DIAGNOSIS — F17200 Nicotine dependence, unspecified, uncomplicated: Secondary | ICD-10-CM

## 2022-12-20 LAB — COMPREHENSIVE METABOLIC PANEL
ALT: 14 U/L (ref 0–53)
AST: 13 U/L (ref 0–37)
Albumin: 4.2 g/dL (ref 3.5–5.2)
Alkaline Phosphatase: 49 U/L (ref 39–117)
BUN: 12 mg/dL (ref 6–23)
CO2: 29 meq/L (ref 19–32)
Calcium: 8.9 mg/dL (ref 8.4–10.5)
Chloride: 105 meq/L (ref 96–112)
Creatinine, Ser: 1.17 mg/dL (ref 0.40–1.50)
GFR: 76.86 mL/min (ref >60.00)
Glucose, Bld: 121 mg/dL — ABNORMAL HIGH (ref 70–99)
Potassium: 4.4 meq/L (ref 3.5–5.1)
Sodium: 142 meq/L (ref 135–145)
Total Bilirubin: 0.4 mg/dL (ref 0.2–1.2)
Total Protein: 6.8 g/dL (ref 6.0–8.3)

## 2022-12-20 LAB — HEMOGLOBIN A1C: Hgb A1c MFr Bld: 6.6 % — ABNORMAL HIGH (ref 4.6–6.5)

## 2022-12-20 LAB — MICROALBUMIN / CREATININE URINE RATIO
Creatinine,U: 331 mg/dL
Microalb Creat Ratio: 0.3 mg/g (ref 0.0–30.0)
Microalb, Ur: 0.9 mg/dL (ref 0.0–1.9)

## 2022-12-20 LAB — CBC
HCT: 45.6 % (ref 39.0–52.0)
Hemoglobin: 15.5 g/dL (ref 13.0–17.0)
MCHC: 33.9 g/dL (ref 30.0–36.0)
MCV: 94.2 fL (ref 78.0–100.0)
Platelets: 357 10*3/uL (ref 150.0–400.0)
RBC: 4.84 Mil/uL (ref 4.22–5.81)
RDW: 14.1 % (ref 11.5–15.5)
WBC: 5.5 10*3/uL (ref 4.0–10.5)

## 2022-12-20 LAB — LIPID PANEL
Cholesterol: 182 mg/dL (ref 0–200)
HDL: 49.9 mg/dL (ref >39.00)
LDL Cholesterol: 114 mg/dL — ABNORMAL HIGH (ref 0–99)
NonHDL: 132.24
Total CHOL/HDL Ratio: 4
Triglycerides: 91 mg/dL (ref 0.0–149.0)
VLDL: 18.2 mg/dL (ref 0.0–40.0)

## 2022-12-20 LAB — TSH: TSH: 0.82 u[IU]/mL (ref 0.35–5.50)

## 2022-12-20 MED ORDER — TIRZEPATIDE 2.5 MG/0.5ML ~~LOC~~ SOAJ: 2.5000 mg | SUBCUTANEOUS | 2 refills | Status: DC

## 2022-12-20 MED ORDER — TIRZEPATIDE 2.5 MG/0.5ML ~~LOC~~ SOAJ: 2.5000 mg | SUBCUTANEOUS | 0 refills | Status: DC

## 2022-12-20 MED ORDER — FREESTYLE LIBRE 3 SENSOR MISC
0 refills | Status: DC
Start: 2022-12-20 — End: 2022-12-25

## 2022-12-20 NOTE — Progress Notes (Signed)
Subjective:    Patient ID: Joseph Barry, male    DOB: March 27, 1980, 42 y.o.   MRN: [id number]  HPI Patient presents for yearly preventative medicine examination. He is a pleasant 42 year old male who  has a past medical history of Depression, Diabetes mellitus type II, uncontrolled, Eczema, Finger laceration involving tendon (05/20/2014), GERD (gastroesophageal reflux disease), History of cardiac murmur as a child, History of MRSA infection (~ 2007), Hypertension, Migraines, and Sleep apnea.  Dm Type 2 - Two months ago we started him back on Mounjaro 2.5 mg for management of diabetes mellitus and obesity.  In the past we had to pull him off Mounjaro due to significant weight loss. He switched jobs and insurance and his insurance denied [place] and also denied his [place] sensor.    Lab Results  Component Value Date   HGBA1C 7.2 (A) 09/18/2022   HGBA1C 6.6 (A) 06/18/2022   HGBA1C 6.3 (A) 03/15/2022   Hyperlipidemia -prescribed simvastatin 20 mg daily.  He denies myalgia or fatigue   Lab Results  Component Value Date   CHOL 167 05/08/2021   HDL 48.60 05/08/2021   LDLCALC 103 (H) 05/08/2021   TRIG 80.0 05/08/2021   CHOLHDL 3 05/08/2021   Insomnia - well managed with Sonata 10 mg at bedtime PRN.   Depression - well controlled with Wellbutrin 150 mg ER.   Tobacco Use- Continues to smoke   All immunizations and health maintenance protocols were reviewed with the patient and needed orders were placed.  Appropriate screening laboratory values were ordered for the patient including screening of hyperlipidemia, renal function and hepatic function.  Medication reconciliation,  past medical history, social history, problem list and allergies were reviewed in detail with the patient  Goals were established with regard to weight loss, exercise, and  diet in compliance with medications Wt Readings from Last 10 Encounters:  12/20/22 236 lb (107 kg)  10/15/22 239 lb (108.4 kg)  09/18/22 242  lb (109.8 kg)  06/18/22 237 lb (107.5 kg)  03/15/22 224 lb (101.6 kg)  02/02/22 200 lb (90.7 kg)  12/07/21 195 lb (88.5 kg)  11/08/21 193 lb (87.5 kg)  06/25/21 193 lb (87.5 kg)  06/13/21 190 lb (86.2 kg)    Review of Systems  Constitutional: Negative.   HENT: Negative.    Eyes: Negative.   Respiratory: Negative.    Cardiovascular: Negative.   Gastrointestinal: Negative.   Endocrine: Negative.   Genitourinary: Negative.   Musculoskeletal: Negative.   Skin: Negative.   Allergic/Immunologic: Negative.   Neurological: Negative.   Hematological: Negative.   Psychiatric/Behavioral: Negative.    All other systems reviewed and are negative.  Past Medical History:  Diagnosis Date   Depression    Diabetes mellitus type II, uncontrolled    Eczema    Finger laceration involving tendon 05/20/2014   right index, long, ring, small fingers - tendon/artery/nerve lac.   GERD (gastroesophageal reflux disease)    History of cardiac murmur as a child    History of MRSA infection ~ 2007   back   Hypertension    states under control with med., has been on med. x 1 yr.   Migraines    Sleep apnea    uses CPAP nightly    Social History   Socioeconomic History   Marital status: Divorced    Spouse name: vanessa   Number of children: Not on file   Years of education: Not on file   Highest education level: Associate degree: occupational,  technical, or vocational program  Occupational History   Not on file  Tobacco Use   Smoking status: Every Day    Types: Cigars   Smokeless tobacco: Never   Tobacco comments:    5 cigars/day  Substance and Sexual Activity   Alcohol use: Yes    Alcohol/week: 0.0 standard drinks of alcohol    Comment: occasionally   Drug use: No   Sexual activity: Not on file  Other Topics Concern   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    Married for five years   Has step daughter.    Has rabbit  and dog.       Lives alone   Right handed   Caffeine: 1 cup of coffee every month   Social Drivers of Health   Financial Resource Strain: Low Risk  (11/07/2021)   Overall Financial Resource Strain (CARDIA)    Difficulty of Paying Living Expenses: Not very hard  Food Insecurity: No Food Insecurity (11/07/2021)   Hunger Vital Sign    Worried About Running Out of Food in the Last Year: Never true    Ran Out of Food in the Last Year: Never true  Transportation Needs: No Transportation Needs (11/07/2021)   PRAPARE - Administrator, Civil Service (Medical): No    Lack of Transportation (Non-Medical): No  Physical Activity: Insufficiently Active (11/07/2021)   Exercise Vital Sign    Days of Exercise per Week: 1 day    Minutes of Exercise per Session: 60 min  Stress: Stress Concern Present (11/07/2021)   [employer] of Occupational Health - Occupational Stress Questionnaire    Feeling of Stress : Very much  Social Connections: Socially Isolated (11/07/2021)   Social Connection and Isolation Panel [NHANES]    Frequency of Communication with Friends and Family: Three times a week    Frequency of Social Gatherings with Friends and Family: Twice a week    Attends Religious Services: Never    Database administrator or Organizations: No    Attends Engineer, structural: Not on file    Marital Status: Separated  Intimate Partner Violence: Unknown (04/10/2021)   Received from [employer], [facility]   HITS    Physically Hurt: Not on file    Insult or Talk Down To: Not on file    Threaten Physical Harm: Not on file    Scream or Curse: Not on file    Past Surgical History:  Procedure Laterality Date   NERVE, TENDON AND ARTERY REPAIR Right 05/24/2014   Procedure: RIGHT INDEX LONG RING SMALL/REPAIR TENDON, ARTERY NERVE;  Surgeon: Betha Loa, MD;  Location: [facility];  Service: Orthopedics;  Laterality: Right;   ORBITAL FRACTURE SURGERY Left 2003     Family History  Problem Relation Age of Onset   Arthritis Maternal Grandmother    Diabetes Maternal Grandmother    Heart disease Maternal Grandmother    Hypertension Father    Diabetes Father    Colon polyps Father    Hypertension Mother    Heart disease Mother    COPD Mother    Cancer Paternal Grandmother        spine cancer   Hypertension Sister     Allergies  Allergen Reactions   Shellfish Allergy Shortness Of Breath and Swelling   Tape     rash   Trazodone And Nefazodone     Priapism  Current Outpatient Medications on File Prior to Visit  Medication Sig Dispense Refill   buPROPion (WELLBUTRIN XL) 150 MG 24 hr tablet Take 1 tablet (150 mg total) by mouth daily. 90 tablet 0   Continuous Glucose Sensor (FREESTYLE LIBRE 3 SENSOR) MISC 1 UNITS BY DOES NOT APPLY ROUTE EVERY 14 (FOURTEEN) DAYS. PLACE 1 SENSOR ON THE SKIN EVERY 14 DAYS. USE TO CHECK GLUCOSE CONTINUOUSLY 2 each 6   Lancets (ONETOUCH DELICA PLUS LANCET30G) MISC USE TO CHECK BLOOD SUGAR 3 TIMES DAILY 300 each 3   ONETOUCH VERIO test strip USE TO TEST BLOOD GLUCOSE 3  TIMES DAILY 150 strip 6   simvastatin (ZOCOR) 20 MG tablet Take 1 tablet (20 mg total) by mouth at bedtime. 90 tablet 0   tirzepatide (MOUNJARO) 2.5 MG/0.5ML Pen Inject 2.5 mg into the skin once a week. 2 mL 2   triamcinolone cream (KENALOG) 0.1 % APPLY TOPICALLY TO AFFECTED  AREA(S) TWICE DAILY 45 g 2   zaleplon (SONATA) 10 MG capsule Take 2 capsules (20 mg total) by mouth at bedtime as needed for sleep. (Patient not taking: Reported on 10/15/2022) 60 capsule 2   No current facility-administered medications on file prior to visit.    BP 130/88   Pulse 75   Temp 98 F (36.7 C)   Wt 236 lb (107 kg)   SpO2 98%   BMI 32.92 kg/m       Objective:   Physical Exam Vitals and nursing note reviewed.  Constitutional:      General: He is not in acute distress.    Appearance: Normal appearance. He is not ill-appearing.  HENT:     Head:  Normocephalic and atraumatic.     Right Ear: Tympanic membrane, ear canal and external ear normal. There is no impacted cerumen.     Left Ear: Tympanic membrane, ear canal and external ear normal. There is no impacted cerumen.     Nose: Nose normal. No congestion or rhinorrhea.     Mouth/Throat:     Mouth: Mucous membranes are moist.     Pharynx: Oropharynx is clear.  Eyes:     Extraocular Movements: Extraocular movements intact.     Conjunctiva/sclera: Conjunctivae normal.     Pupils: Pupils are equal, round, and reactive to light.  Neck:     Vascular: No carotid bruit.  Cardiovascular:     Rate and Rhythm: Normal rate and regular rhythm.     Pulses: Normal pulses.     Heart sounds: No murmur heard.    No friction rub. No gallop.  Pulmonary:     Effort: Pulmonary effort is normal.     Breath sounds: Normal breath sounds.  Abdominal:     General: Abdomen is flat. Bowel sounds are normal. There is no distension.     Palpations: Abdomen is soft. There is no mass.     Tenderness: There is no abdominal tenderness. There is no guarding or rebound.     Hernia: No hernia is present.  Musculoskeletal:        General: Normal range of motion.     Cervical back: Normal range of motion and neck supple.  Lymphadenopathy:     Cervical: No cervical adenopathy.  Skin:    General: Skin is warm and dry.     Capillary Refill: Capillary refill takes less than 2 seconds.  Neurological:     General: No focal deficit present.     Mental Status: He is alert and oriented to  person, place, and time.  Psychiatric:        Mood and Affect: Mood normal.        Behavior: Behavior normal.        Thought Content: Thought content normal.        Judgment: Judgment normal.        Assessment & Plan:  1. Routine general medical examination at a health care facility (Primary) Today patient counseled on age appropriate routine health concerns for screening and prevention, each reviewed and up to date or  declined. Immunizations reviewed and up to date or declined. Labs ordered and reviewed. Risk factors for depression reviewed and negative. Hearing function and visual acuity are intact. ADLs screened and addressed as needed. Functional ability and level of safety reviewed and appropriate. Education, counseling and referrals performed based on assessed risks today. Patient provided with a copy of personalized plan for preventive services. - Declined flu vaccination  - Follow up in one year for CPE  - Needs to quit smoking   2. Diabetes mellitus without complication (HCC) - Will try sending his Greggory Keen to his new mail order. It looks like it is covered that way - Follow up in 3 months  - Lipid panel; Future - TSH; Future - CBC; Future - Comprehensive metabolic panel; Future - Hemoglobin A1c; Future - Microalbumin/Creatinine Ratio, Urine; Future - Continuous Glucose Sensor (FREESTYLE LIBRE 3 SENSOR) MISC; 1 UNITS BY DOES NOT APPLY ROUTE EVERY 14 (FOURTEEN) DAYS. PLACE 1 SENSOR ON THE SKIN EVERY 14 DAYS. USE TO CHECK GLUCOSE CONTINUOUSLY  Dispense: 6 each; Refill: 0 - tirzepatide (MOUNJARO) 2.5 MG/0.5ML Pen; Inject 2.5 mg into the skin once a week.  Dispense: 6 mL; Refill: 0  3. Long-term current use of injectable noninsulin antidiabetic medication  - Lipid panel; Future - TSH; Future - CBC; Future - Comprehensive metabolic panel; Future - Hemoglobin A1c; Future - Microalbumin/Creatinine Ratio, Urine; Future  4. Mixed hyperlipidemia - Consider increase in statin  - Lipid panel; Future - TSH; Future - CBC; Future - Comprehensive metabolic panel; Future  5. Reactive depression - Continue wellbutrin 150 mg XR  - Lipid panel; Future - TSH; Future - CBC; Future - Comprehensive metabolic panel; Future  6. Primary insomnia - Continue with Sonata PRN - Lipid panel; Future - TSH; Future - CBC; Future - Comprehensive metabolic panel; Future  7. Tobacco use disorder - Needs to quit  smoking  - Lipid panel; Future - TSH; Future - CBC; Future - Comprehensive metabolic panel; Future  Shirline Frees, NP

## 2022-12-20 NOTE — Telephone Encounter (Signed)
Noted  

## 2022-12-20 NOTE — Telephone Encounter (Signed)
Approval dates are 11.12.24 to 11.12.25. Cost is around/about 30.00$. Patient can only pick up a 1 MONTH supply at a time.

## 2022-12-24 ENCOUNTER — Other Ambulatory Visit: Payer: Self-pay | Admitting: Adult Health

## 2022-12-24 DIAGNOSIS — E119 Type 2 diabetes mellitus without complications: Secondary | ICD-10-CM

## 2022-12-24 MED ORDER — TIRZEPATIDE 2.5 MG/0.5ML ~~LOC~~ SOAJ: 2.5000 mg | SUBCUTANEOUS | 2 refills | Status: AC

## 2022-12-25 ENCOUNTER — Other Ambulatory Visit: Payer: Self-pay

## 2022-12-25 DIAGNOSIS — E119 Type 2 diabetes mellitus without complications: Secondary | ICD-10-CM

## 2022-12-25 MED ORDER — FREESTYLE LIBRE 3 SENSOR MISC: 0 refills | Status: DC

## 2023-02-27 IMAGING — DX DG CHEST 2V
2 series · 2 of 2 positions shown · non-contrast
Comparison: Chest radiograph April 23, 2017

CLINICAL DATA: Syncope.

EXAM:
CHEST - 2 VIEW

[chest pa]
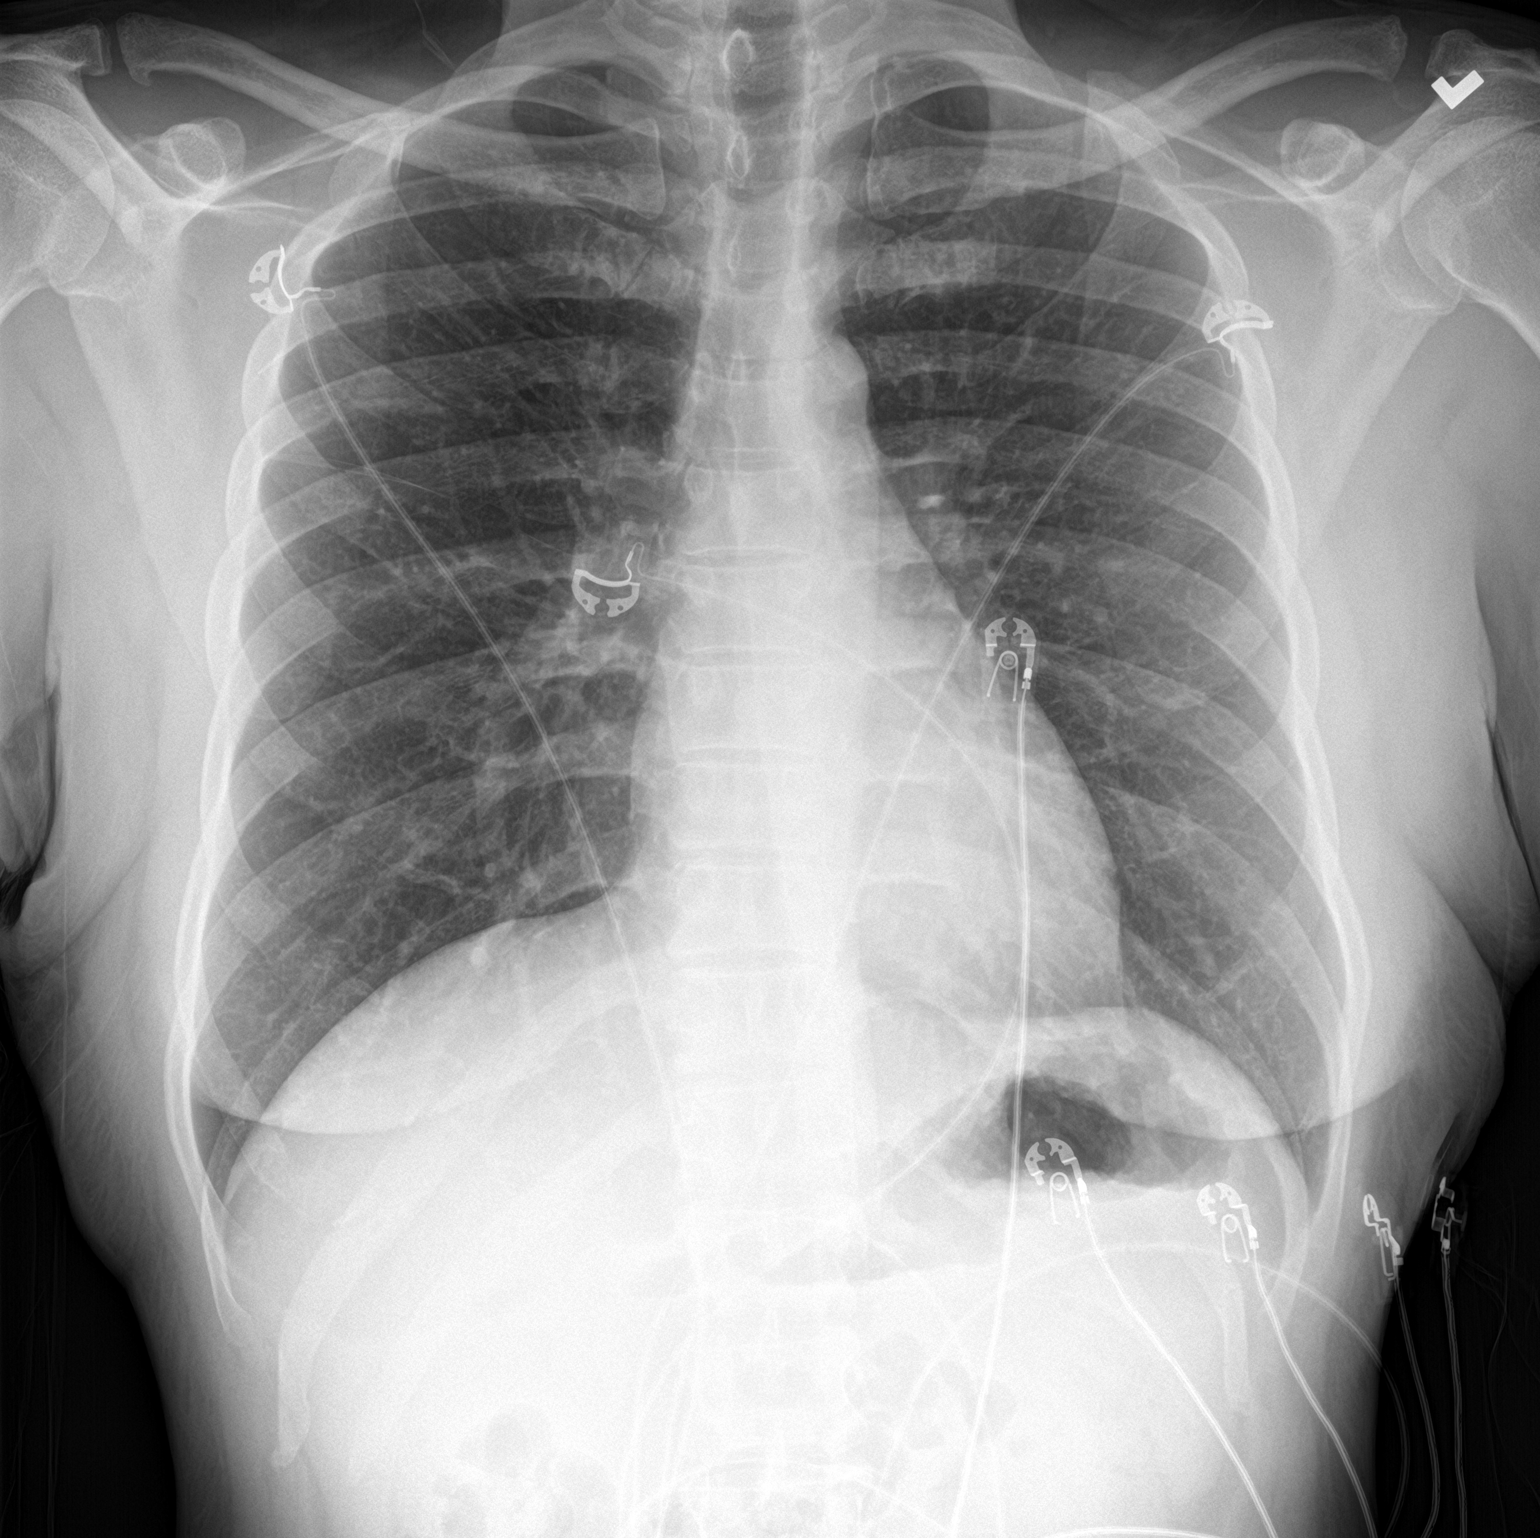

[chest lat]
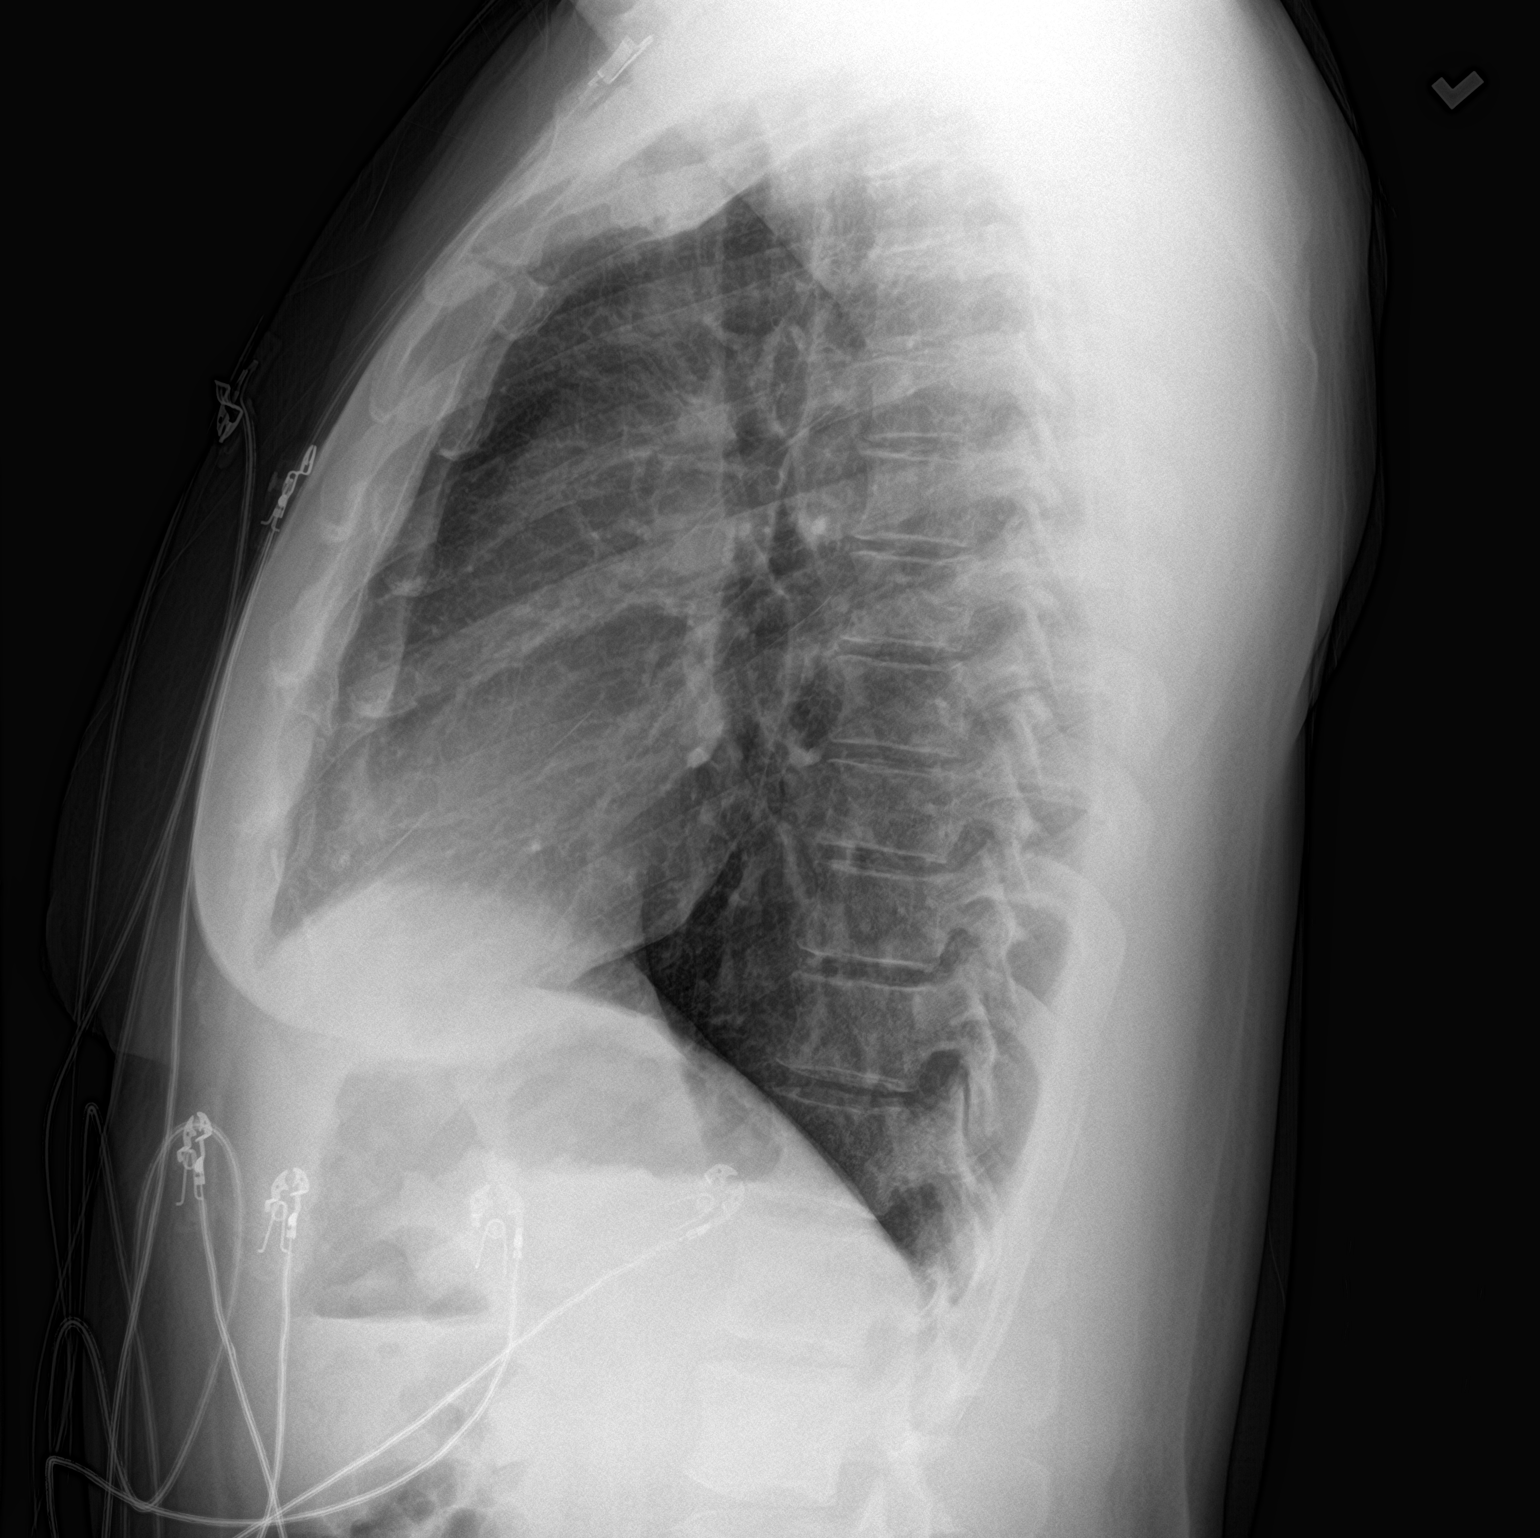

[2 of 2 positions shown; findings below may reference images not displayed]

FINDINGS: The heart size and mediastinal contours are within normal limits. No
focal consolidation. No pleural effusion. No pneumothorax. The
visualized skeletal structures are unremarkable.
IMPRESSION: No acute cardiopulmonary disease.

## 2023-02-27 IMAGING — CT CT HEAD W/O CM
3 series · 14 of 47 positions shown, 16 images · non-contrast
Comparison: None Available.

CLINICAL DATA: Near syncopal episode.



[Series 2: head wo · axial · 0.46mm/px · z∈[+816,+941]mm · 8 of 31 slices shown, 10 images]
[im 3/31  brain]
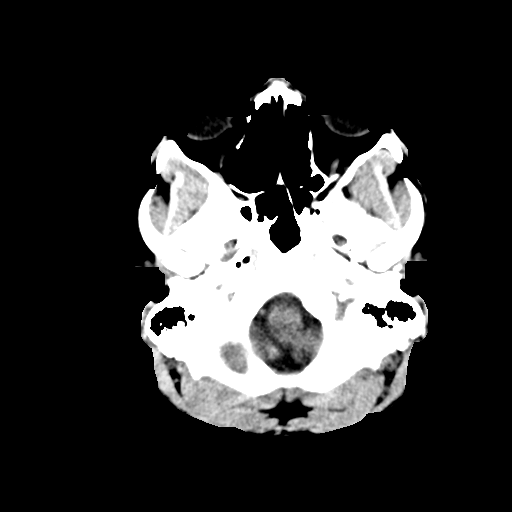
[im 3/31  bone]
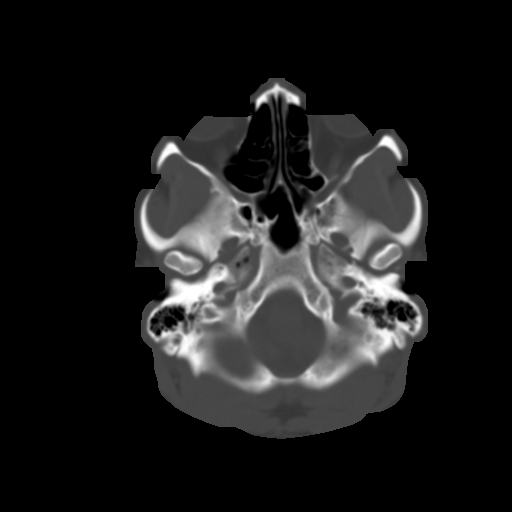
[im 7/31  brain]
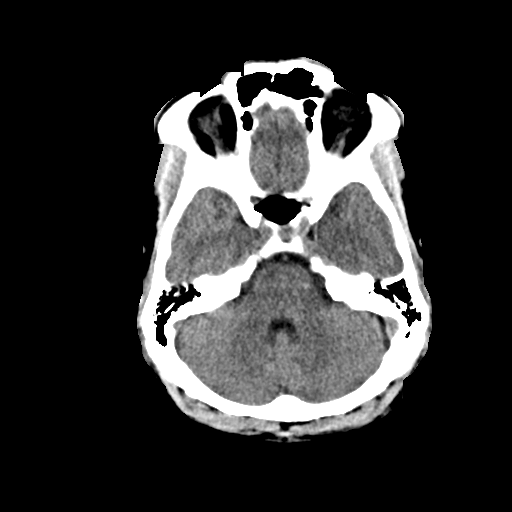
[im 10/31  brain]
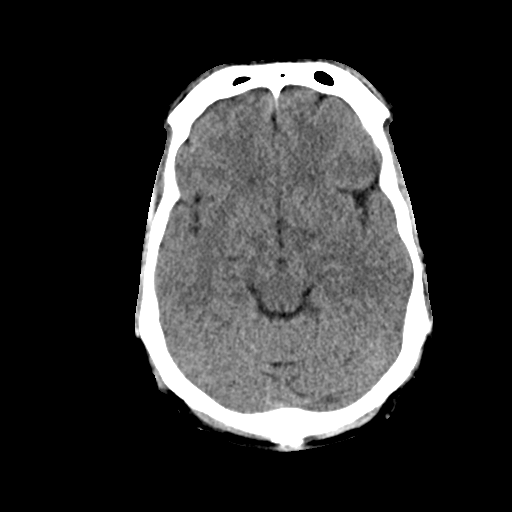
[im 14/31  brain]
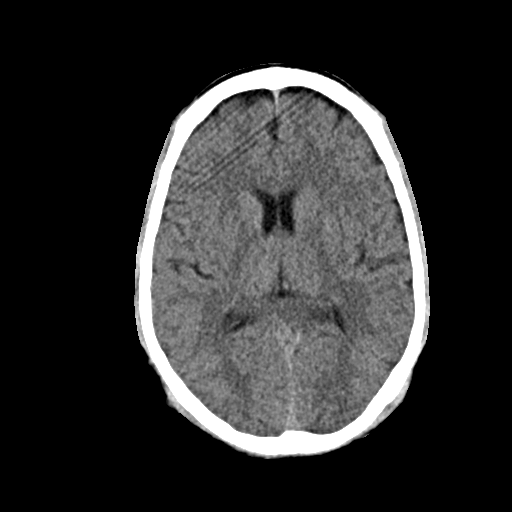
[im 17/31  brain]
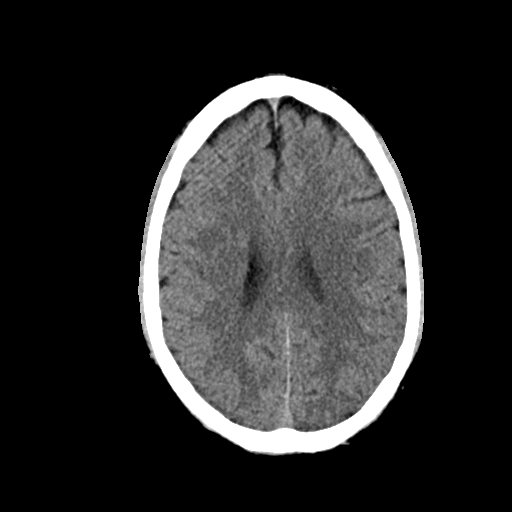
[im 17/31  bone]
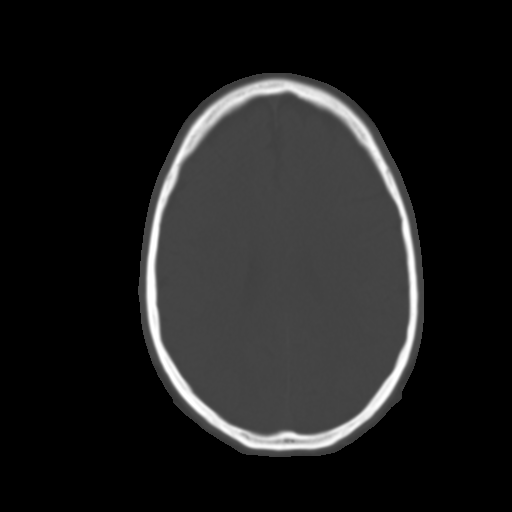
[im 21/31  brain]
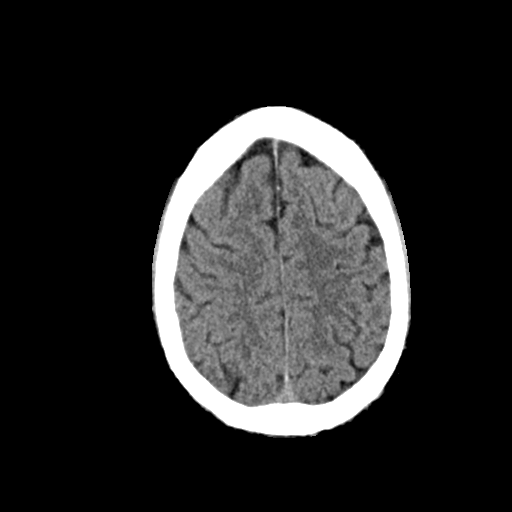
[im 24/31  brain]
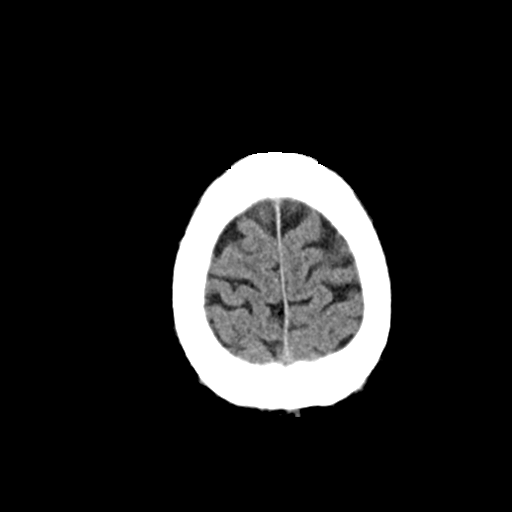
[im 28/31  brain]
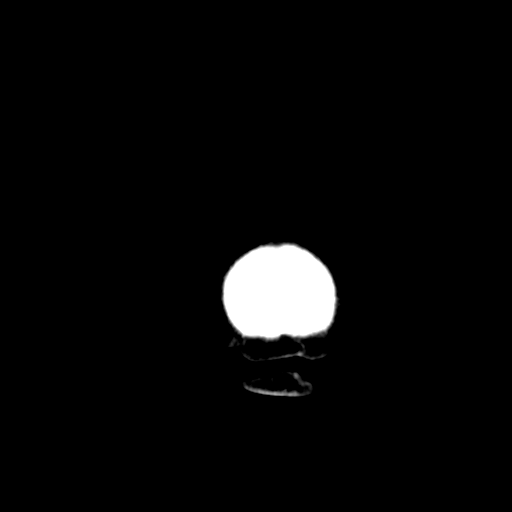

[Series 4: coronal soft · coronal · 0.29mm/px · 3 of 72 slices shown]
[im 24/72  brain]
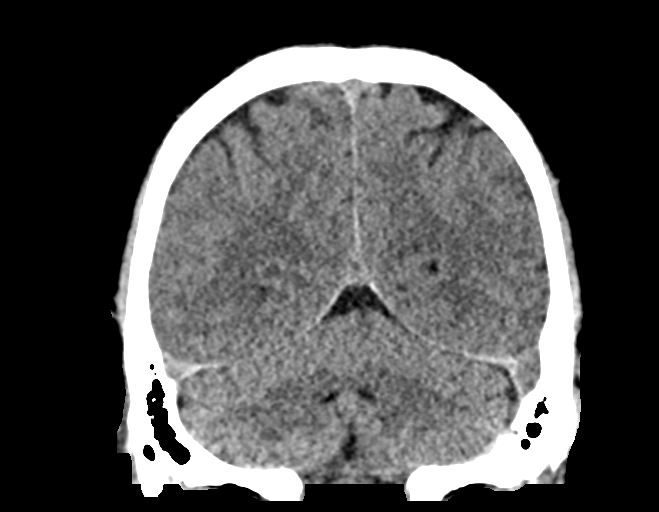
[im 32/72  brain]
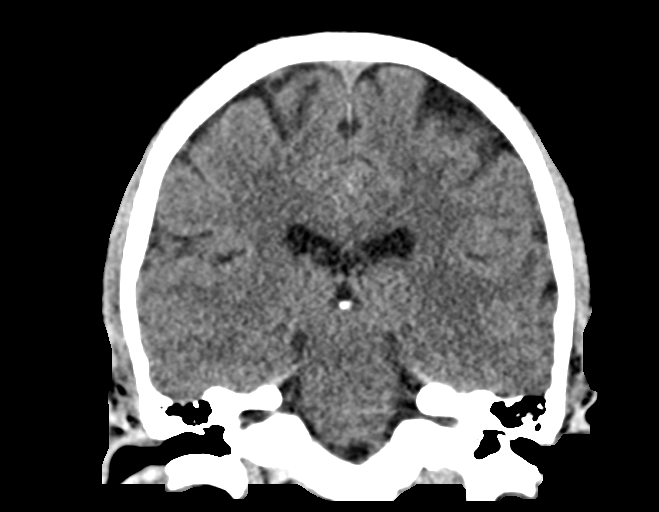
[im 40/72  brain]
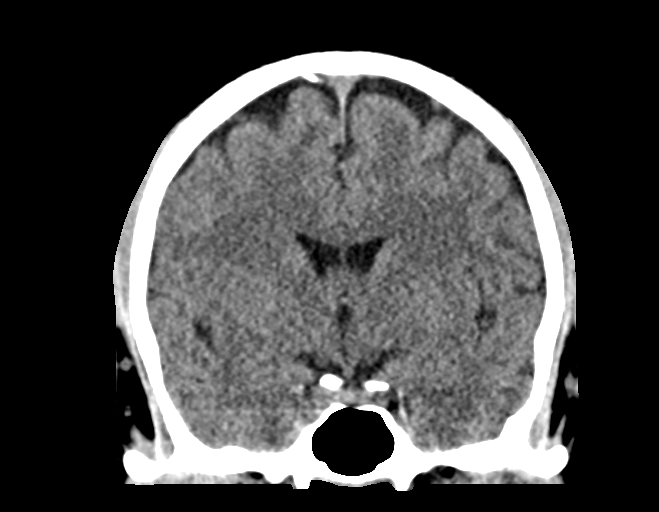

[Series 5: sag soft · sagittal · 0.29mm/px · 3 of 67 slices shown]
[im 23/67  brain]
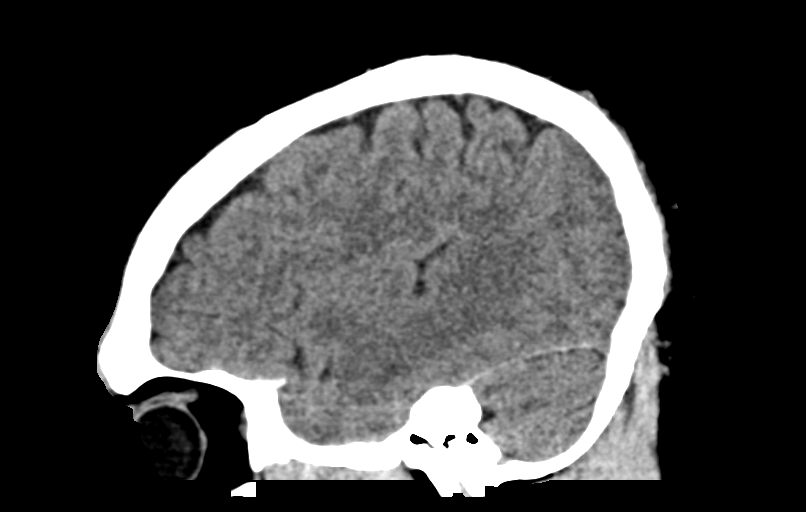
[im 34/67  brain]
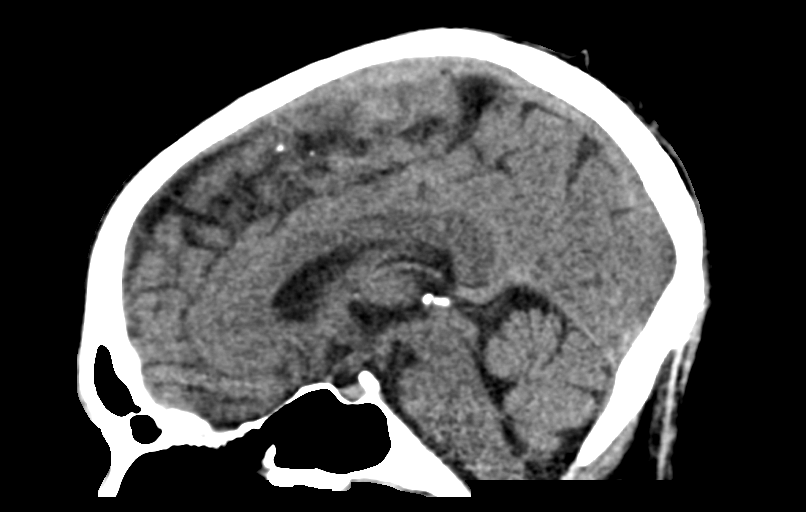
[im 45/67  brain]
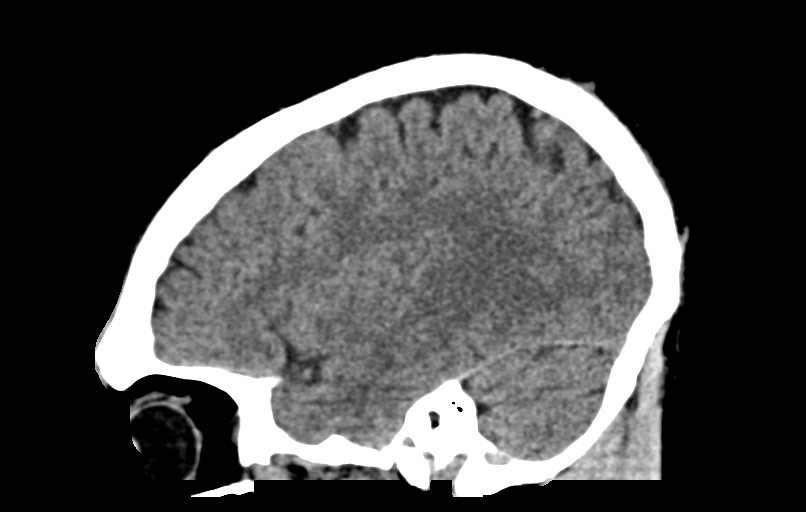

[14 of 47 positions shown; findings below may reference images not displayed]

FINDINGS: Brain: The ventricles are normal in size and configuration. No
extra-axial fluid collections are identified. The gray-white
differentiation is maintained. No CT findings for acute hemispheric
infarction or intracranial hemorrhage. No mass lesions. The
brainstem and cerebellum are normal.

Vascular: No hyperdense vessels or obvious aneurysm.

Skull: No acute skull fracture.  No bone lesion.

Sinuses/Orbits: The paranasal sinuses and mastoid air cells are
clear. The globes are intact.

Other: No scalp lacerations or hematoma. Evidence of prior left
orbital floor fracture with repair.
IMPRESSION: No acute intracranial findings.

## 2023-03-20 ENCOUNTER — Ambulatory Visit (INDEPENDENT_AMBULATORY_CARE_PROVIDER_SITE_OTHER): Payer: 59 | Admitting: Adult Health

## 2023-03-20 VITALS — BP 138/90 | HR 85 | Temp 98.1°F | Ht 71.0 in | Wt 243.0 lb

## 2023-03-20 DIAGNOSIS — E119 Type 2 diabetes mellitus without complications: Secondary | ICD-10-CM

## 2023-03-20 DIAGNOSIS — F17200 Nicotine dependence, unspecified, uncomplicated: Secondary | ICD-10-CM | POA: Diagnosis not present

## 2023-03-20 DIAGNOSIS — E782 Mixed hyperlipidemia: Secondary | ICD-10-CM

## 2023-03-20 DIAGNOSIS — Z7985 Long-term (current) use of injectable non-insulin antidiabetic drugs: Secondary | ICD-10-CM

## 2023-03-20 DIAGNOSIS — E66811 Obesity, class 1: Secondary | ICD-10-CM

## 2023-03-20 LAB — POCT GLYCOSYLATED HEMOGLOBIN (HGB A1C): Hemoglobin A1C: 6.6 % — AB (ref 4.0–5.6)

## 2023-03-20 MED ORDER — TIRZEPATIDE 2.5 MG/0.5ML ~~LOC~~ SOAJ: 2.5000 mg | SUBCUTANEOUS | 0 refills | Status: DC

## 2023-03-20 MED ORDER — SIMVASTATIN 20 MG PO TABS: 20.0000 mg | ORAL_TABLET | Freq: Every day | ORAL | 3 refills | Status: AC

## 2023-03-20 NOTE — Progress Notes (Unsigned)
 Subjective:    Patient ID: Joseph Barry, male    DOB: [birth year], 43 y.o.   MRN: [id number]  Diabetes   43 year old male who  has a past medical history of Depression, Diabetes mellitus type II, uncontrolled, Eczema, Finger laceration involving tendon (05/20/2014), GERD (gastroesophageal reflux disease), History of cardiac murmur as a child, History of MRSA infection (~ 2007), Hypertension, Migraines, and Sleep apnea.  He presents to the office today for follow up regarding DM and obesity   He has been on mounjaro before but had lost too much weight on the higher doses. We decided to try him on 2.5 mg weekly during his last visit. This was sent to his mail order but he reports that he was told the medication was on backorder and never received it; he also did not let us know about this. He has been getting back into the gym and lifting more weights. He tries to eat healthy but is not always successful.  Lab Results  Component Value Date   HGBA1C 6.6 (H) 12/20/2022   HGBA1C 7.2 (A) 09/18/2022   HGBA1C 6.6 (A) 06/18/2022   Obesity - has been working out more often. Diet continues to be up and down  Wt Readings from Last 3 Encounters:  03/20/23 243 lb (110.2 kg)  12/20/22 236 lb (107 kg)  10/15/22 239 lb (108.4 kg)   Tobacco Use - he has not been using Wellbutrin on a consistent basis. He wants to quit and felt better when he had quit for a few months but he is back to smoking.   Review of Systems See HPI   Past Medical History:  Diagnosis Date   Depression    Diabetes mellitus type II, uncontrolled    Eczema    Finger laceration involving tendon 05/20/2014   right index, long, ring, small fingers - tendon/artery/nerve lac.   GERD (gastroesophageal reflux disease)    History of cardiac murmur as a child    History of MRSA infection ~ 2007   back   Hypertension    states under control with med., has been on med. x 1 yr.   Migraines    Sleep apnea    uses CPAP nightly     Social History   Socioeconomic History   Marital status: Divorced    Spouse name: vanessa   Number of children: Not on file   Years of education: Not on file   Highest education level: Associate degree: occupational, Scientist, product/process development, or vocational program  Occupational History   Not on file  Tobacco Use   Smoking status: Every Day    Types: Cigars   Smokeless tobacco: Never   Tobacco comments:    5 cigars/day  Substance and Sexual Activity   Alcohol use: Yes    Alcohol/week: 0.0 standard drinks of alcohol    Comment: occasionally   Drug use: No   Sexual activity: Not on file  Other Topics Concern   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    Married for five years   Has step daughter.    Has rabbit and dog.       Lives alone   Right handed   Caffeine: 1 cup of coffee every month   Social Drivers of Health   Financial Resource Strain: Low Risk  (11/07/2021)   Overall Financial Resource Strain (CARDIA)    Difficulty of Paying Living Expenses: Not very hard  Food Insecurity: No Food Insecurity (11/07/2021)   Hunger Vital Sign    Worried About Running Out of Food in the Last Year: Never true    Ran Out of Food in the Last Year: Never true  Transportation Needs: No Transportation Needs (11/07/2021)   PRAPARE - Administrator, Civil Service (Medical): No    Lack of Transportation (Non-Medical): No  Physical Activity: Insufficiently Active (11/07/2021)   Exercise Vital Sign    Days of Exercise per Week: 1 day    Minutes of Exercise per Session: 60 min  Stress: Stress Concern Present (11/07/2021)   [employer] of Occupational Health - Occupational Stress Questionnaire    Feeling of Stress : Very much  Social Connections: Socially Isolated (11/07/2021)   Social Connection and Isolation Panel [NHANES]    Frequency of Communication with Friends and Family: Three times a week    Frequency of Social  Gatherings with Friends and Family: Twice a week    Attends Religious Services: Never    Database administrator or Organizations: No    Attends Engineer, structural: Not on file    Marital Status: Separated  Intimate Partner Violence: Unknown (04/10/2021)   Received from [facility], [facility]   HITS    Physically Hurt: Not on file    Insult or Talk Down To: Not on file    Threaten Physical Harm: Not on file    Scream or Curse: Not on file    Past Surgical History:  Procedure Laterality Date   NERVE, TENDON AND ARTERY REPAIR Right 05/24/2014   Procedure: RIGHT INDEX LONG RING SMALL/REPAIR TENDON, ARTERY NERVE;  Surgeon: Betha Loa, MD;  Location: [facility];  Service: Orthopedics;  Laterality: Right;   ORBITAL FRACTURE SURGERY Left 2003    Family History  Problem Relation Age of Onset   Arthritis Maternal Grandmother    Diabetes Maternal Grandmother    Heart disease Maternal Grandmother    Hypertension Father    Diabetes Father    Colon polyps Father    Hypertension Mother    Heart disease Mother    COPD Mother    Cancer Paternal Grandmother        spine cancer   Hypertension Sister     Allergies  Allergen Reactions   Shellfish Allergy Shortness Of Breath and Swelling   Tape     rash   Trazodone And Nefazodone     Priapism     Current Outpatient Medications on File Prior to Visit  Medication Sig Dispense Refill   buPROPion (WELLBUTRIN XL) 150 MG 24 hr tablet Take 1 tablet (150 mg total) by mouth daily. 90 tablet 0   Continuous Glucose Sensor (FREESTYLE LIBRE 3 SENSOR) MISC PLACE 1 SENSOR ON THE SKIN EVERY 14 DAYS. USE TO CHECK GLUCOSE CONTINUOUSLY 6 each 0   Lancets (ONETOUCH DELICA PLUS LANCET30G) MISC USE TO CHECK BLOOD SUGAR 3 TIMES DAILY 300 each 3   ONETOUCH VERIO test strip USE TO TEST BLOOD GLUCOSE 3  TIMES DAILY 150 strip 6   simvastatin (ZOCOR) 20 MG tablet Take 1 tablet (20 mg total) by mouth at bedtime. 90 tablet 0    triamcinolone cream (KENALOG) 0.1 % APPLY TOPICALLY TO AFFECTED  AREA(S) TWICE DAILY 45 g 2   zaleplon (SONATA) 10 MG capsule Take 2 capsules (20 mg total) by mouth at bedtime as needed for sleep. (Patient not taking: Reported on 10/15/2022) 60 capsule 2   No current  facility-administered medications on file prior to visit.    BP (!) 138/90   Pulse 85   Temp 98.1 F (36.7 C) (Oral)   Ht 5\' 11"  (1.803 m)   Wt 243 lb (110.2 kg)   SpO2 97%   BMI 33.89 kg/m     Objective:   Physical Exam Vitals and nursing note reviewed.  Constitutional:      Appearance: Normal appearance.  Cardiovascular:     Rate and Rhythm: Normal rate and regular rhythm.     Pulses: Normal pulses.     Heart sounds: Normal heart sounds.  Pulmonary:     Effort: Pulmonary effort is normal.     Breath sounds: Normal breath sounds.  Skin:    General: Skin is warm and dry.  Neurological:     General: No focal deficit present.     Mental Status: He is alert and oriented to person, place, and time.  Psychiatric:        Mood and Affect: Mood normal.        Behavior: Behavior normal.        Thought Content: Thought content normal.        Judgment: Judgment normal.        Assessment & Plan:  1. Diabetes mellitus without complication (HCC) (Primary)  - POC HgB A1c - tirzepatide (MOUNJARO) 2.5 MG/0.5ML Pen; Inject 2.5 mg into the skin once a week.  Dispense: 6 mL; Refill: 0  2. Class 1 obesity - Will order Mounjaro  - Continue to exercise  - Work on diet   3. Mixed hyperlipidemia  - simvastatin (ZOCOR) 20 MG tablet; Take 1 tablet (20 mg total) by mouth at bedtime.  Dispense: 90 tablet; Refill: 3  4. Tobacco use disorder - Encouraged to take Wellbutrin

## 2023-03-21 MED ORDER — TIRZEPATIDE 2.5 MG/0.5ML ~~LOC~~ SOAJ: 2.5000 mg | SUBCUTANEOUS | 0 refills | Status: DC

## 2023-03-21 NOTE — Progress Notes (Signed)
 Rx was faxed to pharmacy due to failed Escribe

## 2023-09-19 ENCOUNTER — Ambulatory Visit: Admitting: Adult Health

## 2023-09-19 VITALS — BP 120/88 | HR 92 | Temp 98.4°F | Ht 71.0 in | Wt 255.0 lb

## 2023-09-19 DIAGNOSIS — H5711 Ocular pain, right eye: Secondary | ICD-10-CM | POA: Diagnosis not present

## 2023-09-19 DIAGNOSIS — F5101 Primary insomnia: Secondary | ICD-10-CM | POA: Diagnosis not present

## 2023-09-19 DIAGNOSIS — R6889 Other general symptoms and signs: Secondary | ICD-10-CM

## 2023-09-19 DIAGNOSIS — E66811 Obesity, class 1: Secondary | ICD-10-CM | POA: Diagnosis not present

## 2023-09-19 DIAGNOSIS — E119 Type 2 diabetes mellitus without complications: Secondary | ICD-10-CM | POA: Diagnosis not present

## 2023-09-19 LAB — POCT GLYCOSYLATED HEMOGLOBIN (HGB A1C): Hemoglobin A1C: 6.7 % — AB (ref 4.0–5.6)

## 2023-09-19 MED ORDER — FREESTYLE LIBRE 3 PLUS SENSOR MISC: 6 refills | Status: AC

## 2023-09-19 MED ORDER — TIRZEPATIDE 5 MG/0.5ML ~~LOC~~ SOAJ: 5.0000 mg | SUBCUTANEOUS | 0 refills | Status: AC

## 2023-09-19 MED ORDER — ZALEPLON 10 MG PO CAPS: 20.0000 mg | ORAL_CAPSULE | Freq: Every evening | ORAL | 2 refills | Status: AC | PRN

## 2023-09-19 NOTE — Patient Instructions (Signed)
 Health Maintenance Due  Topic Date Due   Diabetic kidney evaluation - Urine ACR  Never done   Pneumococcal Vaccine (1 of 2 - PCV) Never done   HPV VACCINES (1 - Risk 3-dose SCDM series) Never done   Hepatitis B Vaccines 19-59 Average Risk (3 of 3 - 19+ 3-dose series) 12/03/2014   Influenza Vaccine  08/08/2023   COVID-19 Vaccine (1 - 2024-25 season) Never done       06/18/2022    5:04 PM 03/15/2022    9:38 AM 11/08/2021    4:10 PM  Depression screen PHQ 2/9  Decreased Interest 2 1 3   Down, Depressed, Hopeless 2 1 2   PHQ - 2 Score 4 2 5   Altered sleeping 1 3 1   Tired, decreased energy 1 3 2   Change in appetite 3 1 3   Feeling bad or failure about yourself  2 1 2   Trouble concentrating 2 0 2  Moving slowly or fidgety/restless 0 0 0  Suicidal thoughts 2 0 0  PHQ-9 Score 15 10 15   Difficult doing work/chores  Not difficult at all Not difficult at all

## 2023-09-19 NOTE — Progress Notes (Signed)
 Subjective:    Patient ID: Joseph Barry, male    DOB: 04-16-1980, 43 y.o.   MRN: [id number]  HPI 43 year old male who  has a past medical history of Depression, Diabetes mellitus type II, uncontrolled, Eczema, Finger laceration involving tendon (05/20/2014), GERD (gastroesophageal reflux disease), History of cardiac murmur as a child, History of MRSA infection (~ 2007), Hypertension, Migraines, and Sleep apnea.  He presents to the office today for follow up regarding DM and obesity   Dm Type 2 - He has been on mounjaro  before but had lost too much weight on the higher doses. Six months ago we placed him back on Mounjaro  2.5 mg weekly. He did not see much improvement with the 2.5 mg dose. He tries to eat healthy but is not always successful. He has not been exercising  Lab Results  Component Value Date   HGBA1C 6.6 (A) 03/20/2023   HGBA1C 6.6 (H) 12/20/2022   HGBA1C 7.2 (A) 09/18/2022   Obesity - has been working out more often. Diet continues to be up and down  Wt Readings from Last 3 Encounters:  09/19/23 255 lb (115.7 kg)  03/20/23 243 lb (110.2 kg)  12/20/22 236 lb (107 kg)   Additionally he reports that over the last 2-1/2 weeks he has had right sided eye pain and light sensitivity without a headache intermittent.  This does not happen every day and the discomfort will last for a few seconds and then resolve.  He does wear glasses, his last eye exam was a year ago or so.  He has not had any blurred vision associated with the discomfort or light sensitivity.    Review of Systems See HPI   Past Medical History:  Diagnosis Date   Depression    Diabetes mellitus type II, uncontrolled    Eczema    Finger laceration involving tendon 05/20/2014   right index, long, ring, small fingers - tendon/artery/nerve lac.   GERD (gastroesophageal reflux disease)    History of cardiac murmur as a child    History of MRSA infection ~ 2007   back   Hypertension    states under control with  med., has been on med. x 1 yr.   Migraines    Sleep apnea    uses CPAP nightly    Social History   Socioeconomic History   Marital status: Divorced    Spouse name: vanessa   Number of children: Not on file   Years of education: Not on file   Highest education level: Associate degree: occupational, Scientist, product/process development, or vocational program  Occupational History   Not on file  Tobacco Use   Smoking status: Every Day    Types: Cigars   Smokeless tobacco: Never   Tobacco comments:    5 cigars/day  Substance and Sexual Activity   Alcohol use: Yes    Alcohol/week: 0.0 standard drinks of alcohol    Comment: occasionally   Drug use: No   Sexual activity: Not on file  Other Topics Concern   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    Married for five years   Has step daughter.    Has rabbit and dog.       Lives alone   Right handed   Caffeine: 1 cup of coffee every month   Social Drivers of Health   Financial Resource Strain: Low Risk  (11/07/2021)   Overall Physicist, medical Strain (  CARDIA)    Difficulty of Paying Living Expenses: Not very hard  Food Insecurity: No Food Insecurity (11/07/2021)   Hunger Vital Sign    Worried About Running Out of Food in the Last Year: Never true    Ran Out of Food in the Last Year: Never true  Transportation Needs: No Transportation Needs (11/07/2021)   PRAPARE - Administrator, Civil Service (Medical): No    Lack of Transportation (Non-Medical): No  Physical Activity: Insufficiently Active (11/07/2021)   Exercise Vital Sign    Days of Exercise per Week: 1 day    Minutes of Exercise per Session: 60 min  Stress: Stress Concern Present (11/07/2021)   [employer] of Occupational Health - Occupational Stress Questionnaire    Feeling of Stress : Very much  Social Connections: Socially Isolated (11/07/2021)   Social Connection and Isolation Panel    Frequency of  Communication with Friends and Family: Three times a week    Frequency of Social Gatherings with Friends and Family: Twice a week    Attends Religious Services: Never    Database administrator or Organizations: No    Attends Engineer, structural: Not on file    Marital Status: Separated  Intimate Partner Violence: Unknown (04/10/2021)   Received from [facility]   HITS    Physically Hurt: Not on file    Insult or Talk Down To: Not on file    Threaten Physical Harm: Not on file    Scream or Curse: Not on file    Past Surgical History:  Procedure Laterality Date   NERVE, TENDON AND ARTERY REPAIR Right 05/24/2014   Procedure: RIGHT INDEX LONG RING SMALL/REPAIR TENDON, ARTERY NERVE;  Surgeon: Franky Curia, MD;  Location: [facility];  Service: Orthopedics;  Laterality: Right;   ORBITAL FRACTURE SURGERY Left 2003    Family History  Problem Relation Age of Onset   Arthritis Maternal Grandmother    Diabetes Maternal Grandmother    Heart disease Maternal Grandmother    Hypertension Father    Diabetes Father    Colon polyps Father    Hypertension Mother    Heart disease Mother    COPD Mother    Cancer Paternal Grandmother        spine cancer   Hypertension Sister     Allergies  Allergen Reactions   Shellfish Allergy Shortness Of Breath and Swelling   Tape     rash   Trazodone  And Nefazodone     Priapism     Current Outpatient Medications on File Prior to Visit  Medication Sig Dispense Refill   buPROPion  (WELLBUTRIN  XL) 150 MG 24 hr tablet Take 1 tablet (150 mg total) by mouth daily. 90 tablet 0   Continuous Glucose Sensor (FREESTYLE LIBRE 3 SENSOR) MISC PLACE 1 SENSOR ON THE SKIN EVERY 14 DAYS. USE TO CHECK GLUCOSE CONTINUOUSLY 6 each 0   Lancets (ONETOUCH DELICA PLUS LANCET30G) MISC USE TO CHECK BLOOD SUGAR 3 TIMES DAILY 300 each 3   ONETOUCH VERIO test strip USE TO TEST BLOOD GLUCOSE 3  TIMES DAILY 150 strip 6   simvastatin  (ZOCOR ) 20 MG tablet Take  1 tablet (20 mg total) by mouth at bedtime. 90 tablet 3   tirzepatide  (MOUNJARO ) 2.5 MG/0.5ML Pen Inject 2.5 mg into the skin once a week. 6 mL 0   triamcinolone  cream (KENALOG ) 0.1 % APPLY TOPICALLY TO AFFECTED  AREA(S) TWICE DAILY 45 g 2   zaleplon  (SONATA ) 10  MG capsule Take 2 capsules (20 mg total) by mouth at bedtime as needed for sleep. 60 capsule 2   No current facility-administered medications on file prior to visit.    BP 120/88   Pulse 92   Temp 98.4 F (36.9 C) (Oral)   Ht 5' 11 (1.803 m)   Wt 255 lb (115.7 kg)   SpO2 97%   BMI 35.57 kg/m       Objective:   Physical Exam Vitals and nursing note reviewed.  Constitutional:      Appearance: Normal appearance.  Cardiovascular:     Rate and Rhythm: Normal rate and regular rhythm.     Pulses: Normal pulses.     Heart sounds: Normal heart sounds.  Pulmonary:     Breath sounds: Normal breath sounds.  Skin:    General: Skin is dry.  Neurological:     General: No focal deficit present.     Mental Status: He is oriented to person, place, and time.  Psychiatric:        Mood and Affect: Mood normal.        Behavior: Behavior normal.        Thought Content: Thought content normal.        Judgment: Judgment normal.        Assessment & Plan:  1. Diabetes mellitus without complication (HCC) (Primary)  - POC HgB A1c- 6.7 - has increased. I will increase his mounjaro  to 5 mg weekly.  - Follow up in 3 months for CPE  - tirzepatide  (MOUNJARO ) 5 MG/0.5ML Pen; Inject 5 mg into the skin once a week.  Dispense: 6 mL; Refill: 0 - Continuous Glucose Sensor (FREESTYLE LIBRE 3 PLUS SENSOR) MISC; Change sensor every 15 days.  Dispense: 2 each; Refill: 6  2. Class 1 obesity - Encouraged to eat healthy and exercise - tirzepatide  (MOUNJARO ) 5 MG/0.5ML Pen; Inject 5 mg into the skin once a week.  Dispense: 6 mL; Refill: 0  3. Primary insomnia - Needs refill  - zaleplon  (SONATA ) 10 MG capsule; Take 2 capsules (20 mg total) by  mouth at bedtime as needed for sleep.  Dispense: 30 capsule; Refill: 2  4. Pain of right eye - Does not sound like trigeminal neuralgia since it is intermittent and only lasts a moment.  - We discussed an MRI of the brain. He is going to schedule an appointment with his eye doctor  5. Light sensitivity  Darleene Shape, NP

## 2023-12-09 ENCOUNTER — Other Ambulatory Visit (HOSPITAL_COMMUNITY): Payer: Self-pay

## 2023-12-09 ENCOUNTER — Telehealth: Payer: Self-pay

## 2023-12-09 NOTE — Telephone Encounter (Signed)
 Patient notified of update  and verbalized understanding.

## 2023-12-09 NOTE — Telephone Encounter (Signed)
 Pharmacy Patient Advocate Encounter   Received notification from Onbase that prior authorization for Mounjaro  5 is required/requested.   Insurance verification completed.   The patient is insured through [facility].   Per test claim: PA required; PA submitted to above mentioned insurance via Latent Key/confirmation #/EOC [id number] Status is pending

## 2023-12-09 NOTE — Telephone Encounter (Signed)
 Pharmacy Patient Advocate Encounter  Received notification from [facility] that Prior Authorization for Mounjaro  5 has been APPROVED from 12/09/23 to 12/08/26. Ran test claim, Copay is $30.00. This test claim was processed through [facility]- copay amounts may vary at other pharmacies due to pharmacy/plan contracts, or as the patient moves through the different stages of their insurance plan.   PA #/Case ID/Reference #: # [id number]

## 2024-01-23 ENCOUNTER — Ambulatory Visit: Admitting: Adult Health

## 2024-01-23 ENCOUNTER — Encounter: Payer: Self-pay | Admitting: Adult Health

## 2024-01-23 VITALS — BP 138/88 | HR 94 | Temp 98.1°F | Ht 71.0 in | Wt 257.0 lb

## 2024-01-23 DIAGNOSIS — Z0000 Encounter for general adult medical examination without abnormal findings: Secondary | ICD-10-CM | POA: Diagnosis not present

## 2024-01-23 DIAGNOSIS — F5101 Primary insomnia: Secondary | ICD-10-CM | POA: Diagnosis not present

## 2024-01-23 DIAGNOSIS — E782 Mixed hyperlipidemia: Secondary | ICD-10-CM | POA: Diagnosis not present

## 2024-01-23 DIAGNOSIS — E083293 Diabetes mellitus due to underlying condition with mild nonproliferative diabetic retinopathy without macular edema, bilateral: Secondary | ICD-10-CM | POA: Diagnosis not present

## 2024-01-23 DIAGNOSIS — Z7985 Long-term (current) use of injectable non-insulin antidiabetic drugs: Secondary | ICD-10-CM

## 2024-01-23 DIAGNOSIS — E66811 Obesity, class 1: Secondary | ICD-10-CM

## 2024-01-23 DIAGNOSIS — F17200 Nicotine dependence, unspecified, uncomplicated: Secondary | ICD-10-CM

## 2024-01-23 MED ORDER — TIRZEPATIDE 5 MG/0.5ML ~~LOC~~ SOAJ: 5.0000 mg | SUBCUTANEOUS | 1 refills | Status: AC

## 2024-01-23 NOTE — Progress Notes (Signed)
 "  Subjective:    Patient ID: Joseph Barry, male    DOB: 26-Oct-1980, 44 y.o.   MRN: [id number]  HPI Patient presents for yearly preventative medicine examination. He is a pleasant 44 year old male who  has a past medical history of Depression, Diabetes mellitus type II, uncontrolled, Eczema, Finger laceration involving tendon (05/20/2014), GERD (gastroesophageal reflux disease), History of cardiac murmur as a child, History of MRSA infection (~ 2007), Hypertension, Migraines, and Sleep apnea.  Dm Type 2 - He has been on mounjaro  before but had lost too much weight on the higher doses. About 9 months a ago we placed him back on Mounjaro  2.5 mg weekly as his A1c and weight were creeping up. He did not see much improvement with the 2.5 mg dose; 3 months ago we increased his dose to 5 mg weekly.  He tries to eat healthy but is not always successful. He has not been exercising and has not been taking Mounjaro  on a constant basis.  Lab Results  Component Value Date   HGBA1C 6.7 (A) 09/19/2023   HGBA1C 6.6 (A) 03/20/2023   HGBA1C 6.6 (H) 12/20/2022   Obesity - He is not exercising but plans on getting back into exercising.  Wt Readings from Last 3 Encounters:  01/23/24 257 lb (116.6 kg)  09/19/23 255 lb (115.7 kg)  03/20/23 243 lb (110.2 kg)   Hyperlipidemia -prescribed simvastatin  20 mg daily.  He denies myalgia or fatigue  Lab Results  Component Value Date   CHOL 182 12/20/2022   HDL 49.90 12/20/2022   LDLCALC 114 (H) 12/20/2022   TRIG 91.0 12/20/2022   CHOLHDL 4 12/20/2022   Insomnia - well managed with Sonata  20 mg at bedtime PRN.   Tobacco Use- Continues to smoke but plans to quit smoking   All immunizations and health maintenance protocols were reviewed with the patient and needed orders were placed.  Appropriate screening laboratory values were ordered for the patient including screening of hyperlipidemia, renal function and hepatic function.  Medication reconciliation,  past  medical history, social history, problem list and allergies were reviewed in detail with the patient  Goals were established with regard to weight loss, exercise, and  diet in compliance with medications   Review of Systems  Constitutional: Negative.   HENT: Negative.    Eyes: Negative.   Respiratory: Negative.    Cardiovascular: Negative.   Gastrointestinal: Negative.   Endocrine: Negative.   Genitourinary: Negative.   Musculoskeletal: Negative.   Skin: Negative.   Allergic/Immunologic: Negative.   Neurological: Negative.   Hematological: Negative.   Psychiatric/Behavioral: Negative.    All other systems reviewed and are negative.  Past Medical History:  Diagnosis Date   Depression    Diabetes mellitus type II, uncontrolled    Eczema    Finger laceration involving tendon 05/20/2014   right index, long, ring, small fingers - tendon/artery/nerve lac.   GERD (gastroesophageal reflux disease)    History of cardiac murmur as a child    History of MRSA infection ~ 2007   back   Hypertension    states under control with med., has been on med. x 1 yr.   Migraines    Sleep apnea    uses CPAP nightly    Social History   Socioeconomic History   Marital status: Divorced    Spouse name: vanessa   Number of children: Not on file   Years of education: Not on file   Highest education level: Associate degree:  occupational, scientist, product/process development, or vocational program  Occupational History   Not on file  Tobacco Use   Smoking status: Every Day    Types: Cigars   Smokeless tobacco: Never   Tobacco comments:    5 cigars/day  Substance and Sexual Activity   Alcohol use: Yes    Alcohol/week: 0.0 standard drinks of alcohol    Comment: occasionally   Drug use: No   Sexual activity: Not on file  Other Topics Concern   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    Married for five years   Has step daughter.    Has rabbit and  dog.       Lives alone   Right handed   Caffeine: 1 cup of coffee every month   Social Drivers of Health   Tobacco Use: High Risk (01/23/2024)   Patient History    Smoking Tobacco Use: Every Day    Smokeless Tobacco Use: Never    Passive Exposure: Not on file  Financial Resource Strain: Low Risk (11/07/2021)   Overall Financial Resource Strain (CARDIA)    Difficulty of Paying Living Expenses: Not very hard  Food Insecurity: No Food Insecurity (11/07/2021)   Hunger Vital Sign    Worried About Running Out of Food in the Last Year: Never true    Ran Out of Food in the Last Year: Never true  Transportation Needs: No Transportation Needs (11/07/2021)   PRAPARE - Administrator, Civil Service (Medical): No    Lack of Transportation (Non-Medical): No  Physical Activity: Insufficiently Active (11/07/2021)   Exercise Vital Sign    Days of Exercise per Week: 1 day    Minutes of Exercise per Session: 60 min  Stress: Stress Concern Present (11/07/2021)   [employer] of Occupational Health - Occupational Stress Questionnaire    Feeling of Stress : Very much  Social Connections: Socially Isolated (11/07/2021)   Social Connection and Isolation Panel    Frequency of Communication with Friends and Family: Three times a week    Frequency of Social Gatherings with Friends and Family: Twice a week    Attends Religious Services: Never    Database Administrator or Organizations: No    Attends Engineer, Structural: Not on file    Marital Status: Separated  Intimate Partner Violence: Unknown (04/10/2021)   Received from [facility]   HITS    Physically Hurt: Not on file    Insult or Talk Down To: Not on file    Threaten Physical Harm: Not on file    Scream or Curse: Not on file  Depression (PHQ2-9): High Risk (06/18/2022)   Depression (PHQ2-9)    PHQ-2 Score: 15  Alcohol Screen: Medium Risk (11/07/2021)   Alcohol Screen    Last Alcohol Screening Score (AUDIT): 8  Housing:  Low Risk (11/07/2021)   Housing    Last Housing Risk Score: 0  Utilities: Not on file  Health Literacy: Not on file    Past Surgical History:  Procedure Laterality Date   NERVE, TENDON AND ARTERY REPAIR Right 05/24/2014   Procedure: RIGHT INDEX LONG RING SMALL/REPAIR TENDON, ARTERY NERVE;  Surgeon: Franky Curia, MD;  Location: [facility];  Service: Orthopedics;  Laterality: Right;   ORBITAL FRACTURE SURGERY Left 2003    Family History  Problem Relation Age of Onset   Arthritis Maternal Grandmother    Diabetes Maternal Grandmother  Heart disease Maternal Grandmother    Hypertension Father    Diabetes Father    Colon polyps Father    Hypertension Mother    Heart disease Mother    COPD Mother    Cancer Paternal Grandmother        spine cancer   Hypertension Sister     Allergies[1]  Medications Ordered Prior to Encounter[2]  BP 138/88   Pulse 94   Temp 98.1 F (36.7 C) (Oral)   Ht 5' 11 (1.803 m)   Wt 257 lb (116.6 kg)   SpO2 98%   BMI 35.84 kg/m       Objective:   Physical Exam Vitals and nursing note reviewed.  Constitutional:      General: He is not in acute distress.    Appearance: Normal appearance. He is obese. He is not ill-appearing.  HENT:     Head: Normocephalic and atraumatic.     Right Ear: Tympanic membrane, ear canal and external ear normal. There is no impacted cerumen.     Left Ear: Tympanic membrane, ear canal and external ear normal. There is no impacted cerumen.     Nose: Nose normal. No congestion or rhinorrhea.     Mouth/Throat:     Mouth: Mucous membranes are moist.     Pharynx: Oropharynx is clear.  Eyes:     Extraocular Movements: Extraocular movements intact.     Conjunctiva/sclera: Conjunctivae normal.     Pupils: Pupils are equal, round, and reactive to light.  Neck:     Vascular: No carotid bruit.  Cardiovascular:     Rate and Rhythm: Normal rate and regular rhythm.     Pulses: Normal pulses.     Heart sounds:  No murmur heard.    No friction rub. No gallop.  Pulmonary:     Effort: Pulmonary effort is normal.     Breath sounds: Normal breath sounds.  Abdominal:     General: Abdomen is flat. Bowel sounds are normal. There is no distension.     Palpations: Abdomen is soft. There is no mass.     Tenderness: There is no abdominal tenderness. There is no guarding or rebound.     Hernia: No hernia is present.  Musculoskeletal:        General: Normal range of motion.     Cervical back: Normal range of motion and neck supple.  Lymphadenopathy:     Cervical: No cervical adenopathy.  Skin:    General: Skin is warm and dry.     Capillary Refill: Capillary refill takes less than 2 seconds.  Neurological:     General: No focal deficit present.     Mental Status: He is alert and oriented to person, place, and time.  Psychiatric:        Mood and Affect: Mood normal.        Behavior: Behavior normal.        Thought Content: Thought content normal.        Judgment: Judgment normal.        Assessment & Plan:  1. Routine general medical examination at a health care facility Today patient counseled on age appropriate routine health concerns for screening and prevention, each reviewed and up to date or declined. Immunizations reviewed and up to date or declined. Labs ordered and reviewed. Risk factors for depression reviewed and negative. Hearing function and visual acuity are intact. ADLs screened and addressed as needed. Functional ability and level of safety reviewed and appropriate.  Education, counseling and referrals performed based on assessed risks today. Patient provided with a copy of personalized plan for preventive services.   2. 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 Long-term current use of injectable noninsulin antidiabetic medication - He would like to stay at 5 mg dose and take it more consistently.  - Follow up in 3-6 months depending on A1c  - Lipid panel; Future - TSH; Future - CBC; Future - Comprehensive metabolic panel with GFR; Future - Microalbumin/Creatinine Ratio, Urine; Future - Hemoglobin A1c; Future - Hemoglobin A1c - Microalbumin/Creatinine Ratio, Urine - Comprehensive metabolic panel with GFR - CBC - TSH - Lipid panel  4. Class 1 obesity - Encouraged to start exercising  - Lipid panel; Future - TSH; Future - CBC; Future - Comprehensive metabolic panel with GFR; Future - Comprehensive metabolic panel with GFR - CBC - TSH - Lipid panel  5. Mixed hyperlipidemia - Consider increase in statin  - Lipid panel; Future - TSH; Future - CBC; Future - Comprehensive metabolic panel with GFR; Future - Comprehensive metabolic panel with GFR - CBC - TSH - Lipid panel  6. Primary insomnia - Continue with Sonata     8. Tobacco use disorder -  Encouraged to quit smoking   Darleene Shape, NP     [1]  Allergies Allergen Reactions   Shellfish Allergy Shortness Of Breath and Swelling   Tape     rash   Trazodone  And Nefazodone     Priapism   [2]  Current Outpatient Medications on File Prior to Visit  Medication Sig Dispense Refill   Continuous Glucose Sensor (FREESTYLE LIBRE 3 PLUS SENSOR) MISC Change sensor every 15 days. 2 each 6   Lancets (ONETOUCH DELICA PLUS LANCET30G) MISC USE TO CHECK BLOOD SUGAR 3 TIMES DAILY 300 each 3   ONETOUCH VERIO test strip USE TO TEST BLOOD GLUCOSE 3  TIMES DAILY 150 strip 6   simvastatin  (ZOCOR ) 20 MG tablet Take 1 tablet (20 mg total) by mouth at bedtime. 90  tablet 3   triamcinolone  cream (KENALOG ) 0.1 % APPLY TOPICALLY TO AFFECTED  AREA(S) TWICE DAILY 45 g 2   zaleplon  (SONATA ) 10 MG capsule Take 2 capsules (20 mg total) by mouth at bedtime as needed for sleep. 30 capsule 2   buPROPion  (WELLBUTRIN  XL) 150 MG 24 hr tablet Take 1 tablet (150 mg total) by mouth daily. (Patient not taking: Reported on 01/23/2024) 90 tablet 0   No current facility-administered medications on file prior to visit.   "

## 2024-01-24 LAB — COMPREHENSIVE METABOLIC PANEL WITH GFR
AG Ratio: 1.8 (calc) (ref 1.0–2.5)
ALT: 16 U/L (ref 9–46)
AST: 14 U/L (ref 10–40)
Albumin: 4.4 g/dL (ref 3.6–5.1)
Alkaline phosphatase (APISO): 53 U/L (ref 36–130)
BUN: 13 mg/dL (ref 7–25)
CO2: 26 mmol/L (ref 20–32)
Calcium: 9.5 mg/dL (ref 8.6–10.3)
Chloride: 100 mmol/L (ref 98–110)
Creat: 1.2 mg/dL (ref 0.60–1.29)
Globulin: 2.4 g/dL (ref 1.9–3.7)
Glucose, Bld: 137 mg/dL — ABNORMAL HIGH (ref 65–99)
Potassium: 4.3 mmol/L (ref 3.5–5.3)
Sodium: 135 mmol/L (ref 135–146)
Total Bilirubin: 0.6 mg/dL (ref 0.2–1.2)
Total Protein: 6.8 g/dL (ref 6.1–8.1)
eGFR: 77 mL/min/1.73m2

## 2024-01-24 LAB — LIPID PANEL
Cholesterol: 198 mg/dL
HDL: 46 mg/dL
LDL Cholesterol (Calc): 131 mg/dL — ABNORMAL HIGH
Non-HDL Cholesterol (Calc): 152 mg/dL — ABNORMAL HIGH
Total CHOL/HDL Ratio: 4.3 (calc)
Triglycerides: 104 mg/dL

## 2024-01-24 LAB — CBC
HCT: 45.2 % (ref 39.4–51.1)
Hemoglobin: 15 g/dL (ref 13.2–17.1)
MCH: 30.5 pg (ref 27.0–33.0)
MCHC: 33.2 g/dL (ref 31.6–35.4)
MCV: 92.1 fL (ref 81.4–101.7)
MPV: 8.8 fL (ref 7.5–12.5)
Platelets: 416 Thousand/uL — ABNORMAL HIGH (ref 140–400)
RBC: 4.91 Million/uL (ref 4.20–5.80)
RDW: 13 % (ref 11.0–15.0)
WBC: 8.7 Thousand/uL (ref 3.8–10.8)

## 2024-01-24 LAB — MICROALBUMIN / CREATININE URINE RATIO
Creatinine, Urine: 142 mg/dL (ref 20–320)
Microalb, Ur: <0.2 mg/dL

## 2024-01-24 LAB — TSH: TSH: 0.99 m[IU]/L (ref 0.40–4.50)

## 2024-01-24 LAB — HEMOGLOBIN A1C
Hgb A1c MFr Bld: 7.4 % — ABNORMAL HIGH
Mean Plasma Glucose: 166 mg/dL
eAG (mmol/L): 9.2 mmol/L

## 2024-01-27 ENCOUNTER — Ambulatory Visit: Payer: Self-pay | Admitting: Adult Health
